# Patient Record
Sex: Female | Born: 1950 | Race: White | Hispanic: No | Marital: Married | Smoking: Former smoker
Health system: Southern US, Community
[De-identification: ages and names within clinical notes are randomized; demographics above are authoritative.]

## PROBLEM LIST (undated history)

## (undated) DIAGNOSIS — E785 Hyperlipidemia, unspecified: Secondary | ICD-10-CM

## (undated) DIAGNOSIS — M1990 Unspecified osteoarthritis, unspecified site: Secondary | ICD-10-CM

## (undated) DIAGNOSIS — I219 Acute myocardial infarction, unspecified: Secondary | ICD-10-CM

## (undated) DIAGNOSIS — J449 Chronic obstructive pulmonary disease, unspecified: Secondary | ICD-10-CM

## (undated) DIAGNOSIS — M109 Gout, unspecified: Secondary | ICD-10-CM

## (undated) DIAGNOSIS — N200 Calculus of kidney: Secondary | ICD-10-CM

## (undated) DIAGNOSIS — I10 Essential (primary) hypertension: Secondary | ICD-10-CM

## (undated) DIAGNOSIS — I4891 Unspecified atrial fibrillation: Secondary | ICD-10-CM

## (undated) DIAGNOSIS — R42 Dizziness and giddiness: Secondary | ICD-10-CM

## (undated) DIAGNOSIS — Z951 Presence of aortocoronary bypass graft: Secondary | ICD-10-CM

## (undated) DIAGNOSIS — I509 Heart failure, unspecified: Secondary | ICD-10-CM

## (undated) DIAGNOSIS — F419 Anxiety disorder, unspecified: Secondary | ICD-10-CM

## (undated) DIAGNOSIS — E669 Obesity, unspecified: Secondary | ICD-10-CM

## (undated) DIAGNOSIS — I2510 Atherosclerotic heart disease of native coronary artery without angina pectoris: Secondary | ICD-10-CM

## (undated) DIAGNOSIS — E119 Type 2 diabetes mellitus without complications: Secondary | ICD-10-CM

## (undated) HISTORY — DX: Heart failure, unspecified: I50.9

## (undated) HISTORY — DX: Obesity, unspecified: E66.9

## (undated) HISTORY — DX: Presence of aortocoronary bypass graft: Z95.1

## (undated) HISTORY — DX: Unspecified osteoarthritis, unspecified site: M19.90

## (undated) HISTORY — DX: Acute myocardial infarction, unspecified: I21.9

## (undated) HISTORY — DX: Dizziness and giddiness: R42

## (undated) HISTORY — DX: Chronic obstructive pulmonary disease, unspecified: J44.9

## (undated) HISTORY — PX: TUBAL LIGATION: SHX77

## (undated) HISTORY — DX: Essential (primary) hypertension: I10

## (undated) HISTORY — DX: Type 2 diabetes mellitus without complications: E11.9

## (undated) HISTORY — DX: Unspecified atrial fibrillation: I48.91

## (undated) HISTORY — DX: Hyperlipidemia, unspecified: E78.5

## (undated) HISTORY — DX: Atherosclerotic heart disease of native coronary artery without angina pectoris: I25.10

## (undated) HISTORY — PX: OTHER SURGICAL HISTORY: SHX169

## (undated) HISTORY — DX: Anxiety disorder, unspecified: F41.9

## (undated) HISTORY — DX: Calculus of kidney: N20.0

---

## 1999-09-08 ENCOUNTER — Inpatient Hospital Stay (HOSPITAL_COMMUNITY): Admission: EM | Admit: 1999-09-08 | Discharge: 1999-09-11 | Payer: Self-pay | Admitting: *Deleted

## 1999-09-08 ENCOUNTER — Encounter: Payer: Self-pay | Admitting: *Deleted

## 1999-12-01 ENCOUNTER — Encounter: Payer: Self-pay | Admitting: Emergency Medicine

## 1999-12-02 ENCOUNTER — Inpatient Hospital Stay (HOSPITAL_COMMUNITY): Admission: EM | Admit: 1999-12-02 | Discharge: 1999-12-04 | Payer: Self-pay | Admitting: Emergency Medicine

## 2000-11-13 ENCOUNTER — Encounter: Payer: Self-pay | Admitting: Family Medicine

## 2000-11-13 ENCOUNTER — Encounter: Admission: RE | Admit: 2000-11-13 | Discharge: 2000-11-13 | Payer: Self-pay | Admitting: Family Medicine

## 2001-12-14 ENCOUNTER — Inpatient Hospital Stay (HOSPITAL_COMMUNITY): Admission: EM | Admit: 2001-12-14 | Discharge: 2001-12-25 | Payer: Self-pay | Admitting: Emergency Medicine

## 2001-12-14 ENCOUNTER — Encounter: Payer: Self-pay | Admitting: Emergency Medicine

## 2001-12-15 HISTORY — PX: CORONARY ARTERY BYPASS GRAFT: SHX141

## 2001-12-20 ENCOUNTER — Encounter: Payer: Self-pay | Admitting: Surgery

## 2001-12-21 ENCOUNTER — Encounter: Payer: Self-pay | Admitting: Surgery

## 2001-12-22 ENCOUNTER — Encounter: Payer: Self-pay | Admitting: Surgery

## 2002-02-20 ENCOUNTER — Encounter: Payer: Self-pay | Admitting: Emergency Medicine

## 2002-02-20 ENCOUNTER — Emergency Department (HOSPITAL_COMMUNITY): Admission: EM | Admit: 2002-02-20 | Discharge: 2002-02-20 | Payer: Self-pay | Admitting: Emergency Medicine

## 2002-11-05 ENCOUNTER — Emergency Department (HOSPITAL_COMMUNITY): Admission: EM | Admit: 2002-11-05 | Discharge: 2002-11-05 | Payer: Self-pay | Admitting: Emergency Medicine

## 2002-11-05 ENCOUNTER — Encounter: Payer: Self-pay | Admitting: Emergency Medicine

## 2003-06-16 ENCOUNTER — Other Ambulatory Visit: Admission: RE | Admit: 2003-06-16 | Discharge: 2003-06-16 | Payer: Self-pay | Admitting: Obstetrics and Gynecology

## 2003-07-05 ENCOUNTER — Encounter (INDEPENDENT_AMBULATORY_CARE_PROVIDER_SITE_OTHER): Payer: Self-pay

## 2003-07-05 ENCOUNTER — Ambulatory Visit (HOSPITAL_COMMUNITY): Admission: RE | Admit: 2003-07-05 | Discharge: 2003-07-05 | Payer: Self-pay | Admitting: Obstetrics and Gynecology

## 2007-09-27 ENCOUNTER — Inpatient Hospital Stay (HOSPITAL_COMMUNITY): Admission: EM | Admit: 2007-09-27 | Discharge: 2007-09-28 | Payer: Self-pay | Admitting: Emergency Medicine

## 2007-09-27 ENCOUNTER — Ambulatory Visit: Payer: Self-pay | Admitting: Cardiology

## 2009-01-29 ENCOUNTER — Inpatient Hospital Stay (HOSPITAL_COMMUNITY): Admission: EM | Admit: 2009-01-29 | Discharge: 2009-01-31 | Payer: Self-pay | Admitting: Emergency Medicine

## 2009-01-29 ENCOUNTER — Ambulatory Visit: Payer: Self-pay | Admitting: Cardiology

## 2009-01-30 ENCOUNTER — Encounter (INDEPENDENT_AMBULATORY_CARE_PROVIDER_SITE_OTHER): Payer: Self-pay | Admitting: Internal Medicine

## 2009-12-05 ENCOUNTER — Emergency Department (HOSPITAL_COMMUNITY): Admission: EM | Admit: 2009-12-05 | Discharge: 2009-12-05 | Payer: Self-pay | Admitting: Emergency Medicine

## 2010-10-22 ENCOUNTER — Inpatient Hospital Stay (HOSPITAL_COMMUNITY)
Admission: EM | Admit: 2010-10-22 | Discharge: 2010-10-25 | Payer: Self-pay | Source: Home / Self Care | Admitting: Emergency Medicine

## 2010-10-22 ENCOUNTER — Ambulatory Visit: Payer: Self-pay | Admitting: Cardiology

## 2010-10-23 ENCOUNTER — Encounter: Payer: Self-pay | Admitting: Cardiology

## 2010-10-23 ENCOUNTER — Ambulatory Visit: Payer: Self-pay | Admitting: Vascular Surgery

## 2010-10-28 ENCOUNTER — Ambulatory Visit: Payer: Self-pay | Admitting: Cardiology

## 2010-10-30 ENCOUNTER — Telehealth: Payer: Self-pay | Admitting: Cardiology

## 2010-10-30 LAB — CONVERTED CEMR LAB
Chloride: 104 meq/L (ref 96–112)
GFR calc non Af Amer: 50.72 mL/min (ref >60)
Glucose, Bld: 71 mg/dL (ref 70–99)
Potassium: 4.3 meq/L (ref 3.5–5.1)
Sodium: 139 meq/L (ref 135–145)

## 2010-11-12 ENCOUNTER — Encounter: Payer: Self-pay | Admitting: Cardiology

## 2010-11-27 DIAGNOSIS — I10 Essential (primary) hypertension: Secondary | ICD-10-CM

## 2010-11-27 DIAGNOSIS — R42 Dizziness and giddiness: Secondary | ICD-10-CM

## 2010-11-27 DIAGNOSIS — E669 Obesity, unspecified: Secondary | ICD-10-CM

## 2010-11-27 DIAGNOSIS — E785 Hyperlipidemia, unspecified: Secondary | ICD-10-CM | POA: Insufficient documentation

## 2010-11-27 DIAGNOSIS — J441 Chronic obstructive pulmonary disease with (acute) exacerbation: Secondary | ICD-10-CM

## 2010-11-27 DIAGNOSIS — E1129 Type 2 diabetes mellitus with other diabetic kidney complication: Secondary | ICD-10-CM

## 2010-11-27 DIAGNOSIS — I4891 Unspecified atrial fibrillation: Secondary | ICD-10-CM | POA: Insufficient documentation

## 2010-11-27 DIAGNOSIS — I2510 Atherosclerotic heart disease of native coronary artery without angina pectoris: Secondary | ICD-10-CM

## 2010-11-27 DIAGNOSIS — M549 Dorsalgia, unspecified: Secondary | ICD-10-CM

## 2010-11-27 DIAGNOSIS — R0602 Shortness of breath: Secondary | ICD-10-CM

## 2010-11-27 DIAGNOSIS — F411 Generalized anxiety disorder: Secondary | ICD-10-CM

## 2010-11-27 DIAGNOSIS — I5023 Acute on chronic systolic (congestive) heart failure: Secondary | ICD-10-CM

## 2010-11-27 DIAGNOSIS — I219 Acute myocardial infarction, unspecified: Secondary | ICD-10-CM | POA: Insufficient documentation

## 2010-11-28 ENCOUNTER — Ambulatory Visit: Payer: Self-pay | Admitting: Cardiology

## 2011-01-16 NOTE — Progress Notes (Signed)
Summary: pt returned call  Phone Note Call from Patient   Caller: Patient [phone] Reason for Call: Talk to Nurse Summary of Call: pt returned call Initial call taken by: Glynda Jaeger,  October 30, 2010 11:38 AM  Follow-up for Phone Call        Phone Call Completed PT AWARE OF BMET RESULTS. Follow-up by: Scherrie Bateman, LPN,  October 30, 2010 11:55 AM

## 2011-01-27 ENCOUNTER — Encounter (INDEPENDENT_AMBULATORY_CARE_PROVIDER_SITE_OTHER): Payer: Medicaid Other | Admitting: Cardiology

## 2011-01-27 ENCOUNTER — Encounter: Payer: Self-pay | Admitting: Cardiology

## 2011-01-27 ENCOUNTER — Other Ambulatory Visit: Payer: Self-pay | Admitting: Cardiology

## 2011-01-27 DIAGNOSIS — E7800 Pure hypercholesterolemia, unspecified: Secondary | ICD-10-CM

## 2011-01-27 DIAGNOSIS — I10 Essential (primary) hypertension: Secondary | ICD-10-CM

## 2011-01-27 DIAGNOSIS — E785 Hyperlipidemia, unspecified: Secondary | ICD-10-CM

## 2011-01-27 DIAGNOSIS — I679 Cerebrovascular disease, unspecified: Secondary | ICD-10-CM | POA: Insufficient documentation

## 2011-01-27 DIAGNOSIS — I2510 Atherosclerotic heart disease of native coronary artery without angina pectoris: Secondary | ICD-10-CM

## 2011-01-28 LAB — HEPATIC FUNCTION PANEL
Albumin: 3.5 g/dL (ref 3.5–5.2)
Total Bilirubin: 0.6 mg/dL (ref 0.3–1.2)

## 2011-01-28 LAB — LIPID PANEL
HDL: 36.3 mg/dL — ABNORMAL LOW (ref >39.00)
LDL Cholesterol: 89 mg/dL (ref 0–99)
Total CHOL/HDL Ratio: 4
Triglycerides: 150 mg/dL — ABNORMAL HIGH (ref 0.0–149.0)

## 2011-01-28 LAB — BASIC METABOLIC PANEL
CO2: 29 mEq/L (ref 19–32)
Chloride: 106 mEq/L (ref 96–112)
Creatinine, Ser: 1.3 mg/dL — ABNORMAL HIGH (ref 0.4–1.2)
Glucose, Bld: 82 mg/dL (ref 70–99)

## 2011-02-05 NOTE — Assessment & Plan Note (Signed)
Summary: eph.gd/per Bjorn Loser pa/gd rs per pt call-mb-mj/d.miller   CC:  follow up .  History of Present Illness: 60 year old female with past medical history of coronary artery disease status post coronary bypass graft and atrial fibrillation for followup. Patient was admitted in Nov 2011 with atrial fibrillation with a rapid ventricular response. Troponin mildly increased. TSH normal. Echocardiogram in November of 2011 showed an ejection fraction of 45-50% with mild mitral regurgitation. Cardiac catheterization in Nov 2011 revealed an ejection fraction of 50%, severe three-vessel coronary artery disease status post 6-vessel coronary artery bypass graft with 6 patent bypass grafts. Severe disease in the proximal portion of the previously stented circumflex vessel that is unchanged from films in 2008 but is unprotected by a vein graft. Medical therapy was recommended. She had carotid Dopplers performed in Nov 2011, which showed right ICA 60- 79% stenosis at the upper end of the range and left ICA 60-79% stenosis at the lower end of the range. The patient declined Coumadin because of financial issues. Since discharge the patient denies any dyspnea on exertion, orthopnea, PND, pedal edema, palpitations, syncope or chest pain.   Current Medications (verified): 1)  Metoprolol Tartrate 50 Mg Tabs (Metoprolol Tartrate) .Marland Kitchen.. 1 1/2  Tab By Mouth Once Daily 2)  Glipizide 5 Mg Tabs (Glipizide) .... 2  Tabs By Mouth Two Times A Day 3)  Enalapril Maleate 20 Mg Tabs (Enalapril Maleate) .Marland Kitchen.. 1 Tab By Mouth Once Daily 4)  Lovastatin 20 Mg Tabs (Lovastatin) .... Take One Tablet By Mouth Daily At Bedtime 5)  Aspirin Ec 325 Mg Tbec (Aspirin) .... Take One Tablet By Mouth Daily 6)  Multivitamins   Tabs (Multiple Vitamin) .Marland Kitchen.. 1  Tab By Mouth Once Daily 7)  Furosemide 40 Mg Tabs (Furosemide) .... 1/2 To 1 Tab By Mouth Once Daily  Past History:  Past Medical History: CAD  ATRIAL FIBRILLATION   CHF HYPERLIPIDEMIA HYPERTENSION  MI  DM  COPD VERTIGO  ANXIETY  OBESITY  Past Surgical History: Reviewed history from 11/27/2010 and no changes required.  Status post coronary artery bypass graft x6, January 2003,  left internal mammary artery to left anterior descending, vein graft to the diagonal, sequential vein graft to the obtuse  marginal 1, obtuse marginal 2, and obtuse marginal  3, vein graft   to the posterior descending artery.   .Status post dilation and curettage.   Social History: Reviewed history from 11/27/2010 and no changes required.  She lives in [place] with her husband.  She does not   currently work.  She has a 30-pack-year history of tobacco abuse,   quitting in the late 1990s.  She denies alcohol or drugs.  Not routinely   exercising.   Review of Systems       no fevers or chills, productive cough, hemoptysis, dysphasia, odynophagia, melena, hematochezia, dysuria, hematuria, rash, seizure activity, orthopnea, PND, pedal edema, claudication. Remaining systems are negative.   Vital Signs:  Patient profile:   60 year old female Height:      67 inches Weight:      249 pounds BMI:     39.14 Pulse rate:   60 / minute Resp:     14 per minute BP sitting:   134 / 80  (left arm)  Vitals Entered By: Kem Parkinson (January 27, 2011 2:47 PM)  Physical Exam  General:  Well-developed obese in no acute distress.  Skin is warm and dry.  HEENT is normal.  Neck is supple. No thyromegaly.  Chest  is clear to auscultation with normal expansion.  Cardiovascular exam is regular rate and rhythm.  Abdominal exam nontender or distended. No masses palpated. Extremities show no edema. neuro grossly intact    EKG  Procedure date:  01/27/2011  Findings:      Sinus rhythm at a rate of 64. Axis normal. Nonspecific ST changes. Cannot rule out prior septal infarct.  Impression & Recommendations:  Problem # 1:  CAD (ICD-414.00) Continue aspirin, beta  blocker, ACE inhibitor and statin. Her updated medication list for this problem includes:    Metoprolol Tartrate 50 Mg Tabs (Metoprolol tartrate) .Marland Kitchen... 1 1/2  tab by mouth once daily    Enalapril Maleate 20 Mg Tabs (Enalapril maleate) .Marland Kitchen... 1 tab by mouth once daily    Aspirin Ec 325 Mg Tbec (Aspirin) .Marland Kitchen... Take one tablet by mouth daily  Her updated medication list for this problem includes:    Metoprolol Tartrate 50 Mg Tabs (Metoprolol tartrate) .Marland Kitchen... 1 1/2  tab by mouth once daily    Enalapril Maleate 20 Mg Tabs (Enalapril maleate) .Marland Kitchen... 1 tab by mouth once daily    Aspirin Ec 325 Mg Tbec (Aspirin) .Marland Kitchen... Take one tablet by mouth daily  Problem # 2:  ATRIAL FIBRILLATION (ICD-427.31) Patient remains in sinus rhythm. Continue beta blocker and aspirin. I have recommended Coumadin and she declines. She understands the risk of an embolic CVA. Her updated medication list for this problem includes:    Metoprolol Tartrate 50 Mg Tabs (Metoprolol tartrate) .Marland Kitchen... 1 1/2  tab by mouth once daily    Aspirin Ec 325 Mg Tbec (Aspirin) .Marland Kitchen... Take one tablet by mouth daily  Problem # 3:  HYPERLIPIDEMIA (ICD-272.4) Continue statin. Check lipids and liver. Her updated medication list for this problem includes:    Lovastatin 20 Mg Tabs (Lovastatin) .Marland Kitchen... Take one tablet by mouth daily at bedtime  Orders: TLB-Lipid Panel (80061-LIPID) TLB-Hepatic/Liver Function Pnl (80076-HEPATIC)  Problem # 4:  HYPERTENSION (ICD-401.9) Blood pressure controlled. Continue present medications. Check potassium and renal function. Her updated medication list for this problem includes:    Metoprolol Tartrate 50 Mg Tabs (Metoprolol tartrate) .Marland Kitchen... 1 1/2  tab by mouth once daily    Enalapril Maleate 20 Mg Tabs (Enalapril maleate) .Marland Kitchen... 1 tab by mouth once daily    Aspirin Ec 325 Mg Tbec (Aspirin) .Marland Kitchen... Take one tablet by mouth daily    Furosemide 40 Mg Tabs (Furosemide) .Marland Kitchen... 1/2 to 1 tab by mouth once daily  Orders: TLB-BMP  (Basic Metabolic Panel-BMET) (80048-METABOL)  Problem # 5:  CHF (ICD-428.0) History of diastolic congestive heart failure. Euvolemic on examination. Continue present dose of diuretic. Her updated medication list for this problem includes:    Metoprolol Tartrate 50 Mg Tabs (Metoprolol tartrate) .Marland Kitchen... 1 1/2  tab by mouth once daily    Enalapril Maleate 20 Mg Tabs (Enalapril maleate) .Marland Kitchen... 1 tab by mouth once daily    Aspirin Ec 325 Mg Tbec (Aspirin) .Marland Kitchen... Take one tablet by mouth daily    Furosemide 40 Mg Tabs (Furosemide) .Marland Kitchen... 1/2 to 1 tab by mouth once daily  Problem # 6:  DM (ICD-250.00) Management per primary care. Her updated medication list for this problem includes:    Glipizide 5 Mg Tabs (Glipizide) .Marland Kitchen... 2  tabs by mouth two times a day    Enalapril Maleate 20 Mg Tabs (Enalapril maleate) .Marland Kitchen... 1 tab by mouth once daily    Aspirin Ec 325 Mg Tbec (Aspirin) .Marland Kitchen... Take one tablet by mouth daily  Problem # 7:  COPD (ICD-496)  Problem # 8:  CEREBROVASCULAR DISEASE (ICD-437.9) Continued aspirin and statin. Followup carotid Dopplers in May 2012.  Patient Instructions: 1)  Your physician wants you to follow-up in:6 MONTHS   You will receive a reminder letter in the mail two months in advance. If you don't receive a letter, please call our office to schedule the follow-up appointment.

## 2011-02-07 ENCOUNTER — Encounter: Payer: Self-pay | Admitting: Cardiology

## 2011-02-10 ENCOUNTER — Ambulatory Visit: Payer: Self-pay | Admitting: Cardiology

## 2011-02-26 LAB — GLUCOSE, CAPILLARY
Glucose-Capillary: 125 mg/dL — ABNORMAL HIGH (ref 70–99)
Glucose-Capillary: 176 mg/dL — ABNORMAL HIGH (ref 70–99)
Glucose-Capillary: 180 mg/dL — ABNORMAL HIGH (ref 70–99)
Glucose-Capillary: 214 mg/dL — ABNORMAL HIGH (ref 70–99)
Glucose-Capillary: 225 mg/dL — ABNORMAL HIGH (ref 70–99)
Glucose-Capillary: 235 mg/dL — ABNORMAL HIGH (ref 70–99)
Glucose-Capillary: 277 mg/dL — ABNORMAL HIGH (ref 70–99)

## 2011-02-26 LAB — POCT I-STAT, CHEM 8
HCT: 35 % — ABNORMAL LOW (ref 36.0–46.0)
Hemoglobin: 11.9 g/dL — ABNORMAL LOW (ref 12.0–15.0)
Potassium: 4.7 mEq/L (ref 3.5–5.1)
Sodium: 140 mEq/L (ref 135–145)

## 2011-02-26 LAB — CBC
HCT: 31.5 % — ABNORMAL LOW (ref 36.0–46.0)
HCT: 31.6 % — ABNORMAL LOW (ref 36.0–46.0)
HCT: 32 % — ABNORMAL LOW (ref 36.0–46.0)
HCT: 33.6 % — ABNORMAL LOW (ref 36.0–46.0)
Hemoglobin: 10.7 g/dL — ABNORMAL LOW (ref 12.0–15.0)
Hemoglobin: 10.8 g/dL — ABNORMAL LOW (ref 12.0–15.0)
Hemoglobin: 11.5 g/dL — ABNORMAL LOW (ref 12.0–15.0)
MCHC: 33.9 g/dL (ref 30.0–36.0)
MCV: 89.8 fL (ref 78.0–100.0)
RBC: 3.63 MIL/uL — ABNORMAL LOW (ref 3.87–5.11)
RDW: 14.5 % (ref 11.5–15.5)
RDW: 14.7 % (ref 11.5–15.5)
WBC: 7.4 10*3/uL (ref 4.0–10.5)
WBC: 7.4 10*3/uL (ref 4.0–10.5)
WBC: 7.5 10*3/uL (ref 4.0–10.5)
WBC: 8.5 10*3/uL (ref 4.0–10.5)

## 2011-02-26 LAB — BASIC METABOLIC PANEL
GFR calc Af Amer: >60 mL/min (ref >60)
GFR calc non Af Amer: 47 mL/min — ABNORMAL LOW (ref >60)
GFR calc non Af Amer: 54 mL/min — ABNORMAL LOW (ref >60)
Glucose, Bld: 234 mg/dL — ABNORMAL HIGH (ref 70–99)
Potassium: 4.5 mEq/L (ref 3.5–5.1)
Potassium: 4.9 mEq/L (ref 3.5–5.1)
Sodium: 135 mEq/L (ref 135–145)
Sodium: 137 mEq/L (ref 135–145)

## 2011-02-26 LAB — COMPREHENSIVE METABOLIC PANEL
ALT: 17 U/L (ref 0–35)
Albumin: 2.9 g/dL — ABNORMAL LOW (ref 3.5–5.2)
Alkaline Phosphatase: 90 U/L (ref 39–117)
Glucose, Bld: 184 mg/dL — ABNORMAL HIGH (ref 70–99)
Potassium: 4.2 mEq/L (ref 3.5–5.1)
Sodium: 138 mEq/L (ref 135–145)
Total Protein: 6.5 g/dL (ref 6.0–8.3)

## 2011-02-26 LAB — TROPONIN I: Troponin I: 0.02 ng/mL (ref 0.00–0.06)

## 2011-02-26 LAB — CARDIAC PANEL(CRET KIN+CKTOT+MB+TROPI)
CK, MB: 2.8 ng/mL (ref 0.3–4.0)
CK, MB: 3.4 ng/mL (ref 0.3–4.0)
CK, MB: 5.2 ng/mL — ABNORMAL HIGH (ref 0.3–4.0)
Troponin I: 0.26 ng/mL — ABNORMAL HIGH (ref 0.00–0.06)
Troponin I: 0.34 ng/mL — ABNORMAL HIGH (ref 0.00–0.06)

## 2011-02-26 LAB — HEMOGLOBIN A1C
Hgb A1c MFr Bld: 7.9 % — ABNORMAL HIGH (ref <5.7)
Mean Plasma Glucose: 180 mg/dL — ABNORMAL HIGH (ref <117)

## 2011-02-26 LAB — PROTIME-INR: Prothrombin Time: 14.3 seconds (ref 11.6–15.2)

## 2011-02-26 LAB — LIPID PANEL
HDL: 35 mg/dL — ABNORMAL LOW (ref >39)
Total CHOL/HDL Ratio: 3.8 RATIO
VLDL: 66 mg/dL — ABNORMAL HIGH (ref 0–40)

## 2011-02-26 LAB — HEPARIN LEVEL (UNFRACTIONATED)
Heparin Unfractionated: 0.19 IU/mL — ABNORMAL LOW (ref 0.30–0.70)
Heparin Unfractionated: 0.46 IU/mL (ref 0.30–0.70)

## 2011-02-26 LAB — CK TOTAL AND CKMB (NOT AT ARMC)
CK, MB: 2.7 ng/mL (ref 0.3–4.0)
Total CK: 72 U/L (ref 7–177)

## 2011-03-17 LAB — POCT CARDIAC MARKERS
CKMB, poc: 4.1 ng/mL (ref 1.0–8.0)
Troponin i, poc: <0.05 ng/mL (ref 0.00–0.09)

## 2011-03-17 LAB — D-DIMER, QUANTITATIVE: D-Dimer, Quant: 0.57 ug/mL-FEU — ABNORMAL HIGH (ref 0.00–0.48)

## 2011-03-17 LAB — DIFFERENTIAL
Eosinophils Absolute: 0.2 10*3/uL (ref 0.0–0.7)
Eosinophils Relative: 2 % (ref 0–5)
Lymphocytes Relative: 23 % (ref 12–46)
Lymphs Abs: 1.9 10*3/uL (ref 0.7–4.0)
Monocytes Relative: 6 % (ref 3–12)

## 2011-03-17 LAB — CBC
MCHC: 34.2 g/dL (ref 30.0–36.0)
MCV: 92.8 fL (ref 78.0–100.0)
Platelets: 184 10*3/uL (ref 150–400)
RDW: 15.9 % — ABNORMAL HIGH (ref 11.5–15.5)

## 2011-03-17 LAB — COMPREHENSIVE METABOLIC PANEL
ALT: 36 U/L — ABNORMAL HIGH (ref 0–35)
AST: 29 U/L (ref 0–37)
Calcium: 9.2 mg/dL (ref 8.4–10.5)
Creatinine, Ser: 1.05 mg/dL (ref 0.4–1.2)
GFR calc Af Amer: >60 mL/min (ref >60)
Sodium: 139 mEq/L (ref 135–145)
Total Protein: 7.5 g/dL (ref 6.0–8.3)

## 2011-03-17 LAB — GLUCOSE, CAPILLARY
Glucose-Capillary: 56 mg/dL — ABNORMAL LOW (ref 70–99)
Glucose-Capillary: 82 mg/dL (ref 70–99)

## 2011-04-01 LAB — URINE MICROSCOPIC-ADD ON

## 2011-04-01 LAB — POCT I-STAT 3, ART BLOOD GAS (G3+)
O2 Saturation: 99 %
Patient temperature: 98.6
pO2, Arterial: 118 mmHg — ABNORMAL HIGH (ref 80.0–100.0)

## 2011-04-01 LAB — BASIC METABOLIC PANEL
BUN: 27 mg/dL — ABNORMAL HIGH (ref 6–23)
CO2: 26 mEq/L (ref 19–32)
Chloride: 102 mEq/L (ref 96–112)
Creatinine, Ser: 1.14 mg/dL (ref 0.4–1.2)
Glucose, Bld: 131 mg/dL — ABNORMAL HIGH (ref 70–99)

## 2011-04-01 LAB — CBC
HCT: 34.7 % — ABNORMAL LOW (ref 36.0–46.0)
HCT: 33 % — ABNORMAL LOW (ref 36.0–46.0)
Hemoglobin: 12 g/dL (ref 12.0–15.0)
Hemoglobin: 11.5 g/dL — ABNORMAL LOW (ref 12.0–15.0)
MCHC: 34.6 g/dL (ref 30.0–36.0)
MCHC: 34.8 g/dL (ref 30.0–36.0)
MCV: 91.9 fL (ref 78.0–100.0)
MCV: 90.2 fL (ref 78.0–100.0)
Platelets: 174 10*3/uL (ref 150–400)
Platelets: 167 10*3/uL (ref 150–400)
RBC: 3.77 MIL/uL — ABNORMAL LOW (ref 3.87–5.11)
RBC: 3.66 MIL/uL — ABNORMAL LOW (ref 3.87–5.11)
RDW: 15.7 % — ABNORMAL HIGH (ref 11.5–15.5)
RDW: 16.1 % — ABNORMAL HIGH (ref 11.5–15.5)
WBC: 7.4 10*3/uL (ref 4.0–10.5)
WBC: 6.8 10*3/uL (ref 4.0–10.5)

## 2011-04-01 LAB — COMPREHENSIVE METABOLIC PANEL WITH GFR
ALT: 29 U/L (ref 0–35)
AST: 25 U/L (ref 0–37)
Albumin: 2.8 g/dL — ABNORMAL LOW (ref 3.5–5.2)
Alkaline Phosphatase: 86 U/L (ref 39–117)
BUN: 29 mg/dL — ABNORMAL HIGH (ref 6–23)
CO2: 27 meq/L (ref 19–32)
Calcium: 8.5 mg/dL (ref 8.4–10.5)
Chloride: 103 meq/L (ref 96–112)
Creatinine, Ser: 1.22 mg/dL — ABNORMAL HIGH (ref 0.4–1.2)
GFR calc non Af Amer: 45 mL/min — ABNORMAL LOW
Glucose, Bld: 209 mg/dL — ABNORMAL HIGH (ref 70–99)
Potassium: 4.2 meq/L (ref 3.5–5.1)
Sodium: 139 meq/L (ref 135–145)
Total Bilirubin: 0.7 mg/dL (ref 0.3–1.2)
Total Protein: 6.3 g/dL (ref 6.0–8.3)

## 2011-04-01 LAB — CARDIAC PANEL(CRET KIN+CKTOT+MB+TROPI)
CK, MB: 2.5 ng/mL (ref 0.3–4.0)
CK, MB: 1.8 ng/mL (ref 0.3–4.0)
Relative Index: INVALID (ref 0.0–2.5)
Relative Index: INVALID (ref 0.0–2.5)
Total CK: 59 U/L (ref 7–177)
Total CK: 47 U/L (ref 7–177)

## 2011-04-01 LAB — GLUCOSE, CAPILLARY
Glucose-Capillary: 200 mg/dL — ABNORMAL HIGH (ref 70–99)
Glucose-Capillary: 238 mg/dL — ABNORMAL HIGH (ref 70–99)
Glucose-Capillary: 141 mg/dL — ABNORMAL HIGH (ref 70–99)
Glucose-Capillary: 161 mg/dL — ABNORMAL HIGH (ref 70–99)

## 2011-04-01 LAB — COMPREHENSIVE METABOLIC PANEL
BUN: 25 mg/dL — ABNORMAL HIGH (ref 6–23)
CO2: 27 mEq/L (ref 19–32)
Chloride: 106 mEq/L (ref 96–112)
Creatinine, Ser: 1.15 mg/dL (ref 0.4–1.2)

## 2011-04-01 LAB — URINALYSIS, ROUTINE W REFLEX MICROSCOPIC
Glucose, UA: NEGATIVE mg/dL
Ketones, ur: NEGATIVE mg/dL
Nitrite: NEGATIVE
Specific Gravity, Urine: 1.014 (ref 1.005–1.030)
pH: 5.5 (ref 5.0–8.0)

## 2011-04-01 LAB — DIFFERENTIAL
Basophils Relative: 0 % (ref 0–1)
Eosinophils Absolute: 0.2 10*3/uL (ref 0.0–0.7)
Eosinophils Relative: 2 % (ref 0–5)
Lymphs Abs: 1.6 10*3/uL (ref 0.7–4.0)
Monocytes Absolute: 0.4 10*3/uL (ref 0.1–1.0)
Monocytes Relative: 5 % (ref 3–12)
Neutrophils Relative %: 71 % (ref 43–77)

## 2011-04-01 LAB — POCT CARDIAC MARKERS: Troponin i, poc: <0.05 ng/mL (ref 0.00–0.09)

## 2011-04-01 LAB — CK TOTAL AND CKMB (NOT AT ARMC): CK, MB: 2.5 ng/mL (ref 0.3–4.0)

## 2011-04-01 LAB — HEMOGLOBIN A1C
Hgb A1c MFr Bld: 7.3 % — ABNORMAL HIGH (ref 4.6–6.1)
Mean Plasma Glucose: 163 mg/dL

## 2011-04-01 LAB — D-DIMER, QUANTITATIVE

## 2011-04-01 LAB — BRAIN NATRIURETIC PEPTIDE
Pro B Natriuretic peptide (BNP): 292 pg/mL — ABNORMAL HIGH (ref 0.0–100.0)
Pro B Natriuretic peptide (BNP): 180 pg/mL — ABNORMAL HIGH (ref 0.0–100.0)

## 2011-04-24 ENCOUNTER — Telehealth: Payer: Self-pay | Admitting: Cardiology

## 2011-04-24 NOTE — Telephone Encounter (Signed)
Spoke with pt, per last office note from dr Jens Som, pt will have carotid in may. appt made Cassandra Fisher

## 2011-04-24 NOTE — Telephone Encounter (Signed)
Dose pt need to have a carotid study

## 2011-04-29 NOTE — Discharge Summary (Signed)
Cassandra Fisher, Cassandra Fisher                ACCOUNT NO.:  [id number]   MEDICAL RECORD NO.:  [id number]          PATIENT TYPE:  INP   LOCATION:  3731                         FACILITY:  [facility]   PHYSICIAN:  Michelene Gardener, MD    DATE OF BIRTH:  July 04, 1951   DATE OF ADMISSION:  01/29/2009  DATE OF DISCHARGE:  01/31/2009                               DISCHARGE SUMMARY   DISCHARGE DIAGNOSES:  1. Shortness of breath which is multifactorial and most likely related      to mild diastolic congestive heart failure.  2. Questionable chronic obstructive pulmonary disease.  3. History of coronary artery disease status post coronary artery      bypass grafting.  4. Diabetes mellitus.  5. Hypertension.  6. Hyperlipidemia.  7. Obesity.  8. Questionable obstructive sleep apnea.  9. Urinary tract infection.   DISCHARGE MEDICATIONS:  New medications:  1. Albuterol inhaler 2 puffs q.4 h. as needed.  2. Ciprofloxacin 500 mg twice daily x5 days.   Old medications:  1. Lovastatin 20 mg once a day.  2. Potassium chloride 10 mEq once a day.  3. Metoprolol 50 mg twice daily.  4. Enalapril 20 mg once a day.  5. Glipizide 5 mg twice daily.  6. Metformin 500 mg twice daily.  7. Aspirin 325 mg once a day.  8. Multivitamin 1 tablet once a day.  9. Vitamin B12 of 100 mcg once a day.  10.Omega-3 1000 mg once a day.  11.Novolin 16 units in a.m. and 8 units at p.m.  12.Lasix 40 mg once a day.   CONSULTATIONS:  None.   PROCEDURE:  None.   RADIOLOGY STUDIES:  Chest x-ray on January 29, 2009, showed findings  questionable for COPD.  Follow up with primary doctor within 1-2 weeks.   COURSE OF HOSPITALIZATION:  1. Shortness of breath.  This is multifactorial and rated partially to      possible mild congestive heart failure.  There is also a      questionable obstructive sleep apnea and questionable COPD.  This      patient was admitted to the hospital.  Her Lasix were switched to      20 mg IV once a day.  She  was put on breathing treatments.  The      patient did not require oxygen.  Currently at the time of      discharge, she is back to her baseline.  She does not have      shortness of breath.  There is no chest pain.  Three sets of      troponin and cardiac enzymes were done.  Her chest x-ray showed      hyperinflation that might be consistent with COPD, but her clinical      examination did not show any wheezes.  I gave an albuterol inhaler      to be taken as needed.  I recommended her to follow with her doctor      for a possible pulmonary function test to rule out COPD.  I also  recommended sleep study to be done to rule out obstructive sleep      apnea.  2. Urinary tract infection.  The patient was started on Cipro in the      hospital and she was given Cipro for more 5 days.   DISPOSITION:  Otherwise, other medical conditions remained stable in the  hospital.  The patient will be discharged to home today on all  preadmission medications.  She will also have Cipro and albuterol  prescriptions and she will follow with her primary doctor within a week.   TOTAL ASSESSMENT TIME:  40 minutes.      Michelene Gardener, MD  Electronically Signed     NAE/MEDQ  D:  01/31/2009  T:  01/31/2009  Job:  [id number]   cc:   Janetta Hora. Darrick Penna, MD  Madolyn Frieze. Jens Som, MD, [facility]

## 2011-04-29 NOTE — Discharge Summary (Signed)
Cassandra Fisher, Cassandra Fisher                ACCOUNT NO.:  [id number]   MEDICAL RECORD NO.:  [id number]          PATIENT TYPE:  INP   LOCATION:  3731                         FACILITY:  [facility]   PHYSICIAN:  Cassandra Frieze. Jens Som, MD, FACCDATE OF BIRTH:  [birth year]   DATE OF ADMISSION:  09/27/2007  DATE OF DISCHARGE:  09/28/2007                               DISCHARGE SUMMARY   PROCEDURES:  1. Cardiac catheterization.  2. Coronary arteriogram.  3. Left ventriculogram.  4. LIMA arteriogram.  5. SVG angiogram.   TIME AT DISCHARGE:  39 minutes.   PRIMARY FINAL DISCHARGE DIAGNOSIS:  Chest pain, cardiac enzymes negative  for MI and medical therapy for coronary artery disease recommended.   SECONDARY DIAGNOSIS:  1. Diabetes with a hemoglobin A1c of 9.7 this admission  2. Hypertension.  3. Hyperlipidemia.  4. Obesity.  5. Status post aortocoronary bypass surgery in 2003 with LIMA to LAD,      SVG to OM-1, OM-2 and OM-3, SVG to D1, SVG to PDA.  6. Non-ST segment elevation MI in 2003.  7. History of diastolic congestive heart failure prior to her bypass      surgery.  8. Status post percutaneous intervention to the circumflex in      September and December 2000.  9. Anxiety/vertigo.  10.Family history of coronary artery disease in father.   HOSPITAL COURSE:  Cassandra Fisher is a 60 year old female with a history of  coronary artery disease.  Her primary anginal symptom at the time of her  bypass surgery was left arm heaviness.  She had onset of these symptoms  at approximately 11:30 last p.m. She took aspirin and Lasix and slept  some but then was wakened by symptoms at approximately 4:30 a.m. She  also had back pain.  She came to the emergency room where she was  significantly hypertensive with a blood pressure of 190/95.  She was  treated with IV beta blocker with improvement in her symptoms.  She was  admitted for further evaluation.   Her cardiac enzymes were negative for MI.  A hemoglobin A1c  was  performed and described above. It was felt that she needed further  evaluation as she had had no recent cardiac follow-up, so a cardiac  catheterization was performed to further define her anatomy.   The cardiac catheterization showed a separate ostia for the left main.  The LAD was occluded and the first diagonal was occluded.  The LIMA to  LAD was patent and the SVG to diagonal was also patent.  The circumflex  showed 50% In-Stent restenosis and 75% stenosis after the stent.  The OM-  1 was normal, the OM-2 was totaled and it trifurcated.  The vein graft  was patent to all three and they were feeling well.  The RCA was totaled  proximally and the PDA was totaled proximally with the SVG to PDA being  normal.  Her EF was 55% with mild inferior hypokinesis. Dr. Antoine Poche  felt that she had severe native three-vessel disease with patent grafts  and well preserved EF and medical management was the best  option for  possible diffuse distal disease.   On September 28, 2007, she had no chest pain, shortness of breath or arm  heaviness.  The need for compliance with her medications was reinforced.  She was seen by Dr. Jens Fisher and considered stable for discharge with  outpatient follow-up arranged.   DISCHARGE INSTRUCTIONS:  Her activity level is to be increased  gradually.  She is to call our office for any problems with the cath  site.  She is to follow up with Dr. Jens Fisher in [place] on October 31  at 10:15 and with Dr. Vear Clock as needed.   DISCHARGE MEDICATIONS:  1. Metformin 500 mg a.m., 1000 mg p.m., hold till September 30, 2007.  2. Vitamin B12 and Centrum Silver as prior to admission.  3. Furosemide 40 mg a day.  4. Metoprolol 50 mg b.i.d.  5. Glipizide 5 mg 2 tablets b.i.d.  6. Enalapril 20 mg a day.  7. Klor-Con 10 mEq a day.  8. Aspirin 325 mg a day.  9. Novolin-N 10 units a.m., 60 units p.m. as prior to admission.      Theodore Demark, PA-C      Cassandra Frieze. Jens Som,  MD, [facility]  Electronically Signed    RB/MEDQ  D:  09/28/2007  T:  09/29/2007  Job:  [id number]   cc:   Loma Sender

## 2011-04-29 NOTE — H&P (Signed)
Cassandra Fisher, Cassandra Fisher                ACCOUNT NO.:  [id number]   MEDICAL RECORD NO.:  [id number]          PATIENT TYPE:  INP   LOCATION:  3731                         FACILITY:  [facility]   PHYSICIAN:  Massie Maroon, MD        DATE OF BIRTH:  05/06/1951   DATE OF ADMISSION:  01/29/2009  DATE OF DISCHARGE:                              HISTORY & PHYSICAL   CHIEF COMPLAINT:  Shortness of breath.   HISTORY OF PRESENT ILLNESS:  A 60 year old female with a history of  diabetes, CAD status post CABG 2003, non-ST-elevation MI in 2003,  percutaneous intervention to the left circ in August 25, 1999, and  CHF, complains of shortness of breath starting about 7:00 a.m.  She  noticed some slight orthopnea as well as some slight swelling in her  legs over recent days.  She notes 10 pounds of weight gain over the past  month as well as some slight dyspnea on exertion.  She is having  increasing shortness of breath and therefore came to the emergency room.  Her pulse oximetry was 100% on unknown FiO2.  Blood pressure was  slightly elevated at 156/64 and pulse 83.  Chest x-ray was negative for  any acute process, other than COPD changes, hyperinflation, status post  CABG, and stents.  There was no evidence of congestive heart failure or  pleural fluid.  However, BNP was mildly elevated at 292.  There was no  evidence of wheezing on exam by the ER physician.  Her lung sounds were  slightly diminished.  Her initial troponin was less than 0.05.  ABG  showed pH 7.396, PCO2 of 40.3, and PO2 of 118 which was apparently done  on 100% FiO2.  The patient was treated with IV Lasix 40 mg IV x1 as well  as ceftriaxone IV in the ED.  She was also given an aspirin.  The  patient will be admitted for mild CHF versus mild COPD exacerbation.  I  think that her findings are probably more consistent with mild CHF  exacerbation.  There was not a D-dimer done, but there is no evidence to  suggest that she has DVT or PE such as  tachycardia on exam.  In fact,  pulse oximetry is relatively intact.   PAST MEDICAL HISTORY:  1. Diabetes.  2. Hypertension.  3. Hyperlipidemia.  4. Obesity.  5. CAD status post CABG in January 2003 with LIMA to the LAD,      saphenous venous graft to OM-1,OM-2, and OM-3:  Saphenous venous      graft to diagonal #1: and Saphenous venous graft to PDA.  6. History of non-ST-elevation MI at the time of her bypass surgery.  7. History of diastolic heart failure in 2003 before bypass surgery.  8. History of percutaneous intervention to the circumflex in September      2000.  9. Status post HSRA to the circumflex in December 2000.  10.Cardiac catheterization in September 27, 2007, which showed EF 55%      with mild inferior hypokinesis, severe 3-vessel coronary artery  disease, patent grafts, well preserved ejection fraction, medical      management was recommended at that time.  11.Anxiety/vertigo.  12.Family history of CAD in her father.   PAST SURGICAL HISTORY:  D and C and above-stated bypass and cardiac  catheterizations.   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  She was the primary caregiver for her  daughter with spina bifida until her death.  She smoked approximately 28-  pack-year history of tobacco use, but quit in 2000.  She does not use  alcohol or drugs.   FAMILY HISTORY:  Her mother is alive in her late 33s with a history of  diabetes, but no heart disease.  Her father died at age 72 of Bright  disease and heart disease.  She has 1 sister with no history of coronary  artery disease.   REVIEW OF SYSTEMS:  Negative for all 10-organ systems except for  pertinent positives stated above.   ALLERGIES:  No known drug allergies.   MEDICATIONS:  1. Metoprolol 50 mg p.o. b.i.d.  2. Omega-3 fish oil 1-2 p.o. daily.  3. Centrum Cardio multivitamin 1 daily.  4. Vitamin B12 of 1000 mcg daily.  5. Lovastatin 20 mg  p.o. at bedtime.  6. Potassium chloride 10 mEq p.o. daily.  7. Bayer Aspirin 325 mg p.o. daily.  8. Enalapril 20 mg p.o. daily.  9. Glipizide 5 mg 2 p.o. b.i.d.  10.Metformin 500 mg 1 p.o. q.a.m. and 2 p.o. q.p.m.  11.Novolin insulin 60 units subcu q.a.m. and 80 units subcu q.p.m.   PHYSICAL EXAMINATION:  VITAL SIGNS:  Temperature 97.8, pulse 83,  respiratory rate 20, blood pressure 156/64, and pulse oximetry 100% on  room air.  HEENT:  Anicteric, EOMI, no nystagmus, pupils 1.5 mm, symmetric, direct,  consensual reflexes intact.  Mucous membranes moist.  Small oropharynx.  NECK:  No JVD (this is 3 hours after IV Lasix), no bruit, no  thyromegaly.  HEART:  Regular rate and rhythm.  S1 and S2.  No murmurs, gallops, or  rubs.  LUNGS:  Clear to auscultation bilaterally.  ABDOMEN:  Soft, nontender, and nondistended.  Positive bowel sounds.  EXTREMITIES:  No cyanosis, clubbing, or edema.  NEURO.  Cranial nerves II through XII intact, reflexes 2+, symmetric,  diffuse with downgoing toes bilaterally, motor strength 5/5 in all 4  extremities, pinprick intact.  SKIN:  No rashes.  LYMPH NODES:  No adenopathy.   LABORATORY DATA:  PH 7.396, PCO2 of 40.3, and PO2 of 118 on 100% FiO2.  BNP was 292 (elevated), CPK 75, MB 2.5, troponin I 0.01 (at 0925 hours)  Troponin-I less than 0.05 (1000 hours).  Sodium 142, potassium 4.6, chloride 106, bicarb 22, BUN 25, creatinine  1.15, and glucose 173.  WBC 7.4, hemoglobin 12.0, platelet count 174, MCV 91.5, and RDW 15.7.  Urinalysis showed wbc's of 11-20, rbc's 0-2, nitrite negative, and  leukocyte esterase positive.   ASSESSMENT:  1. Dyspnea.  2. Mild congestive heart failure.  3. Chronic obstructive pulmonary disease by chest x-ray.  4. Coronary artery disease status post coronary artery bypass graft in      2003, non-ST-elevation myocardial infarction in 2003, status post      percutaneous intervention of left circumflex September, October       2000.  5. Positive family history of coronary artery disease.  6. Diabetes.  7. Hypertension.  8. Hyperlipidemia.  9. Obesity.  10.Probable sleep apnea.  11.Congestive heart failure (diastolic dysfunction).  12.Urinary tract infection.   1. Dyspnea, probably is likely due to mild CHF.  I was not able to      auscultate her before the Lasix, so it is hard to say.  There may      also be a small component of COPD.  COPD changes were evident      apparently on chest x-ray.  The patient will be treated with IV      Lasix 20 mg IV b.i.d. for now.  We will continue her afterload      reduction with enalapril.  We will continue her metoprolol at 50 mg      p.o. b.i.d.  Since this episode of CHF is more likely due to      diastolic dysfunction and her pulse ox is very reasonable at this      point.  We will continue her on potassium chloride 10 mEq p.o.      daily.  We will use albuterol nebs q.6 h p.r.n.  We will check      daily weight, strict I's and O's and we will check a BMP in the      morning.  We will also cycle cardiac enzymes troponin-I q.8 h x3      sets.  We will also check a cardiac echo in the morning.  2. COPD changes on chest x-ray:  The patient did have an outpatient      PFT with lung volumes DLCO.  For now, we will use albuterol q.6 h      p.r.n. wheezing or shortness of breath.  3. UTI.  The patient will be treated with ciprofloxacin 500 mg p.o.      b.i.d. for UTI.  4. CAD status post CABG in 2003, non-ST-elevation MI in 2003,      percutaneous intervention of left circ in 2000:  We will continue      on her aspirin, lovastatin, enalapril, and metoprolol.  5. Diabetes:  The patient will be continued on her glipizide as well      as her metformin.  We will also continue on her insulin which is      Novolin 60 units in the morning and 80 units in the evening.  We      will also use insulin sliding scale to cover the patient.  6. DVT prophylaxis.  We will use Lovenox 40  mg subcu daily.      Massie Maroon, MD  Electronically Signed     JYK/MEDQ  D:  01/29/2009  T:  01/30/2009  Job:  [id number]   cc:   Janetta Hora. Darrick Penna, MD  Madolyn Frieze. Jens Som, MD, [facility]

## 2011-04-29 NOTE — H&P (Signed)
NAMEMELESSA, COWELL                ACCOUNT NO.:  [id number]   MEDICAL RECORD NO.:  [id number]          PATIENT TYPE:  INP   LOCATION:  3731                         FACILITY:  [facility]   PHYSICIAN:  Madolyn Frieze. Jens Som, MD, FACCDATE OF BIRTH:  July 06, 1951   DATE OF ADMISSION:  09/27/2007  DATE OF DISCHARGE:                              HISTORY & PHYSICAL   PRIMARY CARE PHYSICIAN:  Loma Sender, MD.   PRIMARY CARDIOLOGIST:  Madolyn Frieze. Jens Som, MD, [facility].   CHIEF COMPLAINT:  Possible angina.   HISTORY OF PRESENT ILLNESS:  Ms. Hornbaker is a 60 year old female with  known coronary artery disease.  She had bypass surgery in 2003.  Her  major anginal symptoms were left arm heaviness and back pain.  She has  not had those symptoms since that time until last night.   Last night at approximately 11:30 with minimal activity, she had onset  of left arm heaviness that radiated up into her neck with an aching  feeling as well.  She took an aspirin and her Lasix.  She slept some but  was awakened again by these symptoms at approximately 4:30 a.m.Marland Kitchen  She  got to the ER at approximately 5:10 a.m..  In the emergency room, she  received IV beta blocker because her blood pressure was elevated.  Her  left arm heaviness and jaw aching has resolved.  Recently the patient  notes that when she has checked her blood pressure, it has been fine.  This is a first episode of left arm heaviness she has had since her  bypass surgery.  In general, she has been less active than usual  recently after the death of her daughter a little bit over a year ago.  Currently she is symptom free.   PAST MEDICAL HISTORY:  1. Status post aortocoronary bypass surgery January 2003 with LIMA to      LAD; SVG to OM-1, OM-2, OM-3; SVG to diagonal #1; SVG to PDA.  2. Diabetes.  3. Hypertension.  4. Hyperlipidemia.  5. Obesity.  6. History of non-ST segment elevation MI at the time her bypass      surgery.  7. History of diastolic  heart failure in 2003 before bypass surgery.  8. Status post percutaneous intervention to circumflex in September      2000.  9. Status post HSRA to the circumflex in December 2000.  10.History of anxiety.  11.History of vertigo.   PAST SURGICAL HISTORY:  She is status post cardiac catheterization  bypass surgery and D&C.   ALLERGIES:  No known drug allergies.   MEDICATIONS:  1. Lipitor 40 mg 1/2 tablet daily.  2. Multivitamin daily.  3. Metoprolol 50 mg b.i.d.  4. Lasix 40 mg a day.  5. Glipizide 5 mg 2 tablets b.i.d.  6. B12 daily.  7. Aspirin 325 mg daily.  8. Enalapril 20 mg a day.  9. Klor-Con 10 daily.  10.Novolin 10 units a.m., 16 units p.m.   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  She was the primary caregiver for her  daughter with spina  bifida until her death.  She has approximately a 60-pack-year history of  tobacco use but quit in 2000.  She does not abuse alcohol or drugs.   FAMILY HISTORY:  Her mother is alive in her late 78s with a history of  diabetes but no heart disease.  Her father died at age 30 of Bright's  disease and heart disease.  She has one sister with no history of  coronary artery disease.   REVIEW OF SYSTEMS:  She has insomnia and complains of fatigue.  She has  lower extremity edema at times.  Dyspnea on exertion is chronic.  She  denies coughing or wheezing.  She has occasional numbness in the  fingertips of her left hand and daily numbness in the fingers of her  right hand.  She has some chronic arthralgias.  She has reflux symptoms  of heartburn.  Full 14-point Review of Systems is otherwise negative.   PHYSICAL EXAMINATION:  VITAL SIGNS:  Temperature is 97.8, blood pressure  initially 190/95, pulse 95, respiratory rate 20, O2 saturation 98% on  room air.  GENERAL:  She is a well-developed obese female in no acute distress.  HEENT:  Normal.  NECK:  There is no lymphadenopathy, thyromegaly, bruit or  JVD noted.  CARDIOVASCULAR:  Heart is regular in rate and rhythm with an S1-S2 and  no significant murmur, rub or gallop is noted.  DP pulses are slightly  decreased, but pulses in all four extremities were present, no femoral  bruits appreciated.  LUNGS:  Essentially clear to auscultation bilaterally.  SKIN:  No rashes or lesions are noted.  ABDOMEN:  Soft and nontender with active bowel sounds and no  hepatosplenomegaly.  EXTREMITIES:  There is no cyanosis, clubbing or edema noted.  MUSCULOSKELETAL:  No joint deformity, effusion, spine or CVA tenderness.  NEUROLOGIC:  She is alert and oriented.  Cranial nerves II-XII grossly  intact.   Chest x-ray:  No acute disease.   EKG is sinus rhythm, rate 95, with an incomplete left bundle branch  block noted.   Laboratory values are pending at the time of dictation.  The point-of-  care markers are negative x1.   IMPRESSION:  Unstable anginal pain.  Ms. Schrodt is a 60 year old female  with a history of coronary artery disease and multiple cardiac risk  factors who had pressure at approximately 12:30 a.m. that last about 30  minutes and recurrent pain at 4:00 a.m. that lasted about 45 minutes.  There were no associated symptoms, but her symptoms were similar to  symptoms prior to coronary artery bypass grafting.  She has a new left  bundle branch block, and her symptoms are concerning for unstable  anginal pain.   She will be admitted. Cardiac enzymes will be cycled.  She will be  continued on aspirin, statin  and his beta blocker which will be up  titrated for better blood pressure control.  Heparin will be added.  The  risks and benefits of cardiac catheterization were discussed with the  patient and her husband.  Her pretest probability for progression of  coronary artery disease is high, and, therefore, this is the best test.  The patient and her husband understand the risks and benefits and agree  to proceed.  She will be continued  on her home medication, and a  financial counselor will be asked to see her as the reason she states  she has had no cardiac followup in the last  5 years is because she could  not afford it.      Theodore Demark, PA-C      Madolyn Frieze. Jens Som, MD, [facility]  Electronically Signed    RB/MEDQ  D:  09/27/2007  T:  09/27/2007  Job:  [id number]   cc:   Loma Sender

## 2011-04-29 NOTE — Cardiovascular Report (Signed)
NAMELUNDYNN, COHOON NO.:  [id number]   MEDICAL RECORD NO.:  [id number]          PATIENT TYPE:  INP   LOCATION:  3731                         FACILITY:  [facility]   PHYSICIAN:  Rollene Rotunda, MD, FACCDATE OF BIRTH:  09-24-1951   DATE OF PROCEDURE:  09/27/2007  DATE OF DISCHARGE:                            CARDIAC CATHETERIZATION   PRIMARY CARE PHYSICIAN:  Loma Sender, M.D.  Cardiologist, Dr.  Jens Som.   PROCEDURE:  Left heart catheterization/coronary arteriography.   INDICATIONS:  Evaluate patient with chest pain and previous CABG.   PROCEDURE NOTE:  Left heart catheterization was performed via right  femoral artery.  The artery was cannulated using anterior wall puncture.  A 6-French arterial sheath was inserted via the Seldinger technique.  Preformed Judkins and pigtail catheter were utilized.  The patient  tolerated the procedure well and left the lab in stable condition.   HEMODYNAMIC DATA:  LV 155/12, AO 154/112.   Coronaries:  Left main had separate ostia.  The LAD had 99% followed by  99% proximal stenosis and was occluded in the mid segment.  There was a  large mid diagonal which was occluded at its ostium and seen to fill via  a vein graft.  The apical LAD had diffuse moderate, but nonobstructive  disease.  The circumflex in the AV groove had a proximal stent with mid  diffuse 50% restenosis.  There was 75% stenosis after the stent.  Obtuse  marginal-1 was small and normal.  The obtuse marginal-2 was a very large  vessel occluded at the ostium and a previous stent.  It was seen to be a  trifurcating vessel.  All three branches were grafted by a sequential  vein graft and free of high-grade disease.  The right coronary artery is  dominant vessel and was occluded proximally.  The PDA was occluded at  the ostium.  It was a moderate size to small vessel.   Grafts:  LIMA to the LAD was widely patent.  Saphenous vein graft to  diagonal was widely  patent.  Saphenous vein graft to three branches of  the large mid obtuse marginal was patent with diffuse luminal  irregularities.  The saphenous vein graft to the PDA was normal.   Left ventriculogram:  The left ventriculogram was obtained in the RAO  projection.  The EF was 55% with mild inferior hypokinesis.   CONCLUSION:  Severe three-vessel coronary artery disease.  Patent  grafts.  Well-preserved ejection fraction.   PLAN:  The patient will continue to have medical management and risk  reduction.      Rollene Rotunda, MD, [facility]  Electronically Signed     JH/MEDQ  D:  09/27/2007  T:  09/28/2007  Job:  [id number]   cc:   Loma Sender

## 2011-04-29 NOTE — H&P (Signed)
[name], Cassandra Fisher                ACCOUNT NO.:  [id number]   MEDICAL RECORD NO.:  [id number]          PATIENT TYPE:  INP   LOCATION:  3731                         FACILITY:  [facility]   PHYSICIAN:  Massie Maroon, MD        DATE OF BIRTH:  04-25-1951   DATE OF ADMISSION:  01/29/2009  DATE OF DISCHARGE:                              HISTORY & PHYSICAL   ADDENDUM.   PLAN:  For diabetes, we will stop metformin because of her CHF for now.      Massie Maroon, MD  Electronically Signed     JYK/MEDQ  D:  01/29/2009  T:  01/30/2009  Job:  [id number] [id number]

## 2011-04-30 ENCOUNTER — Encounter (INDEPENDENT_AMBULATORY_CARE_PROVIDER_SITE_OTHER): Payer: Medicaid Other | Admitting: Cardiology

## 2011-04-30 DIAGNOSIS — I6529 Occlusion and stenosis of unspecified carotid artery: Secondary | ICD-10-CM

## 2011-05-02 ENCOUNTER — Encounter: Payer: Self-pay | Admitting: Cardiology

## 2011-05-02 ENCOUNTER — Telehealth: Payer: Self-pay | Admitting: Cardiology

## 2011-05-02 NOTE — Discharge Summary (Signed)
[place]  Patient:    Cassandra Fisher, Cassandra Fisher Visit Number: [id number] MRN: [id number]          Service Type: MED Location: 2000 2037 01 Attending Physician:  Cleatrice Burke Dictated by:   Adair Patter, P.A. Admit Date:  12/14/2001 Discharge Date: 12/25/2001                             Discharge Summary  ADMISSION DIAGNOSIS:  Coronary artery disease.  SECONDARY DIAGNOSES: 1. Non-insulin-dependent diabetes mellitus. 2. Acute renal insufficiency.  DISCHARGE DIAGNOSIS:  Coronary artery disease.  HOSPITAL PROCEDURES: 1. Cardiac catheterization. 2. Coronary artery bypass grafting times six.  HOSPITAL COURSE:  The patient was admitted to [facility] on 12/14/2001 secondary to shortness of breath with bilateral lower extremity swelling, also left sided shoulder pain.  Because of this the patient underwent cardiovascular work-up including cardiac catheterization.  This revealed patient with coronary artery disease amenable to surgical correction. Because of this Dr. Laneta Simmers was consulted and on 12/20/2001 he performed coronary artery bypass grafting times six with left internal mammary artery anastomosed to the left anterior descending artery, ascending sequential saphenous vein graft to the first, second and third obtuse marginal arteries,  saphenous vein graft to the posterior descending artery and saphenous vein graft to the diagonal artery.  No complications were noted during the procedure. Postoperatively the patients hospital course was complicated by poor management of her diabetes mellitus.  This was alleviated by placing the patient on Amaryl which she took previously and the addition of Lantus insulin.  The patients postoperative course was also complicated by acute renal insufficiency.  Her creatinine was found to be elevated at 2.5 on postoperative day #2.  This level was repeated and patients creatinine had dropped to 1.2 by  postoperative day #3.  This remained stable throughout the remainder of her postoperative course.  The remainder of the postoperative course did remain uneventful. Dictated by:   Adair Patter, P.A. Attending Physician:  Cleatrice Burke DD:  01/17/02 TD:  01/17/02 Job: [id number]

## 2011-05-02 NOTE — Telephone Encounter (Signed)
Pt had test done on Weds. And wanted results if they were in . Please call pt.

## 2011-05-02 NOTE — Op Note (Signed)
[place]  Patient:    Cassandra Fisher, Cassandra Fisher Visit Number: [id number] MRN: [id number]          Service Type: MED Location: 2300 2306 01 Attending Physician:  Cleatrice Burke Dictated by:   Alleen Borne, M.D. Proc. Date: 12/20/01 Admit Date:  12/14/2001   CC:         Madolyn Frieze. Crenshaw, M.D. [facility]   Operative Report  PREOPERATIVE DIAGNOSIS:  Severe three-vessel coronary artery disease with unstable angina.  Status post non-Q wave myocardial infarction.  POSTOPERATIVE DIAGNOSIS:  Severe three-vessel coronary artery disease with unstable angina.  Status post non-Q wave myocardial infarction.  OPERATION PERFORMED:  Median sternotomy, extracorporeal circulation, coronary artery bypass graft surgery x 6 using left internal mammary artery graft to the left anterior descending coronary artery, with a saphenous vein graft to the diagonal branch of the left anterior descending, a sequential saphenous vein graft to the first, second and third obtuse marginal branches of the left circumflex coronary artery, and a saphenous vein graft to the posterior descending branch of the right coronary artery.  SURGEON:  Alleen Borne, M.D.  ASSISTANT:  Adair Patter, P.A.  ANESTHESIA:  General endotracheal.  INDICATIONS FOR PROCEDURE:  The patient is a 60 year old white female with multiple cardiac risk factors including poorly controlled diabetes who has a history of coronary disease dating back to September of 2000 at which time she had pecutaneous intervention of the left circumflex.  In December of 2000 she had congestive heart failure and left upper extremity pain and cardiac catheterization showed a 95% in-stent restenosis in the left circumflex coronary artery.  She had some disease in the LAD and diagonal as well as the right coronary artery at that time.  She underwent high speed rotational atherectomy of the left circumflex and has done reasonably well  until recently when she developed swelling in her lower extremities as well as some fatigue and shortness of breath.  She ruled in for a non-Q wave myocardial infarction. She underwent cardiac catheterization which showed severe three vessel disease.  The left main coronary artery was short.  The LAD had a long proximal 70 to 80% stenosis and a mid-80% stenosis.  There was diffuse distal disease of 30 to 40%.  There was a large first diagonal that was seen from previous catheterization that was occluded at the ostium and barely visualized.  The left circumflex had an ostial stent with a long 25% stenosis and a mid-40% stenosis.  The first large obtuse marginal had 80% ostial stenosis.  The second large obtuse marginal had 70% stenosis.  There was a third marginal branch.  The right coronary artery was a dominant vessel with diffuse 25 to 30% midvessel stenosis.  There was 80% distal stenosis before the posterolateral branch which was small.  The proximal portion of the posterior descending coronary artery had 80% proximal stenosis.  The left ventricular ejection fraction was 50% with anterior hypokinesis.   After review of the angiograms and examination of the patient it was felt that coronary artery bypass graft surgery was the best treatment.  I discussed the operative procedure with the patient and her husband including alternatives to surgery, benefits, and risks including bleeding, possible blood transfusion, infection, stroke, myocardial infarction, and death.  They understood and agreed to proceed with surgery.  DESCRIPTION OF PROCEDURE:  The patient was taken to the operating room and placed on the table in supine position.  After induction of general  endotracheal anesthesia, a Foley catheter was placed in the bladder using sterile technique.  Then the chest, abdomen and both lower extremities were prepped and draped in the usual sterile manner.  The chest was entered through a  median sternotomy incision and the pericardium opened in the midline. Examination of the heart showed good ventricular contractility.  The ascending aorta had no palpable plaques in it.  Then the left internal mammary artery was harvested from the chest wall as a pedicle graft.  This was a small to medium caliber vessel with excellent blood flow through it.  At the same time a segment of greater saphenous vein was harvested from the right lower lobe.  This vein was of medium size and good quality.  After several inches, the vein bifurcated into two small branches. Therefore another section of vein was harvested from the left lower leg and this vein was also of medium size and good quality.  Unfortunately, it also bifurcated in two small branches after several inches.  Both of these segments of vein were usable for some of the bypasses.  It was necessary to harvest a third piece of saphenous vein from the left thigh.  This vein was of large caliber but good quality.  Then the patient was heparinized and when an adequate activated clotting time was achieved, the distal ascending aorta was cannulated using a 20 French aortic cannula for arterial inflow.  Venous outflow was achieved using a two-stage venous cannula through the right atrial appendage.  An antegrade cardioplegia and vent cannula was inserted into the aortic root.  The patient was placed on cardiopulmonary bypass and the distal coronary arteries were identified.  The LAD was diffusely diseased but graftable distally.  The diagonal branch was diffusely diseased down to the tip of the vessel.  The first second and third marginal branches were all medium size graftable vessels that had some distal disease in them.  The right coronary artery was diffusely diseased with calcific plaque.  The posterior descending branch was visible proximally but then was lying beneath large posterior descending vein.  The posterolateral branch was a  small nongraftable vessel. Then the aorta was crossclamped and 500 cc of cold blood antegrade  cardioplegia was administered in the aortic root with quick arrest of the heart.  Systemic hypothermia to 20 degrees centigrade and topical hypothermia with iced saline was used.  A temperature probe was placed in the septum and an insulating pad in the pericardium.  The first distal anastomosis was performed to the posterior descending coronary artery.  The internal diameter was 1.6 mm.  The conduit used was a segment of greater saphenous vein.  Anastomosis was performed in end-to-side manner using continuous 7-0 Prolene suture.  The flow was measured through the graft and was excellent.  The second distal anastomosis was performed to the diagonal branch.  The internal diameter was about 1.5 mm.  The conduit used was a second segment of greater saphenous vein.  The anastomosis was performed in an end-to-side manner using continuous 7-0 Prolene suture.  The flow was measured through the graft and was excellent.  Then another dose of cardioplegia was given down the vein graft and into the aortic root.  The third distal anastomosis was performed to the first obtuse marginal branch.  The internal diameter in this area was about 1.6 mm.  The conduit used was a third segment of greater saphenous vein.  The anastomosis was performed in a sequential side-to-side manner using continuous  7-0 Prolene suture.  The flow was measured through the graft and was excellent.  The fourth distal anastomosis was performed to the second marginal branch. The internal diameter was 1.6 mm.  The conduit used was the same segment of greater saphenous vein.  The anastomosis was performed in a sequential side-to-side manner using continuous 7-0 Prolene suture.  The flow was measured through the graft and was excellent.  The fifth distal anastomosis was then performed to the third marginal branch. The internal diameter  was 1.6 mm.  The conduit used was the same segment of greater saphenous vein.  The anastomosis was performed in a sequential end-to-side manner using continuous 7-0 Prolene suture.  The flow was measured through the graft and was excellent.  Then another dose of cardioplegia was given down the vein grafts and in the aortic root.  The sixth distal anastomosis was then performed to the distal portion of the left anterior descending coronary artery.  The internal diameter was about 1.6 mm.  The conduit used was the left internal mammary artery graft and this was brought through an opening in the left pericardium anterior to the phrenic nerve.  It was anastomosed to the left anterior descending in end-to-side manner using continuous 8-0 Prolene suture.  The pedicle was tacked to epicardium with 6-0 Prolene sutures.  The  patient was rewarmed to 37 degrees and the clamp removed from the mammary pedicle.  There was rapid warming of the ventricular septum and return of spontaneous ventricular fibrillation. The crossclamp was removed with a time of 82 minutes and the patient defibrillated into sinus rhythm.  A partial occlusion clamp was placed on the aortic root and the three proximal vein graft anastomoses were performed in end-to-side manner using continuous 6-0 Prolene suture.  The clamps were removed, the vein grafts deaired and the clamps removed from them.  The proximal and distal anastomoses appeared hemostatic and the line of the grafts satisfactory.  Graft markers were placed around the proximal anastomoses.  Two temporary right ventricular and right atrial pacing wires were placed and brought out through the skin.  When the patient had rewarmed to 37 degrees centigrade, she was weaned from cardiopulmonary bypass on low dose dopamine.  Total bypass time was 128 minutes.  Cardiac function appeared excellent with cardiac output of 5L a minute.  Protamine was given and the venous and  aortic cannulas were removed without difficulty.  Hemostasis was achieved.  Three chest tubes were placed with a tube in the posterior pericardium and one in the left pleural space and one in the anterior mediastinum.  The pericardium was reapproximated over the heart.  The sternum was closed with #6 stainless steel wires.  The fascia was closed with continuous #1 Vicryl suture.  The subcutaneous tissues were closed using continuous 2-0 Vicryl and the skin with 3-0 Vicryl subcuticular closure.  The lower extremity vein harvest site was closed in layers in a similar manner with staples used for the skin.  The sponge, needle and instrument counts were correct according to the scrub nurse.  A dry sterile dressing was applied over the incisions and around the chest tubes which were hooked to Pleur-evac suction.  The patient remained hemodynamically stable and was transported to the SICU in guarded but stable condition. Dictated by:   Alleen Borne, M.D. Attending Physician:  Cleatrice Burke DD:  12/20/01 TD:  12/20/01 Job: [id number]

## 2011-05-02 NOTE — Op Note (Signed)
   NAME:  Cassandra Fisher, Cassandra Fisher                          ACCOUNT NO.:  [id number]   MEDICAL RECORD NO.:  [id number]                   PATIENT TYPE:  AMB   LOCATION:  SDC                                  FACILITY:  [facility]   PHYSICIAN:  Michelle L. Vincente Poli, M.D.            DATE OF BIRTH:  01/21/1951   DATE OF PROCEDURE:  07/05/2003  DATE OF DISCHARGE:                                 OPERATIVE REPORT   PREOPERATIVE DIAGNOSIS:  Dysfunctional uterine bleeding.   POSTOPERATIVE DIAGNOSIS:  Dysfunctional uterine bleeding.   PROCEDURE:  Dilation and curettage, diagnostic hysteroscopy.   SURGEON:  Michelle L. Vincente Poli, M.D.   ANESTHESIA:  LMA with paracervical block.   FINDINGS:  Shaggy, thickened endometrium.   PROCEDURE:  The patient was taken to the operating room, she was given  anesthesia and then placed in the lithotomy position.  The vagina and vulva  were prepped and draped in the usual sterile fashion.  An in and out  catheter was used to empty the bladder.  The speculum was inserted into the  vagina and the cervix was grasped with the tenaculum.  A paracervical block  was performed at 5 and 7 o'clock.  Using [facility] dilators, the cervical  internal os was gently dilated.  A diagnostic hysteroscope was inserted into  the uterus.  A moderate amount of blood was noted and a very thickened  endometrium.  The hysteroscope was removed and a thorough sharp uterine  curettage was performed of all four walls of the uterus.  This was performed  three times with a moderate amount of tissue.  At the end of the procedure,  all of the tissue was sent to pathology for analysis.  There was no vaginal  bleeding noted.  The patient went to the recovery room in stable condition.  All sponge, lap, and instrument counts were correct x 2.                                               Michelle L. Vincente Poli, M.D.    Florestine Avers  D:  07/05/2003  T:  07/05/2003  Job:  [id number]

## 2011-05-02 NOTE — Consult Note (Signed)
[place]  Patient:    Cassandra Fisher, Cassandra Fisher Visit Number: [id number] MRN: [id number]          Service Type: MED Location: [id number] Attending Physician:  Tresa Garter Dictated by:   Alleen Borne, M.D. Proc. Date: 12/17/01 Admit Date:  12/14/2001   CC:         Madolyn Frieze. Jens Som, M.D. [facility]   Consultation Report  REFERRING PHYSICIAN:  Rollene Rotunda, M.D. and Madolyn Frieze. Jens Som, M.D.  REASON FOR CONSULTATION:  Severe three-vessel coronary artery disease status post non-Q-wave myocardial infarction.  HISTORY OF PRESENT ILLNESS:  This patient is a 60 year old white female with multiple cardiac risk factors including poorly-controlled diabetes who has a history of coronary artery disease dating back to September 2000, at which time she had a percutaneous intervention to the left circumflex.  In December 2000 she presented with congestive heart failure and left upper extremity pain and cardiac catheterization found a 95% in-stent restenosis in the left circumflex coronary artery.  There was also some disease in the LAD/diagonal and a right coronary artery.  She underwent a high-speed rotational atherectomy of the circumflex at that time and has done reasonably well, although she says she has never gotten back to her pre-September 2000 state. She remains fairly active at home, caring for a disabled child.  She has denied any chest pain.  On Christmas night she noticed some increased swelling in her lower extremities that resolved the following morning, but then returned over the days prior to her admission on December 14, 2001.  She also developed some fatigue and woke up in the morning with shortness of breath that was new for her.  She denied any associated chest pain.  She did have some pain under her left shoulder that she thought was different from her pain prior to her percutaneous interventions.  She did rule in for a  non-Q-wave myocardial infarction with a troponin level of 1.24, with a CK level of 120 and an MB of 7.9.  She underwent cardiac catheterization yesterday which showed severe three-vessel disease.  The left main coronary artery was short. The LAD had a long proximal 70-80% stenosis and a mid 80% stenosis.  There was diffuse distal disease of 30-40%.  There was a large first diagonal that was seen from a previous catheterization that was occluded at the ostium.  The left circumflex had an ostial stent with a long 25% and mid 40% stenosis into a large obtuse marginal branch.  The first large obtuse marginal had an 80% ostial stenosis and the second large obtuse marginal had 70% stenosis.  The right coronary artery was the dominant vessel with diffuse 25-30% mid vessel stenosis.  There was an 80% distal stenosis before the posterolateral branch. The proximal portion of the posterior descending coronary artery had 80% proximal stenosis.  Left ventricular ejection fraction was about 50% with anterior hypokinesis.  MEDICATIONS PRIOR TO ADMISSION: 1. Aspirin one q.d. 2. Lipitor 10 mg q.d. 3. Amaryl 2 mg q.d. 4. Lopressor 50 mg b.i.d. 5. Altace 5 mg b.i.d. 6. Norvasc 5 mg q.d.  PAST MEDICAL HISTORY:  Significant for type 2 diabetes mellitus that is poorly controlled.  She has a history of hypertension and hyperlipidemia.  She has had no prior surgeries.  REVIEW OF SYSTEMS:  CONSTITUTIONAL:  She denies any fever or chills.  She has had some weight gain.  EYES:  Negative.  ENT:  Negative.  ENDOCRINE:  She  has type 2 diabetes mellitus as mentioned above.  She has no history of thyroid disease.  CARDIOVASCULAR:  As above.  She has had no chest pain.  She denies palpitations.  She has had no PND or orthopnea.  RESPIRATORY:  She denies cough or sputum production.  GU:  She denies dysuria and hematuria.  GI:  She has had no nausea or vomiting.  She denies melena or bright red blood per rectum.   She has had some constipation.  MUSCULOSKELETAL:  Negative. PSYCHIATRIC:  She does have some anxiety.  SKIN:  Negative.  ALLERGIES: Negative.  SOCIAL HISTORY:  Significant for being married for 35 years.  She has four children.  She spends most of her time at home caring for a disabled child. She quit smoking three years ago but has a 60 pack-year smoking history.  She denies ethanol use.  FAMILY HISTORY:  Negative for coronary disease.  PHYSICAL EXAMINATION:  VITAL SIGNS:  Blood pressure 145/70, pulse 75 and regular, respiratory rate 16 and not labored.  GENERAL:  She is an obese white female in no distress.  HEENT:  Shows her to be normocephalic and atraumatic.  Pupils equal and reactive to light and accommodation.  Extraocular muscles are intact.  Her throat is clear.  NECK:  Shows normal carotid pulses bilaterally.  There are no bruits.  There is no adenopathy or thyromegaly.  CARDIAC:  Shows a regular rate and rhythm with a normal S1 and S2.  There is no murmur, rub, or gallop.  LUNGS:  Clear.  ABDOMEN:  Shows active bowel sounds.  Her abdomen is soft and obese.  There are no palpable masses and no organomegaly.  EXTREMITIES:  Shows no peripheral edema.  Pedal pulses are palpable bilaterally.  SKIN:  Warm and dry.  NEUROLOGIC:  Shows her to be alert and oriented x 3.  Motor and sensory exams are grossly normal.  LABORATORY EXAMINATION:  Sodium 133, glucose 312.  BUN 22 and creatinine 0.9. Hemoglobin A1c 10.0.  Coagulation profile is within normal limits.  Hemoglobin on admission was 12.4.  Chest x-ray showed mild congestive heart failure.  Echocardiogram dated December 15, 2001 shows overall normal left ventricular systolic function with an ejection fraction between 55% and 65%.  There are no regional wall motion abnormalities.  There is mild calcification of the aortic valve without stenosis.  There was mild mitral annular calcification  without regurgitation.   Electrocardiogram showed normal sinus rhythm with nonspecific T wave abnormality.  IMPRESSION:  This patient has severe three-vessel coronary artery disease and was admitted with a non-Q-wave myocardial infarction.  She has undergone multiple percutaneous interventions on the left circumflex coronary artery. With diabetes, I agree that proceeding with a coronary artery bypass graft surgery would be the best treatment for her.  I discussed the operative procedure with her and her husband, including alternatives, benefits, and risks, including bleeding, possible blood transfusion, infection, stroke, myocardial infarction, and death.  They understand and would like to proceed with surgery.  We will plan to do this on Monday morning.Dictated by:   Alleen Borne, M.D. Attending Physician:  Tresa Garter DD:  12/17/01 TD:  12/18/01 Job: [id number]

## 2011-05-02 NOTE — Consult Note (Signed)
[place]. [facility]  Patient:    Cassandra Fisher, Cassandra Fisher Visit Number: [id number] MRN: [id number]          Service Type: MED Location: [id number] Attending Physician:  Tresa Garter Dictated by:   Madolyn Frieze. Cassandra Fisher, M.D. [facility] Proc. Date: 12/14/01 Admit Date:  12/14/2001                            Consultation Report  HISTORY OF PRESENT ILLNESS:  Cassandra Fisher is a 60 year old female with a past medical history of coronary artery disease, diabetes mellitus, hypertension, and hyperlipidemia who presents with new onset congestive heart failure.  The patients cardiac history dates back to September of 2000 when she had PCI of the circumflex.  In December of 2000, she presented with mild congestive heart failure and left upper extremity pain.  At that time, she underwent a cardiac catheterization and was found to have a 95% end stent restenosis of her circumflex.  She had an ostial 50% LAD followed by a 60% mid LAD.  There was a 95% diagonal.  There was a 50% circumflex after the marginal and 40% right coronary artery.  Her ejection fraction was 55%.  She had high speed rotational atherectomy of the circumflex at that time.  Since then she has done reasonably well.  There is no dyspnea on exertion, orthopnea, paroxysmal nocturnal dyspnea, pedal edema, palpitations, presyncope, syncope, or left upper extremity pain.  Christmas night the patient noticed increased swelling in her lower extremities.  This did resolve the following morning but has returned over the past several days.  She felt somewhat fatigued last p.m. This a.m. she woke up with shortness of breath which was new in onset.  There was no associated chest pain.  She does have some pain under her left shoulder but this pain is different from the pain prior to her PCI.  It does increase with certain movements and is relieved when she puts her arm in one particular position.  She does not have exertional  left upper extremity pain.  There has been on palpitations or syncope.  Because of her symptoms, we were asked to further evaluate.  CURRENT MEDICATIONS: 1. Aspirin 325 mg p.o. q.d. 2. Lipitor 10 mg p.o. q.d. 3. Amaryl 2 mg p.o. q.d. 4. Metoprolol 50 mg p.o. b.i.d. 5. Altace 5 mg p.o. b.i.d. 6. Norvasc 5 mg p.o. q.d.  ALLERGIES:  She has no known drug allergies.  PAST MEDICAL HISTORY: 1. Coronary artery disease. 2. Diabetes mellitus. 3. Hypertension. 4. Hyperlipidemia.  For details of her past medical history, social history, family history and review of systems, please refer to the handwritten note of Cassandra Fisher, P.A.  PHYSICAL EXAMINATION:  VITAL SIGNS:  Blood pressure 205/88 on admission with a pulse of 111.  She was afebrile.  She is 95% on room air.  GENERAL:  She is well-developed, well-nourished in no acute distress.  SKIN:  Warm and dry.  HEENT:  Unremarkable with normal eyelids.  NECK:  Supple with a normal upstroke bilaterally and there are no bruits noted.  There is no jugular venous distention and no thyromegaly noted.  CHEST:  Mild decreased breath sounds and crackles at the bases.  CARDIOVASCULAR:  Regular rate and rhythm with a normal S1 and S2.  There is an S4.  I can appreciate no murmurs.  ABDOMEN:  Not tender or distended, positive bowel sounds with no hepatosplenomegaly, and  no masses appreciated.  There is no abdominal bruit. She has 2+ femoral pulses bilaterally and no bruits.  EXTREMITIES:  1-2+ edema to the mid tibia bilaterally.  She has 2+ dorsalis pedis pulses bilaterally.  I can palpate no cords.  NEUROLOGIC:  Exam is grossly intact.  Her chest x-ray shows mild edema.  Her electrocardiogram shows a sinus tachycardia at a rate of 108.  The axis is normal.  There are nonspecific ST changes.  LABORATORY DATA:  Hemoglobin and hematocrit are 12.4 and 35.7.  White blood cell count is 6.8.  BUN and creatinine are 12 and 0.8.  Her initial  enzymes are negative.  Her BNP is 213.  DIAGNOSES: 1. New onset congestive heart failure. 2. History of coronary artery disease. 3. Diabetes mellitus. 4. Hypertension. 5. Hyperlipidemia.  PLAN:  [name] presents with new onset of congestive heart failure with mild pulmonary edema and peripheral edema.  The etiology of this is not clear to me.  She does have a history of coronary artery disease and certainly could have ischemia as a cause (she has had no chest pain but she does have diabetes mellitus).  We will admit and rule out myocardial infarction with serial enzymes.  We will continue with aspirin as well as metoprolol and Altace.  We will add Lovenox.  If her enzymes are negative, we will plan to proceed with a stress cardiolite.  It it shows significant ischemia, then we will proceed with catheterization.  I did discuss proceeding immediately to cardiac catheterization but the patient was hesitant and would prefer a stress test first.  Elevation of her blood pressure could also explain some of her edema. We will adjust her medications for optimal control.  I will check an echocardiogram to evaluate her left ventricular function and if there is significant wall motion abnormalities, then immediate cardiac catheterization would also be indicated instead of proceeding with the Cardiolite first.  We will also diurese her and follow her renal function. Dictated by:   Madolyn Frieze. Cassandra Fisher, M.D. [facility] Attending Physician:  Tresa Garter DD:  12/14/01 TD:  12/14/01 Job: [id number]

## 2011-05-02 NOTE — Discharge Summary (Signed)
[place]. [facility]  Patient:    Cassandra Fisher, Cassandra Fisher Visit Number: [id number] MRN: [id number]          Service Type: Attending:  Alleen Borne, M.D. Dictated by:   Adair Patter, P.A. Adm. Date:  12/14/01 Disc. Date: 12/25/01   CC:         CVTS office             Madolyn Frieze. Jens Som, M.D. [facility]                           Discharge Summary  DATE OF BIRTH:  [birth year]  ADMISSION DIAGNOSIS:  Chest pain.  SECONDARY DIAGNOSES: 1. History of coronary artery disease. 2. Diabetes mellitus. 3. Hypertension. 4. Hyperlipidemia. 5. Postoperative anemia secondary to blood loss.  HISTORY OF PRESENT ILLNESS AND HOSPITAL COURSE:  Cassandra Fisher is being admitted to [facility] on December 14, 2001, secondary to chest pain, shortness of breath, and bilateral lower extremity swelling.  Because of the patients known history of coronary artery disease the patient underwent a cardiovascular including cardiac catheterization.  This revealed the patient had significant coronary artery disease amenable to surgical correction. Because of this, Dr. Evelene Croon was consulted.  On December 20, 2001, the patient underwent coronary artery bypass graft x 6 with left internal mammary artery anastomosed to the left anterior descending artery, sequential saphenous vein graft to the obtuse marginal 1, 2, and 3 arteries, saphenous vein graft to the posterior descending artery, and saphenous vein graft to the first diagonal artery.  No complications were noted during the procedure. Postoperatively the patient had a relatively uneventful hospital course. There was some difficulty in achieving adequate blood glucose management. However, adequate control was achieved with resuming the patients Amaryl which she took as an outpatient and starting the patient on Lantus insulin. The remainder of her postoperative course remained uneventful, and the patient was subsequently discharged home in  stable condition on December 25, 2001.  DISCHARGE MEDICATIONS: 1. Tylox 1-2 tablets every four to six hours as needed for pain. 2. Aspirin 325 mg, 1 daily. 3. Lopressor 25 mg, 1/2 of a 50-mg tablet twice daily. 4. Lipitor 10 mg, 1 daily. 5. Amaryl 2 mg, 1 daily. 6. Lasix 40 mg, 1 daily. 7. Potassium chloride 20 mEq, 1 daily. 8. Lantus insulin 50 units every evening.  ACTIVITY:  The patient was told to avoid driving, strenuous activity, and lifting heavy objects.  She was told to walk daily and to continue to use her incentive spirometer every hour.  DIET:  1800 calorie ADA diet.  WOUND CARE:  The patient was told she could shower and clean her incisions with soap and water.  DISPOSITION:  Home.  FOLLOW-UP:  The patient was told to call her cardiologist, Dr. Jens Som, for an appointment in two weeks.  She was told to bring chest x-ray to Dr. Garen Grams appointment with her.  Her appointment with Dr. Laneta Simmers will be on Tuesday, January 18, 2002, at 9 a.m.  Dictated by:   Adair Patter, P.A.  Attending:  Alleen Borne, M.D. DD:  12/24/01 TD:  12/26/01 Job: [id number]

## 2011-05-02 NOTE — Telephone Encounter (Signed)
LMOM for call back. 

## 2011-05-05 NOTE — Telephone Encounter (Signed)
Test results

## 2011-06-16 ENCOUNTER — Encounter: Payer: Self-pay | Admitting: Cardiology

## 2011-06-23 ENCOUNTER — Encounter: Payer: Self-pay | Admitting: Family Medicine

## 2011-06-23 DIAGNOSIS — Z951 Presence of aortocoronary bypass graft: Secondary | ICD-10-CM | POA: Insufficient documentation

## 2011-07-16 ENCOUNTER — Encounter: Payer: Self-pay | Admitting: Cardiology

## 2011-07-30 ENCOUNTER — Ambulatory Visit: Payer: Medicaid Other | Admitting: Cardiology

## 2011-07-30 ENCOUNTER — Telehealth: Payer: Self-pay | Admitting: Cardiology

## 2011-07-30 NOTE — Telephone Encounter (Signed)
Per pt call pt would like Dr. Jens Som to write pt RX for insulin at $6.50/bottle. Currently pt insulin is $25. Please return pt call to advise/discuss.  Currently pt insulin is Hum[place]in-N.

## 2011-07-30 NOTE — Telephone Encounter (Signed)
Spoke with pt, she will need to discuss insulin with her primary care md  Deliah Goody .

## 2011-09-01 ENCOUNTER — Encounter: Payer: Self-pay | Admitting: Cardiology

## 2011-09-01 ENCOUNTER — Telehealth: Payer: Self-pay | Admitting: Cardiology

## 2011-09-01 ENCOUNTER — Ambulatory Visit (INDEPENDENT_AMBULATORY_CARE_PROVIDER_SITE_OTHER): Payer: Medicaid Other | Admitting: Cardiology

## 2011-09-01 VITALS — BP 110/70 | HR 64 | Ht 67.0 in | Wt 255.0 lb

## 2011-09-01 DIAGNOSIS — I2510 Atherosclerotic heart disease of native coronary artery without angina pectoris: Secondary | ICD-10-CM

## 2011-09-01 DIAGNOSIS — E785 Hyperlipidemia, unspecified: Secondary | ICD-10-CM

## 2011-09-01 DIAGNOSIS — I679 Cerebrovascular disease, unspecified: Secondary | ICD-10-CM

## 2011-09-01 LAB — BASIC METABOLIC PANEL
GFR: 41.74 mL/min — ABNORMAL LOW (ref >60.00)
Potassium: 5 mEq/L (ref 3.5–5.1)
Sodium: 136 mEq/L (ref 135–145)

## 2011-09-01 MED ORDER — FUROSEMIDE 40 MG PO TABS: 40.0000 mg | ORAL_TABLET | Freq: Every day | ORAL | Status: DC

## 2011-09-01 MED ORDER — METOPROLOL TARTRATE 50 MG PO TABS: 75.0000 mg | ORAL_TABLET | Freq: Two times a day (BID) | ORAL | Status: DC

## 2011-09-01 MED ORDER — ENALAPRIL MALEATE 20 MG PO TABS: 20.0000 mg | ORAL_TABLET | Freq: Every day | ORAL | Status: DC

## 2011-09-01 MED ORDER — LOVASTATIN 20 MG PO TABS: 20.0000 mg | ORAL_TABLET | Freq: Every day | ORAL | Status: DC

## 2011-09-01 MED ORDER — GLIPIZIDE 5 MG PO TABS: 10.0000 mg | ORAL_TABLET | Freq: Two times a day (BID) | ORAL | Status: DC

## 2011-09-01 NOTE — Patient Instructions (Signed)
Your physician wants you to follow-up in: ONE YEAR You will receive a reminder letter in the mail two months in advance. If you don't receive a letter, please call our office to schedule the follow-up appointment.  

## 2011-09-01 NOTE — Assessment & Plan Note (Signed)
Continue statin. 

## 2011-09-01 NOTE — Assessment & Plan Note (Signed)
Continue aspirin and statin. Followup carotid Dopplers May 2013. 

## 2011-09-01 NOTE — Assessment & Plan Note (Signed)
No recurrent episodes. Continue aspirin and beta blocker. She has refused Coumadin. She understands increased risk of CVA.

## 2011-09-01 NOTE — Assessment & Plan Note (Signed)
Euvolemic on examination. Continue present dose of diuretic. 

## 2011-09-01 NOTE — Assessment & Plan Note (Signed)
Continue aspirin, ACE inhibitor, beta blocker statin.

## 2011-09-01 NOTE — Telephone Encounter (Signed)
They need clarification on medication sent in today

## 2011-09-01 NOTE — Progress Notes (Signed)
HPI: Pleasant female with past medical history of coronary artery disease status post coronary bypass graft and atrial fibrillation for followup. Patient was admitted in Nov 2011 with atrial fibrillation with a rapid ventricular response. Troponin mildly increased. TSH normal. Echocardiogram in November of 2011 showed an ejection fraction of 45-50% with mild mitral regurgitation. Cardiac catheterization in Nov 2011 revealed an ejection fraction of 50%, severe three-vessel coronary artery disease status post 6-vessel coronary artery bypass graft with 6 patent bypass grafts. Severe disease in the proximal portion of the previously stented circumflex vessel that is unchanged from films in 2008 but is unprotected by a vein graft. Medical therapy was recommended. She had carotid Dopplers performed in Nov 2011, which showed 40-59% bilateral stenosis and f/u recommended in one year. The patient declined Coumadin because of financial issues. I last saw her in March of 2012. Since then, she has some dyspnea on exertion but no orthopnea or PND. Occasional mild pedal edema. No chest pain, palpitations or syncope.  Current Outpatient Prescriptions  Medication Sig Dispense Refill  . aspirin EC 325 MG tablet Take 325 mg by mouth daily.        . enalapril (VASOTEC) 20 MG tablet Take 20 mg by mouth daily.        . furosemide (LASIX) 40 MG tablet Take 40 mg by mouth daily. 1/2 to 1 tablet by mouth once daily      . glipiZIDE (GLUCOTROL) 5 MG tablet Take 10 mg by mouth 2 (two) times daily before a meal.        . insulin NPH-insulin regular (NOVOLIN 70/30) (70-30) 100 UNIT/ML injection Inject 80 Units into the skin 2 (two) times daily with a meal.       . lovastatin (MEVACOR) 20 MG tablet Take 20 mg by mouth at bedtime.        . metoprolol (LOPRESSOR) 50 MG tablet Take 75 mg by mouth 2 (two) times daily.       . Multiple Vitamin (MULTIVITAMIN) capsule Take 1 capsule by mouth daily.           Past Medical History    Diagnosis Date  . CAD (coronary artery disease)   . Atrial fibrillation   . CHF (congestive heart failure)   . Hyperlipidemia   . HTN (hypertension)   . MI (myocardial infarction)   . DM (diabetes mellitus)   . COPD (chronic obstructive pulmonary disease)   . Vertigo   . Anxiety   . Obesity   . Hx of CABG     Past Surgical History  Procedure Date  . Coronary artery bypass graft 12/2001  . S/p dilatation and curettage     History   Social History  . Marital Status: Married    Spouse Name: N/A    Number of Children: N/A  . Years of Education: N/A   Occupational History  . Not on file.   Social History Main Topics  . Smoking status: Former Smoker    Types: Cigarettes    Quit date: 09/01/1991  . Smokeless tobacco: Not on file   Comment: has a 30 pack year history, quit in late 1990s  . Alcohol Use: No  . Drug Use: No  . Sexually Active: Not on file   Other Topics Concern  . Not on file   Social History Narrative  . No narrative on file    ROS: no fevers or chills, productive cough, hemoptysis, dysphasia, odynophagia, melena, hematochezia, dysuria, hematuria, rash, seizure activity, orthopnea,  PND, pedal edema, claudication. Remaining systems are negative.  Physical Exam: Well-developed obese in no acute distress.  Skin is warm and dry.  HEENT is normal.  Neck is supple. No thyromegaly.  Chest is clear to auscultation with normal expansion.  Cardiovascular exam is regular rate and rhythm.  Abdominal exam nontender or distended. No masses palpated. Extremities show trace edema. neuro grossly intact  ECG NSR, LBBB

## 2011-09-01 NOTE — Telephone Encounter (Signed)
Called pharmacy and took care of this issue they needed a different quanty

## 2011-09-01 NOTE — Assessment & Plan Note (Signed)
Blood pressure controlled on present medications. Will continue. Check potassium and renal function.

## 2011-09-25 LAB — DIFFERENTIAL
Basophils Absolute: 0
Lymphocytes Relative: 40
Lymphs Abs: 3.5 — ABNORMAL HIGH
Monocytes Absolute: 0.4
Neutro Abs: 4.6

## 2011-09-25 LAB — PROTIME-INR: Prothrombin Time: 12.1

## 2011-09-25 LAB — CARDIAC PANEL(CRET KIN+CKTOT+MB+TROPI)
CK, MB: 1.6
CK, MB: 2.2
Total CK: 57
Troponin I: 0.03

## 2011-09-25 LAB — CK TOTAL AND CKMB (NOT AT ARMC)
CK, MB: 3
Total CK: 87

## 2011-09-25 LAB — HEPARIN LEVEL (UNFRACTIONATED): Heparin Unfractionated: 0.63

## 2011-09-25 LAB — BASIC METABOLIC PANEL
BUN: 23
CO2: 26
Calcium: 9.2
Chloride: 100
GFR calc Af Amer: >60
GFR calc non Af Amer: 59 — ABNORMAL LOW
Glucose, Bld: 292 — ABNORMAL HIGH
Potassium: 3.8
Potassium: 4
Sodium: 138

## 2011-09-25 LAB — I-STAT 8, (EC8 V) (CONVERTED LAB)
BUN: 28 — ABNORMAL HIGH
Chloride: 102
Glucose, Bld: 231 — ABNORMAL HIGH
pCO2, Ven: 48.8
pH, Ven: 7.377 — ABNORMAL HIGH

## 2011-09-25 LAB — POCT CARDIAC MARKERS
CKMB, poc: 4.4
Myoglobin, poc: 84.7

## 2011-09-25 LAB — CBC
Hemoglobin: 13.2
Platelets: 231
RDW: 15.5 — ABNORMAL HIGH
WBC: 8.8

## 2011-09-25 LAB — B-NATRIURETIC PEPTIDE (CONVERTED LAB): Pro B Natriuretic peptide (BNP): 283 — ABNORMAL HIGH

## 2011-09-25 LAB — HEMOGLOBIN A1C: Mean Plasma Glucose: 268

## 2012-05-27 ENCOUNTER — Other Ambulatory Visit: Payer: Self-pay | Admitting: *Deleted

## 2012-05-27 DIAGNOSIS — I6529 Occlusion and stenosis of unspecified carotid artery: Secondary | ICD-10-CM

## 2012-05-28 ENCOUNTER — Encounter (INDEPENDENT_AMBULATORY_CARE_PROVIDER_SITE_OTHER): Payer: Self-pay

## 2012-05-28 DIAGNOSIS — I6529 Occlusion and stenosis of unspecified carotid artery: Secondary | ICD-10-CM

## 2012-09-01 ENCOUNTER — Other Ambulatory Visit: Payer: Self-pay | Admitting: Cardiology

## 2012-09-02 ENCOUNTER — Telehealth: Payer: Self-pay | Admitting: Cardiology

## 2012-09-02 DIAGNOSIS — E785 Hyperlipidemia, unspecified: Secondary | ICD-10-CM

## 2012-09-02 DIAGNOSIS — I2510 Atherosclerotic heart disease of native coronary artery without angina pectoris: Secondary | ICD-10-CM

## 2012-09-02 DIAGNOSIS — I679 Cerebrovascular disease, unspecified: Secondary | ICD-10-CM

## 2012-09-02 MED ORDER — GLIPIZIDE 5 MG PO TABS: 10.0000 mg | ORAL_TABLET | Freq: Two times a day (BID) | ORAL | Status: DC

## 2012-09-02 NOTE — Telephone Encounter (Signed)
New Problem:    Patient called in needing a refill of her glipiZIDE (GLUCOTROL) 5 MG tablet.  Patient claims to get that refilled here because Dr. Jens Som is the only physician she has at the moment.

## 2012-11-02 ENCOUNTER — Telehealth: Payer: Self-pay | Admitting: Cardiology

## 2012-11-02 NOTE — Telephone Encounter (Signed)
Pt has been in bed for the last 3wks with muscle spasms and she wants to know if she can take alleve or something and this is the only provider she has because she can't afford another provider

## 2012-11-02 NOTE — Telephone Encounter (Signed)
Spoke with pt, aware the alleve will effect her already compromised kidney function. The pt would like to try it to see if it helps. Pt voiced understanding to use sparingly.

## 2012-12-03 ENCOUNTER — Telehealth: Payer: Self-pay | Admitting: Cardiology

## 2012-12-03 ENCOUNTER — Encounter: Payer: Self-pay | Admitting: Cardiology

## 2012-12-03 NOTE — Telephone Encounter (Signed)
New Problem:    I called the home number listed for the patient and received a message that it was unable to receive calls at this time.  I called the number listed for the patient emergency contact and received a message that their voice mailbox was full.  I was unable to leave a message and so I sent a letter.

## 2013-01-20 ENCOUNTER — Ambulatory Visit: Payer: Self-pay | Admitting: Cardiology

## 2013-02-10 ENCOUNTER — Ambulatory Visit (INDEPENDENT_AMBULATORY_CARE_PROVIDER_SITE_OTHER): Payer: Self-pay | Admitting: Cardiology

## 2013-02-10 ENCOUNTER — Encounter (INDEPENDENT_AMBULATORY_CARE_PROVIDER_SITE_OTHER): Payer: Self-pay

## 2013-02-10 ENCOUNTER — Encounter: Payer: Self-pay | Admitting: Cardiology

## 2013-02-10 VITALS — BP 130/84 | HR 68 | Ht 67.0 in | Wt 251.0 lb

## 2013-02-10 DIAGNOSIS — E785 Hyperlipidemia, unspecified: Secondary | ICD-10-CM

## 2013-02-10 LAB — HEPATIC FUNCTION PANEL
ALT: 12 U/L (ref 0–35)
Albumin: 3.5 g/dL (ref 3.5–5.2)
Total Bilirubin: 0.4 mg/dL (ref 0.3–1.2)
Total Protein: 7.9 g/dL (ref 6.0–8.3)

## 2013-02-10 LAB — LIPID PANEL
Cholesterol: 146 mg/dL (ref 0–200)
HDL: 29.5 mg/dL — ABNORMAL LOW (ref >39.00)
VLDL: 38.2 mg/dL (ref 0.0–40.0)

## 2013-02-10 LAB — BASIC METABOLIC PANEL
GFR: 39.22 mL/min — ABNORMAL LOW (ref >60.00)
Potassium: 4.5 mEq/L (ref 3.5–5.1)
Sodium: 137 mEq/L (ref 135–145)

## 2013-02-10 MED ORDER — GLIPIZIDE 5 MG PO TABS: 10.0000 mg | ORAL_TABLET | Freq: Two times a day (BID) | ORAL | Status: DC

## 2013-02-10 NOTE — Assessment & Plan Note (Addendum)
Patient remains in sinus rhythm. Continue aspirin. She continues to decline Coumadin. She understands the increased risk of CVA. Continue beta blocker.

## 2013-02-10 NOTE — Assessment & Plan Note (Signed)
Continue aspirin and statin. Schedule functional study for risk stratification.

## 2013-02-10 NOTE — Patient Instructions (Addendum)
Your physician wants you to follow-up in: ONE YEAR WITH DR CRENSHAW You will receive a reminder letter in the mail two months in advance. If you don't receive a letter, please call our office to schedule the follow-up appointment.   Your physician has requested that you have a lexiscan myoview. For further information please visit [email or web address]. Please follow instruction sheet, as given.   Your physician recommends that you HAVE LAB WORK TODAY 

## 2013-02-10 NOTE — Assessment & Plan Note (Signed)
Blood pressure controlled. Continue present medications. Check potassium and renal function. 

## 2013-02-10 NOTE — Assessment & Plan Note (Signed)
Continue aspirin and statin. Await results of followup carotid Dopplers performed today. 

## 2013-02-10 NOTE — Assessment & Plan Note (Signed)
Continue statin. Check lipids and liver. 

## 2013-02-10 NOTE — Assessment & Plan Note (Signed)
Euvolemic on examination. Continue present dose of Lasix. Check potassium and renal function. 

## 2013-02-10 NOTE — Progress Notes (Signed)
HPI: Pleasant female with past medical history of coronary artery disease status post coronary bypass graft and atrial fibrillation for followup. Patient was admitted in Nov 2011 with atrial fibrillation with a rapid ventricular response. Troponin mildly increased. TSH normal. Echocardiogram in November of 2011 showed an ejection fraction of 45-50% with mild mitral regurgitation. Cardiac catheterization in Nov 2011 revealed an ejection fraction of 50%, severe three-vessel coronary artery disease status post 6-vessel coronary artery bypass graft with 6 patent bypass grafts. Severe disease in the proximal portion of the previously stented circumflex vessel that is unchanged from films in 2008 but is unprotected by a vein graft. Medical therapy was recommended. She had carotid Dopplers performed in June 2013, which showed 40-59% left and 60-79% right stenosis and f/u recommended in six months. The patient previously declined Coumadin because of financial issues. I last saw her in Sept of 2012. Since then, she has some dyspnea on exertion but no orthopnea or PND. Occasional mild pedal edema. No chest pain, palpitations or syncope.   Current Outpatient Prescriptions  Medication Sig Dispense Refill  . aspirin EC 325 MG tablet Take 325 mg by mouth 2 (two) times daily.       . enalapril (VASOTEC) 20 MG tablet TAKE ONE TABLET BY MOUTH EVERY DAY  30 tablet  11  . furosemide (LASIX) 40 MG tablet       . glipiZIDE (GLUCOTROL) 5 MG tablet Take 2 tablets (10 mg total) by mouth 2 (two) times daily before a meal.  120 tablet  3  . insulin NPH-insulin regular (NOVOLIN 70/30) (70-30) 100 UNIT/ML injection Inject 80 Units into the skin 2 (two) times daily with a meal.       . lovastatin (MEVACOR) 20 MG tablet TAKE ONE TABLET BY MOUTH AT BEDTIME  30 tablet  11  . metoprolol (LOPRESSOR) 50 MG tablet TAKE ONE & ONE-HALF TABLETS BY MOUTH TWICE DAILY  90 tablet  11  . Multiple Vitamin (MULTIVITAMIN) capsule Take 1 capsule  by mouth daily.         No current facility-administered medications for this visit.     Past Medical History  Diagnosis Date  . CAD (coronary artery disease)   . Atrial fibrillation   . CHF (congestive heart failure)   . Hyperlipidemia   . HTN (hypertension)   . MI (myocardial infarction)   . DM (diabetes mellitus)   . COPD (chronic obstructive pulmonary disease)   . Vertigo   . Anxiety   . Obesity   . Hx of CABG     Past Surgical History  Procedure Laterality Date  . Coronary artery bypass graft  12/2001  . S/p dilatation and curettage      History   Social History  . Marital Status: Married    Spouse Name: N/A    Number of Children: N/A  . Years of Education: N/A   Occupational History  . Not on file.   Social History Main Topics  . Smoking status: Former Smoker    Types: Cigarettes    Quit date: 09/01/1991  . Smokeless tobacco: Not on file     Comment: has a 30 pack year history, quit in late 1990s  . Alcohol Use: No  . Drug Use: No  . Sexually Active: Not on file   Other Topics Concern  . Not on file   Social History Narrative  . No narrative on file    ROS: back pain and arthralgias but no fevers or  chills, productive cough, hemoptysis, dysphasia, odynophagia, melena, hematochezia, dysuria, hematuria, rash, seizure activity, orthopnea, PND, pedal edema, claudication. Remaining systems are negative.  Physical Exam: Well-developed obese in no acute distress.  Skin is warm and dry.  HEENT is normal.  Neck is supple.  Chest is clear to auscultation with normal expansion.  Cardiovascular exam is regular rate and rhythm. 1/6 systolic murmur left sternal border. Abdominal exam nontender or distended. No masses palpated. Extremities show trace edema. neuro grossly intact  ECG sinus rhythm, left bundle branch block.

## 2013-02-11 ENCOUNTER — Telehealth: Payer: Self-pay | Admitting: Cardiology

## 2013-02-11 NOTE — Telephone Encounter (Signed)
Spoke with pt, aware of lab results. Per dr Jens Som the pt will cut her lasix to 20 mg once daily. She will have a bmp checked next week.

## 2013-02-11 NOTE — Telephone Encounter (Signed)
rtn your call to get test results

## 2013-02-17 ENCOUNTER — Other Ambulatory Visit (INDEPENDENT_AMBULATORY_CARE_PROVIDER_SITE_OTHER): Payer: Self-pay

## 2013-02-17 ENCOUNTER — Other Ambulatory Visit: Payer: Self-pay | Admitting: *Deleted

## 2013-02-17 LAB — BASIC METABOLIC PANEL
CO2: 25 mEq/L (ref 19–32)
Calcium: 9 mg/dL (ref 8.4–10.5)
Chloride: 100 mEq/L (ref 96–112)
Glucose, Bld: 117 mg/dL — ABNORMAL HIGH (ref 70–99)
Sodium: 134 mEq/L — ABNORMAL LOW (ref 135–145)

## 2013-02-21 ENCOUNTER — Other Ambulatory Visit (INDEPENDENT_AMBULATORY_CARE_PROVIDER_SITE_OTHER): Payer: Self-pay

## 2013-02-21 LAB — BASIC METABOLIC PANEL
BUN: 28 mg/dL — ABNORMAL HIGH (ref 6–23)
CO2: 27 mEq/L (ref 19–32)
Chloride: 103 mEq/L (ref 96–112)
Creatinine, Ser: 1.2 mg/dL (ref 0.4–1.2)
Glucose, Bld: 87 mg/dL (ref 70–99)
Potassium: 4.7 mEq/L (ref 3.5–5.1)

## 2013-03-10 ENCOUNTER — Encounter (HOSPITAL_COMMUNITY): Payer: Self-pay

## 2013-04-21 ENCOUNTER — Ambulatory Visit: Payer: Self-pay | Admitting: Family Medicine

## 2013-04-21 ENCOUNTER — Ambulatory Visit (INDEPENDENT_AMBULATORY_CARE_PROVIDER_SITE_OTHER): Payer: BC Managed Care – PPO | Admitting: Family Medicine

## 2013-04-21 ENCOUNTER — Encounter: Payer: Self-pay | Admitting: Family Medicine

## 2013-04-21 VITALS — BP 122/77 | HR 68 | Temp 97.7°F | Ht 66.5 in | Wt 254.0 lb

## 2013-04-21 DIAGNOSIS — I658 Occlusion and stenosis of other precerebral arteries: Secondary | ICD-10-CM

## 2013-04-21 DIAGNOSIS — F411 Generalized anxiety disorder: Secondary | ICD-10-CM

## 2013-04-21 DIAGNOSIS — I10 Essential (primary) hypertension: Secondary | ICD-10-CM

## 2013-04-21 DIAGNOSIS — Z1231 Encounter for screening mammogram for malignant neoplasm of breast: Secondary | ICD-10-CM

## 2013-04-21 DIAGNOSIS — N189 Chronic kidney disease, unspecified: Secondary | ICD-10-CM

## 2013-04-21 DIAGNOSIS — E119 Type 2 diabetes mellitus without complications: Secondary | ICD-10-CM

## 2013-04-21 DIAGNOSIS — E785 Hyperlipidemia, unspecified: Secondary | ICD-10-CM

## 2013-04-21 DIAGNOSIS — I4891 Unspecified atrial fibrillation: Secondary | ICD-10-CM

## 2013-04-21 DIAGNOSIS — I6529 Occlusion and stenosis of unspecified carotid artery: Secondary | ICD-10-CM | POA: Insufficient documentation

## 2013-04-21 DIAGNOSIS — J449 Chronic obstructive pulmonary disease, unspecified: Secondary | ICD-10-CM

## 2013-04-21 DIAGNOSIS — J4489 Other specified chronic obstructive pulmonary disease: Secondary | ICD-10-CM

## 2013-04-21 DIAGNOSIS — I6523 Occlusion and stenosis of bilateral carotid arteries: Secondary | ICD-10-CM

## 2013-04-21 DIAGNOSIS — I509 Heart failure, unspecified: Secondary | ICD-10-CM

## 2013-04-21 LAB — COMPREHENSIVE METABOLIC PANEL
ALT: 15 U/L (ref 0–35)
BUN: 34 mg/dL — ABNORMAL HIGH (ref 6–23)
CO2: 28 mEq/L (ref 19–32)
Calcium: 9.2 mg/dL (ref 8.4–10.5)
Chloride: 101 mEq/L (ref 96–112)
Creatinine, Ser: 1.3 mg/dL — ABNORMAL HIGH (ref 0.4–1.2)
GFR: 44.9 mL/min — ABNORMAL LOW (ref >60.00)
Glucose, Bld: 91 mg/dL (ref 70–99)
Total Bilirubin: 0.4 mg/dL (ref 0.3–1.2)

## 2013-04-21 MED ORDER — ALBUTEROL SULFATE HFA 108 (90 BASE) MCG/ACT IN AERS: 2.0000 | INHALATION_SPRAY | Freq: Four times a day (QID) | RESPIRATORY_TRACT | Status: DC | PRN

## 2013-04-21 MED ORDER — NITROGLYCERIN 0.4 MG SL SUBL: 0.4000 mg | SUBLINGUAL_TABLET | SUBLINGUAL | Status: DC | PRN

## 2013-04-21 NOTE — Progress Notes (Signed)
HPI:   Cassandra Fisher 34 female with past medical history of coronary artery disease status post coronary bypass graf, Afib (followed by Dr. Jens Som), DM, CRF here to establish care.   Has not had a PCP in years due to financial issues.  Due to her serious cardiac issues, she has been seeing cards regularly.  DM-has not been followed by a doctor in years but cardiology has been refilling rx since she could not afford to see PCP. Glipizide 5 mg twice daily, Insuilin- Novolin N- 80 units twice daily. Checks CBGs twice daily- fasting CBG typically 80- 120. Lab Results  Component Value Date   HGBA1C  Value: 7.9 (NOTE)                                                                       According to the ADA Clinical Practice Recommendations for 2011, when HbA1c is used as a screening test:   >=6.5%   Diagnostic of Diabetes Mellitus           (if abnormal result  is confirmed)  5.7-6.4%   Increased risk of developing Diabetes Mellitus  References:Diagnosis and Classification of Diabetes Mellitus,Diabetes Care,2011,34(Suppl 1):S62-S69 and Standards of Medical Care in         Diabetes - 2011,Diabetes Care,2011,34  (Suppl 1):S11-S61.* 10/22/2010   CRF- likely due to DM and diuretic use. Lab Results  Component Value Date   CREATININE 1.3* 04/21/2013   Has never had a mammogram or colonoscopy.  Has not had a pap smear in over 5 years.  Current Outpatient Prescriptions  Medication Sig Dispense Refill  . albuterol (PROVENTIL HFA;VENTOLIN HFA) 108 (90 BASE) MCG/ACT inhaler Inhale 2 puffs into the lungs every 6 (six) hours as needed for wheezing.      Marland Kitchen aspirin EC 325 MG tablet Take 325 mg by mouth 2 (two) times daily.       . enalapril (VASOTEC) 20 MG tablet TAKE ONE TABLET BY MOUTH EVERY DAY  30 tablet  11  . furosemide (LASIX) 40 MG tablet Take 40 mg by mouth daily.       Marland Kitchen glipiZIDE (GLUCOTROL) 5 MG tablet Take 2 tablets (10 mg total) by mouth 2 (two) times daily before a meal.  120 tablet  3  . insulin  NPH-insulin regular (NOVOLIN 70/30) (70-30) 100 UNIT/ML injection Inject 80 Units into the skin 2 (two) times daily with a meal.       . lovastatin (MEVACOR) 20 MG tablet TAKE ONE TABLET BY MOUTH AT BEDTIME  30 tablet  11  . metoprolol (LOPRESSOR) 50 MG tablet TAKE ONE & ONE-HALF TABLETS BY MOUTH TWICE DAILY  90 tablet  11  . Multiple Vitamin (MULTIVITAMIN) capsule Take 1 capsule by mouth daily.         No current facility-administered medications for this visit.     Past Medical History  Diagnosis Date  . CAD (coronary artery disease)   . Atrial fibrillation   . CHF (congestive heart failure)   . Hyperlipidemia   . HTN (hypertension)   . MI (myocardial infarction)   . DM (diabetes mellitus)   . COPD (chronic obstructive pulmonary disease)   . Vertigo   . Anxiety   . Obesity   .  Hx of CABG     Past Surgical History  Procedure Laterality Date  . Coronary artery bypass graft  12/2001  . S/p dilatation and curettage      History   Social History  . Marital Status: Married    Spouse Name: N/A    Number of Children: N/A  . Years of Education: N/A   Occupational History  . Not on file.   Social History Main Topics  . Smoking status: Former Smoker    Types: Cigarettes    Quit date: 09/01/1991  . Smokeless tobacco: Not on file     Comment: has a 30 pack year history, quit in late 1990s  . Alcohol Use: No  . Drug Use: No  . Sexually Active: Not on file   Other Topics Concern  . Not on file   Social History Narrative  . No narrative on file    ROS:  See HPI No changes in bowel habits No post menopausal bleeding  Physical Exam: BP 122/77  Pulse 68  Temp(Src) 97.7 F (36.5 C)  Ht 5' 6.5" (1.689 m)  Wt 254 lb (115.214 kg)  BMI 40.39 kg/m2  Well-developed obese in no acute distress.  Skin is warm and dry.  HEENT is normal.  Neck is supple.  Chest is clear to auscultation with normal expansion.  Cardiovascular exam is regular rate and rhythm. 1/6 systolic  murmur left sternal border. Abdominal exam nontender or distended. No masses palpated. Extremities show trace edema. neuro grossly intact  Assessment and Plan:  1. HYPERTENSION Stable.  On ACEI. - Comprehensive metabolic panel  2. HYPERLIPIDEMIA Well controlled on Mevacor.  3. COPD Has not been on inhaled steroid.  Uses rare rescue inhaler.  4. CHF Followed by cards.  5. Atrial fibrillation Rate and rhythm controlled.   6. DM Check a1c- has not been checked in years.  Likely will need med adjustment.  On ACEI. - Hemoglobin A1c  8. CRF (chronic renal failure)  - Comprehensive metabolic panel  9. Carotid stenosis, bilateral Not yet surgical candidate.  10. Other screening mammogram  - MM Digital Screening; Future

## 2013-04-21 NOTE — Patient Instructions (Addendum)
Please schedule a complete physical appointment with me- we are going to do a pap smear that day.  We will call you with your lab results.  Please stop by to see Shirlee Limerick on your way out to set up your referral for your mammogram.

## 2013-04-22 ENCOUNTER — Telehealth: Payer: Self-pay | Admitting: *Deleted

## 2013-04-22 MED ORDER — INSULIN NPH ISOPHANE & REGULAR (70-30) 100 UNIT/ML ~~LOC~~ SUSP: 82.0000 [IU] | Freq: Two times a day (BID) | SUBCUTANEOUS | Status: DC

## 2013-04-22 NOTE — Telephone Encounter (Signed)
We have samples of ventolin 90 mcg, ok to give one to patient?

## 2013-04-22 NOTE — Telephone Encounter (Signed)
Pt is asking for a prescription for insulin.  She states that she has been getting this at [facility] without a prescription for $24.00, and wonders if she can get it for less with a script.  I checked with [facility] and they do sale their brand, relion, without a prescription.

## 2013-04-22 NOTE — Telephone Encounter (Signed)
Really?  I had no idea they could do this.  Yes, ok to send script at entered in epic.

## 2013-04-22 NOTE — Telephone Encounter (Signed)
Yes ok to give

## 2013-04-22 NOTE — Telephone Encounter (Signed)
One sample box given.  Lot number [id number], exp 05/2014.

## 2013-04-22 NOTE — Telephone Encounter (Signed)
Medicine called to [facility]. 

## 2013-04-25 ENCOUNTER — Other Ambulatory Visit: Payer: Self-pay | Admitting: *Deleted

## 2013-04-25 MED ORDER — INSULIN NPH ISOPHANE & REGULAR (70-30) 100 UNIT/ML ~~LOC~~ SUSP: 82.0000 [IU] | Freq: Two times a day (BID) | SUBCUTANEOUS | Status: DC

## 2013-05-17 ENCOUNTER — Encounter: Payer: Self-pay | Admitting: Family Medicine

## 2013-05-17 ENCOUNTER — Ambulatory Visit: Payer: Self-pay | Admitting: Family Medicine

## 2013-05-18 ENCOUNTER — Ambulatory Visit: Payer: Self-pay | Admitting: Family Medicine

## 2013-05-18 ENCOUNTER — Encounter: Payer: Self-pay | Admitting: *Deleted

## 2013-05-18 ENCOUNTER — Encounter: Payer: Self-pay | Admitting: Family Medicine

## 2013-06-15 ENCOUNTER — Encounter: Payer: Self-pay | Admitting: Family Medicine

## 2013-06-15 ENCOUNTER — Encounter: Payer: BC Managed Care – PPO | Admitting: Family Medicine

## 2013-06-15 ENCOUNTER — Ambulatory Visit (INDEPENDENT_AMBULATORY_CARE_PROVIDER_SITE_OTHER): Payer: BC Managed Care – PPO | Admitting: Family Medicine

## 2013-06-15 ENCOUNTER — Telehealth: Payer: Self-pay | Admitting: *Deleted

## 2013-06-15 VITALS — BP 130/70 | HR 61 | Temp 97.8°F

## 2013-06-15 DIAGNOSIS — M549 Dorsalgia, unspecified: Secondary | ICD-10-CM

## 2013-06-15 MED ORDER — CYCLOBENZAPRINE HCL 5 MG PO TABS: 5.0000 mg | ORAL_TABLET | Freq: Three times a day (TID) | ORAL | Status: DC | PRN

## 2013-06-15 NOTE — Assessment & Plan Note (Signed)
Chronic back pain with flare.  No clear radiculopathy.  Most consistent with muscle spasm form inactivity, poor conditioning and poor back mechanics.  Treat with muscle relaxant, tylenol  BID, start home PT as pt refused referral to PT. Follow up with Dr. Dayton Martes in 2 weeks.

## 2013-06-15 NOTE — Progress Notes (Signed)
  Subjective:    Patient ID: Cassandra Fisher, female    DOB: Feb 14, 1951, 63 y.o.   MRN: [id number]  HPI  62 year old female  pt of Dr. Dayton Martes with history of chronic back pain x 1 year presents with  upper, middle and low back pain and spasms.  Worse for several weeks. More frequent  Back spasms now.  Pain worse with leaning over. Has diabetic neuropathy.. No new changes. No leg weakness but in wheel chair today because of back pain.  No past back surgery. No change in activity. No recent injury or fall.  Using 2 tyelnol arthritis strength every 8 hours in last few days. Never done PT.  She chronic weakness from heart issue.   Review of Systems  Constitutional: Negative for fever and fatigue.  HENT: Negative for ear pain.   Eyes: Negative for pain.  Respiratory: Negative for chest tightness and shortness of breath.   Cardiovascular: Negative for chest pain, palpitations and leg swelling.  Gastrointestinal: Negative for abdominal pain.  Genitourinary: Negative for dysuria.       Objective:   Physical Exam  Constitutional: She is oriented to person, place, and time. Vital signs are normal. She appears well-developed and well-nourished. She is cooperative.  Non-toxic appearance. She does not appear ill. No distress.  Morbidly obese  HENT:  Head: Normocephalic.  Right Ear: Hearing, tympanic membrane, external ear and ear canal normal. Tympanic membrane is not erythematous, not retracted and not bulging.  Left Ear: Hearing, tympanic membrane, external ear and ear canal normal. Tympanic membrane is not erythematous, not retracted and not bulging.  Nose: No mucosal edema or rhinorrhea. Right sinus exhibits no maxillary sinus tenderness and no frontal sinus tenderness. Left sinus exhibits no maxillary sinus tenderness and no frontal sinus tenderness.  Mouth/Throat: Uvula is midline, oropharynx is clear and moist and mucous membranes are normal.  Eyes: Conjunctivae, EOM and lids are  normal. Pupils are equal, round, and reactive to light. No foreign bodies found.  Neck: Trachea normal and normal range of motion. Neck supple. Carotid bruit is not present. No mass and no thyromegaly present.  Cardiovascular: Normal rate, regular rhythm, S1 normal, S2 normal, normal heart sounds, intact distal pulses and normal pulses.   Pulmonary/Chest: Effort normal and breath sounds normal. Not tachypneic. No respiratory distress. She has no decreased breath sounds. She has no wheezes. She has no rhonchi. She has no rales.  Abdominal: Soft. Normal appearance and bowel sounds are normal. There is no tenderness.  Musculoskeletal:       Cervical back: She exhibits decreased range of motion and tenderness. She exhibits no bony tenderness.       Thoracic back: She exhibits decreased range of motion and tenderness. She exhibits no bony tenderness.       Lumbar back: She exhibits decreased range of motion and tenderness. She exhibits no bony tenderness.  Neg SLR  Neurological: She is alert and oriented to person, place, and time. She has normal strength. A sensory deficit is present. No cranial nerve deficit. She exhibits normal muscle tone. Gait abnormal. Coordination normal.  Mildly decreased monofilament in B feet  Skin: Skin is warm, dry and intact. No rash noted.  Psychiatric: Her speech is normal and behavior is normal. Judgment and thought content normal. Her mood appears not anxious. Cognition and memory are normal. She does not exhibit a depressed mood.          Assessment & Plan:

## 2013-06-15 NOTE — Telephone Encounter (Signed)
Opened in error

## 2013-06-15 NOTE — Patient Instructions (Addendum)
Use tylenol arthritis 2 tabs every 8 hours for pain. Start muscle relaxant 5 mg every 8 hours for spasm. Start physical therapy at home ASAP. Follow up with Dr. Dayton Martes in 2 weeks....for pap smear as well.

## 2013-06-23 ENCOUNTER — Other Ambulatory Visit: Payer: Self-pay

## 2013-06-23 MED ORDER — CYCLOBENZAPRINE HCL 5 MG PO TABS: 5.0000 mg | ORAL_TABLET | Freq: Three times a day (TID) | ORAL | Status: DC | PRN

## 2013-06-23 NOTE — Telephone Encounter (Signed)
Called patient to advise her that refill was sent to pharmacy but no answer and voice mail has not been set up yet.

## 2013-06-23 NOTE — Telephone Encounter (Signed)
Pt request refill cyclobenzaprine to [facility] [address] for back spasms; pt said spasms are better but only has 4 pills left and does not think that will last until pt seen on 06/27/13 with Dr Dayton Martes.Please advise.

## 2013-06-27 ENCOUNTER — Encounter: Payer: BC Managed Care – PPO | Admitting: Family Medicine

## 2013-07-07 ENCOUNTER — Other Ambulatory Visit: Payer: Self-pay | Admitting: *Deleted

## 2013-07-07 MED ORDER — CYCLOBENZAPRINE HCL 5 MG PO TABS: 5.0000 mg | ORAL_TABLET | Freq: Three times a day (TID) | ORAL | Status: DC | PRN

## 2013-07-07 NOTE — Telephone Encounter (Signed)
Faxed refill request for flexeril, last filled 30 on 06/23/13.

## 2013-08-19 ENCOUNTER — Other Ambulatory Visit: Payer: Self-pay | Admitting: *Deleted

## 2013-08-19 ENCOUNTER — Other Ambulatory Visit: Payer: Self-pay | Admitting: Cardiology

## 2013-08-19 MED ORDER — CYCLOBENZAPRINE HCL 5 MG PO TABS: 5.0000 mg | ORAL_TABLET | Freq: Three times a day (TID) | ORAL | Status: DC | PRN

## 2013-08-19 NOTE — Telephone Encounter (Signed)
Last filled 07/07/13 

## 2013-08-31 ENCOUNTER — Encounter (INDEPENDENT_AMBULATORY_CARE_PROVIDER_SITE_OTHER): Payer: BC Managed Care – PPO

## 2013-08-31 DIAGNOSIS — I6529 Occlusion and stenosis of unspecified carotid artery: Secondary | ICD-10-CM

## 2013-09-06 ENCOUNTER — Encounter: Payer: Self-pay | Admitting: Family Medicine

## 2013-09-06 ENCOUNTER — Ambulatory Visit (INDEPENDENT_AMBULATORY_CARE_PROVIDER_SITE_OTHER): Payer: BC Managed Care – PPO | Admitting: Family Medicine

## 2013-09-06 ENCOUNTER — Other Ambulatory Visit (HOSPITAL_COMMUNITY)
Admission: RE | Admit: 2013-09-06 | Discharge: 2013-09-06 | Disposition: A | Discharge status: Home / Self Care | Payer: BC Managed Care – PPO | Source: Ambulatory Visit | Attending: Family Medicine | Admitting: Family Medicine

## 2013-09-06 VITALS — BP 126/82 | HR 72 | Temp 97.9°F | Ht 66.5 in | Wt 253.5 lb

## 2013-09-06 DIAGNOSIS — E785 Hyperlipidemia, unspecified: Secondary | ICD-10-CM

## 2013-09-06 DIAGNOSIS — R5381 Other malaise: Secondary | ICD-10-CM

## 2013-09-06 DIAGNOSIS — R21 Rash and other nonspecific skin eruption: Secondary | ICD-10-CM | POA: Insufficient documentation

## 2013-09-06 DIAGNOSIS — Z0000 Encounter for general adult medical examination without abnormal findings: Secondary | ICD-10-CM | POA: Insufficient documentation

## 2013-09-06 DIAGNOSIS — Z1151 Encounter for screening for human papillomavirus (HPV): Secondary | ICD-10-CM | POA: Insufficient documentation

## 2013-09-06 DIAGNOSIS — Z1211 Encounter for screening for malignant neoplasm of colon: Secondary | ICD-10-CM

## 2013-09-06 DIAGNOSIS — I10 Essential (primary) hypertension: Secondary | ICD-10-CM

## 2013-09-06 DIAGNOSIS — Z23 Encounter for immunization: Secondary | ICD-10-CM

## 2013-09-06 DIAGNOSIS — Z01419 Encounter for gynecological examination (general) (routine) without abnormal findings: Secondary | ICD-10-CM | POA: Insufficient documentation

## 2013-09-06 DIAGNOSIS — E119 Type 2 diabetes mellitus without complications: Secondary | ICD-10-CM

## 2013-09-06 DIAGNOSIS — R300 Dysuria: Secondary | ICD-10-CM | POA: Insufficient documentation

## 2013-09-06 LAB — CBC WITH DIFFERENTIAL/PLATELET
Basophils Absolute: 0 10*3/uL (ref 0.0–0.1)
Basophils Relative: 0.4 % (ref 0.0–3.0)
Eosinophils Absolute: 0.2 10*3/uL (ref 0.0–0.7)
Lymphocytes Relative: 30.9 % (ref 12.0–46.0)
MCHC: 33.1 g/dL (ref 30.0–36.0)
MCV: 85.9 fl (ref 78.0–100.0)
Monocytes Absolute: 0.6 10*3/uL (ref 0.1–1.0)
Neutrophils Relative %: 60.8 % (ref 43.0–77.0)
Platelets: 254 10*3/uL (ref 150.0–400.0)
RBC: 4.55 Mil/uL (ref 3.87–5.11)
RDW: 17.2 % — ABNORMAL HIGH (ref 11.5–14.6)

## 2013-09-06 LAB — COMPREHENSIVE METABOLIC PANEL
AST: 25 U/L (ref 0–37)
Albumin: 3.7 g/dL (ref 3.5–5.2)
Alkaline Phosphatase: 106 U/L (ref 39–117)
BUN: 48 mg/dL — ABNORMAL HIGH (ref 6–23)
CO2: 26 mEq/L (ref 19–32)
Calcium: 9.7 mg/dL (ref 8.4–10.5)
Chloride: 103 mEq/L (ref 96–112)
GFR: 36.78 mL/min — ABNORMAL LOW (ref >60.00)
Glucose, Bld: 153 mg/dL — ABNORMAL HIGH (ref 70–99)
Potassium: 4.9 mEq/L (ref 3.5–5.1)
Sodium: 138 mEq/L (ref 135–145)
Total Protein: 8.1 g/dL (ref 6.0–8.3)

## 2013-09-06 LAB — LIPID PANEL
Cholesterol: 174 mg/dL (ref 0–200)
Total CHOL/HDL Ratio: 5
VLDL: 64.8 mg/dL — ABNORMAL HIGH (ref 0.0–40.0)

## 2013-09-06 LAB — HEMOGLOBIN A1C: Hgb A1c MFr Bld: 9.2 % — ABNORMAL HIGH (ref 4.6–6.5)

## 2013-09-06 LAB — POCT URINALYSIS DIPSTICK
Glucose, UA: NEGATIVE
Ketones, UA: NEGATIVE
Nitrite, UA: NEGATIVE
Protein, UA: NEGATIVE
Spec Grav, UA: 1.01
Urobilinogen, UA: 0.2

## 2013-09-06 LAB — SEDIMENTATION RATE: Sed Rate: 82 mm/hr — ABNORMAL HIGH (ref 0–22)

## 2013-09-06 LAB — LDL CHOLESTEROL, DIRECT: Direct LDL: 84.6 mg/dL

## 2013-09-06 NOTE — Progress Notes (Signed)
Pleasant 66 female with past medical history of coronary artery disease status post coronary bypass graf, Afib (followed by Dr. Jens Som), DM, CRF here for CPX.  Has not had a pap smear in over 40 years.  No h/o post menopausal bleeding.  DM-has not been well controlled.   Glipizide 5 mg twice daily, Insuilin- Novolin N- 82 units twice daily. Checks CBGs twice daily- fasting CBG typically 90- 140.  Did have one episode of hypoglycemia but symptoms resolved with juice. Lab Results  Component Value Date   HGBA1C 8.5* 04/21/2013   CRF- likely due to DM and diuretic use. Lab Results  Component Value Date   CREATININE 1.3* 04/21/2013   Had mammogram in 04/2013 after she established care with me. Refusing colonoscopy, will to do IFOB.  Denies any blood in her stool or changes in her bowel habits.  Facial rash- past several weeks, noticed red rash, sometimes irritated on cheeks and forehead.  No new lotions or soaps. Daughter has IBD.  No known FH of lupus.  She did have some dysuria- took two of daughter amoxicillin and symptoms improved now.  No fevers.  Does have chronic back pain- no changes.  Current Outpatient Prescriptions  Medication Sig Dispense Refill  . acetaminophen (TYLENOL) 650 MG suppository Place 1,300 mg rectally 2 (two) times daily.      Marland Kitchen albuterol (PROVENTIL HFA;VENTOLIN HFA) 108 (90 BASE) MCG/ACT inhaler Inhale 2 puffs into the lungs every 6 (six) hours as needed for wheezing.  1 Inhaler  3  . aspirin EC 325 MG tablet Take 325 mg by mouth daily.       . cyclobenzaprine (FLEXERIL) 5 MG tablet Take 1-2 tablets (5-10 mg total) by mouth 3 (three) times daily as needed for muscle spasms.  30 tablet  0  . enalapril (VASOTEC) 20 MG tablet TAKE ONE TABLET BY MOUTH EVERY DAY  30 tablet  11  . furosemide (LASIX) 40 MG tablet Take 40 mg by mouth daily.       Marland Kitchen glipiZIDE (GLUCOTROL) 5 MG tablet Take 2 tablets (10 mg total) by mouth 2 (two) times daily before a meal.  120 tablet  3  .  insulin NPH (HUMULIN N,NOVOLIN N) 100 UNIT/ML injection Inject 80 Units into the skin 2 (two) times daily.      Marland Kitchen lovastatin (MEVACOR) 20 MG tablet TAKE ONE TABLET BY MOUTH AT BEDTIME  30 tablet  11  . metoprolol (LOPRESSOR) 50 MG tablet TAKE ONE & ONE-HALF TABLETS BY MOUTH TWICE DAILY  90 tablet  11  . Multiple Vitamin (MULTIVITAMIN) capsule Take 1 capsule by mouth daily.        . nitroGLYCERIN (NITROSTAT) 0.4 MG SL tablet Place 1 tablet (0.4 mg total) under the tongue every 5 (five) minutes as needed for chest pain.  25 tablet  3   No current facility-administered medications for this visit.     Past Medical History  Diagnosis Date  . CAD (coronary artery disease)   . Atrial fibrillation   . CHF (congestive heart failure)   . Hyperlipidemia   . HTN (hypertension)   . MI (myocardial infarction)   . DM (diabetes mellitus)   . COPD (chronic obstructive pulmonary disease)   . Vertigo   . Anxiety   . Obesity   . Hx of CABG     Past Surgical History  Procedure Laterality Date  . Coronary artery bypass graft  12/2001  . S/p dilatation and curettage  History   Social History  . Marital Status: Married    Spouse Name: N/A    Number of Children: N/A  . Years of Education: N/A   Occupational History  . Not on file.   Social History Main Topics  . Smoking status: Former Smoker    Types: Cigarettes    Quit date: 09/01/1991  . Smokeless tobacco: Not on file     Comment: has a 30 pack year history, quit in late 1990s  . Alcohol Use: No  . Drug Use: No  . Sexual Activity: Not on file   Other Topics Concern  . Not on file   Social History Narrative  . No narrative on file    ROS:  See HPI No changes in bowel habits No post menopausal bleeding  Physical Exam: BP 126/82  Pulse 72  Temp(Src) 97.9 F (36.6 C) (Oral)  Ht 5' 6.5" (1.689 m)  Wt 253 lb 8 oz (114.987 kg)  BMI 40.31 kg/m2   General:  Well-developed,well-nourished,in no acute distress;  alert,appropriate and cooperative throughout examination Head:  normocephalic and atraumatic.   Eyes:  vision grossly intact, pupils equal, pupils round, and pupils reactive to light.   Ears:  R ear normal and L ear normal.   Nose:  no external deformity.   Mouth:  good dentition.   Neck:  No deformities, masses, or tenderness noted. Breasts:  No mass, nodules, thickening, tenderness, bulging, retraction, inflamation, nipple discharge or skin changes noted.   Lungs:  Normal respiratory effort, chest expands symmetrically. Lungs are clear to auscultation, no crackles or wheezes. Heart:  Normal rate and regular rhythm. S1 and S2 normal without gallop, murmur, click, rub or other extra sounds. Abdomen:  Bowel sounds positive,abdomen soft and non-tender without masses, organomegaly or hernias noted. Rectal:  no external abnormalities.   Genitalia:  Pelvic Exam:        External: normal female genitalia without lesions or masses        Vagina: normal without lesions or masses        Cervix: normal without lesions or masses        Adnexa: normal bimanual exam without masses or fullness        Uterus: normal by palpation        Pap smear: performed Msk:  No deformity or scoliosis noted of thoracic or lumbar spine.   Extremities:  No clubbing, cyanosis, edema, or deformity noted with normal full range of motion of all joints.   Neurologic:  alert & oriented X3 and gait normal.   Skin:   Malar raised rash on cheeks bilaterally and forehead Cervical Nodes:  No lymphadenopathy noted Axillary Nodes:  No palpable lymphadenopathy Psych:  Cognition and judgment appear intact. Alert and cooperative with normal attention span and concentration. No apparent delusions, illusions, hallucinations   Assessment and Plan:  1. HYPERTENSION Stable.  On ACEI. - Comprehensive metabolic panel  2. HYPERLIPIDEMIA  Lab Results  Component Value Date   CHOL 146 02/10/2013   HDL 29.50* 02/10/2013   LDLCALC 78  02/10/2013   TRIG 191.0* 02/10/2013   CHOLHDL 5 02/10/2013     3. DM Poorly controlled.  Recheck a1c today. - Hemoglobin A1c - HM Diabetes Foot Exam  4. Routine general medical examination at a health care facility Reviewed preventive care protocols, scheduled due services, and updated immunizations Discussed nutrition, exercise, diet, and healthy lifestyle.  - Cytology - PAP  5. Other malaise and fatigue  - Vitamin D,  25-hydroxy  6. Malar rash  Allergic/contact dermatitis vs rheum rash.  Will check labs, advised not using any new soaps or lotions. - ANA - Sedimentation Rate - Cyclic Citrul Peptide Antibody, IGG - CBC with Differential  7. Burning with urination Reassurance provided.  UA neg.  If symptoms return, she will call us. - Urinalysis Dipstick  8. Special screening for malignant neoplasms, colon  - Fecal occult blood, imunochemical; Future   9. Need for prophylactic vaccination and inoculation against influenza  - Flu Vaccine QUAD 36+ mos PF IM (Fluarix)  10. Atrial fibrillation Rate and rhythm controlled.

## 2013-09-06 NOTE — Patient Instructions (Addendum)
Good to see you. We will call you with your lab and pap smear results- we may need to adjust your insulin again.

## 2013-09-07 LAB — ANA: Anti Nuclear Antibody(ANA): NEGATIVE

## 2013-09-08 ENCOUNTER — Other Ambulatory Visit (INDEPENDENT_AMBULATORY_CARE_PROVIDER_SITE_OTHER): Payer: BC Managed Care – PPO

## 2013-09-08 ENCOUNTER — Other Ambulatory Visit: Payer: Self-pay | Admitting: Family Medicine

## 2013-09-08 DIAGNOSIS — IMO0002 Reserved for concepts with insufficient information to code with codable children: Secondary | ICD-10-CM

## 2013-09-08 DIAGNOSIS — Z1211 Encounter for screening for malignant neoplasm of colon: Secondary | ICD-10-CM

## 2013-09-08 DIAGNOSIS — R87612 Low grade squamous intraepithelial lesion on cytologic smear of cervix (LGSIL): Secondary | ICD-10-CM

## 2013-09-09 ENCOUNTER — Telehealth: Payer: Self-pay | Admitting: *Deleted

## 2013-09-09 ENCOUNTER — Other Ambulatory Visit: Payer: Self-pay | Admitting: *Deleted

## 2013-09-09 ENCOUNTER — Encounter: Payer: Self-pay | Admitting: Family Medicine

## 2013-09-09 ENCOUNTER — Other Ambulatory Visit: Payer: Self-pay | Admitting: Family Medicine

## 2013-09-09 DIAGNOSIS — E119 Type 2 diabetes mellitus without complications: Secondary | ICD-10-CM

## 2013-09-09 DIAGNOSIS — R700 Elevated erythrocyte sedimentation rate: Secondary | ICD-10-CM

## 2013-09-09 DIAGNOSIS — R195 Other fecal abnormalities: Secondary | ICD-10-CM

## 2013-09-09 LAB — FECAL OCCULT BLOOD, IMMUNOCHEMICAL: Fecal Occult Bld: POSITIVE

## 2013-09-09 MED ORDER — INSULIN NPH (HUMAN) (ISOPHANE) 100 UNIT/ML ~~LOC~~ SUSP: 82.0000 [IU] | Freq: Two times a day (BID) | SUBCUTANEOUS | Status: DC

## 2013-09-09 MED ORDER — VITAMIN D (ERGOCALCIFEROL) 1.25 MG (50000 UNIT) PO CAPS: 50000.0000 [IU] | ORAL_CAPSULE | ORAL | Status: DC

## 2013-09-09 NOTE — Telephone Encounter (Signed)
Patient also asked about her cholesterol when I spoke to her about her other lab results.

## 2013-09-09 NOTE — Telephone Encounter (Signed)
error 

## 2013-09-09 NOTE — Telephone Encounter (Signed)
Patient notified

## 2013-09-09 NOTE — Telephone Encounter (Signed)
Her bad cholesterol (LDL) is quite good.  Triglycerides very elevated which is not surprising since they are made up of sugars and her diabetes is not well controlled.  This should improve after she sees an endocrinologist and her diabetes is controlled.

## 2013-09-12 ENCOUNTER — Other Ambulatory Visit: Payer: Self-pay | Admitting: *Deleted

## 2013-09-12 MED ORDER — INSULIN NPH (HUMAN) (ISOPHANE) 100 UNIT/ML ~~LOC~~ SUSP: 82.0000 [IU] | Freq: Two times a day (BID) | SUBCUTANEOUS | Status: AC

## 2013-09-12 NOTE — Telephone Encounter (Signed)
Pharmacy sent fax requesting an increased quantity. Quantity prescribed was only a 6 day supply. New rx with increased quantity sent to pharmacy.

## 2013-09-14 NOTE — Telephone Encounter (Signed)
Pt said she picked up 1 vial of  humulin N. Spoke with Jimmi pharmacist at [facility] [address]. Jimmi said HumulinN and Novolin N are interchangeable. Jimmi did find refill of Novolin N 5 vials. Pt will go back to [facility] to speak with Jimmi and take back Humulin N and get 5 vials of Novolin N. Pt also wanted to know after 6 weeks of Vit D 50,000, how often is pt to take Vit D 1600 IU. Advised pt per result note after finishes Vit D 50,000 units pt to take Vit D 1600 IU daily. Pt voiced understanding.

## 2013-09-15 ENCOUNTER — Other Ambulatory Visit: Payer: Self-pay | Admitting: Cardiology

## 2013-09-16 ENCOUNTER — Other Ambulatory Visit: Payer: Self-pay

## 2013-09-16 ENCOUNTER — Other Ambulatory Visit: Payer: Self-pay | Admitting: Cardiology

## 2013-09-16 MED ORDER — FUROSEMIDE 40 MG PO TABS: 40.0000 mg | ORAL_TABLET | Freq: Every day | ORAL | Status: DC

## 2013-09-16 NOTE — Telephone Encounter (Signed)
Ok to refill one time only.  Given her elevation in Cr, I would prefer that she get this from Dr. Jens Som.  He may way to change dose.

## 2013-09-16 NOTE — Telephone Encounter (Signed)
Pt request refill furosemide 40 mg taking 1 tab daily; pt said Dr Jens Som had been filling but had asked Dr Dayton Martes to start filling for pt. Pt said there was concern about kidney function but pt said she was told she could take furosemide. Pt said [facility] [address] was supposed to request refill from Dr Dayton Martes. Pt request furosemide sent to [facility] [address]. Pt has enough med to last until 09/20/13.

## 2013-09-20 ENCOUNTER — Other Ambulatory Visit (INDEPENDENT_AMBULATORY_CARE_PROVIDER_SITE_OTHER): Payer: BC Managed Care – PPO

## 2013-09-20 DIAGNOSIS — R195 Other fecal abnormalities: Secondary | ICD-10-CM

## 2013-09-20 LAB — FECAL OCCULT BLOOD, IMMUNOCHEMICAL: Fecal Occult Bld: POSITIVE

## 2013-09-21 ENCOUNTER — Other Ambulatory Visit: Payer: Self-pay | Admitting: Family Medicine

## 2013-09-21 DIAGNOSIS — R195 Other fecal abnormalities: Secondary | ICD-10-CM

## 2013-09-28 ENCOUNTER — Encounter: Payer: Self-pay | Admitting: Internal Medicine

## 2013-10-03 ENCOUNTER — Other Ambulatory Visit: Payer: Self-pay | Admitting: Family Medicine

## 2013-10-03 NOTE — Telephone Encounter (Signed)
Last office visit 09/06/2013.  Ok to refill? 

## 2013-10-03 NOTE — Telephone Encounter (Signed)
Ok to refill one time only.  Needs to be seen for further refills. 

## 2013-10-18 ENCOUNTER — Other Ambulatory Visit: Payer: Self-pay | Admitting: Cardiology

## 2013-10-19 ENCOUNTER — Other Ambulatory Visit: Payer: Self-pay

## 2013-10-19 MED ORDER — FUROSEMIDE 40 MG PO TABS: 40.0000 mg | ORAL_TABLET | Freq: Every day | ORAL | Status: DC

## 2013-11-04 ENCOUNTER — Other Ambulatory Visit: Payer: Self-pay | Admitting: *Deleted

## 2013-11-04 NOTE — Telephone Encounter (Signed)
Rx for cyclobenzaprine denied with a note that pt needs an appt first.

## 2013-11-09 ENCOUNTER — Other Ambulatory Visit: Payer: Self-pay | Admitting: Family Medicine

## 2013-11-09 ENCOUNTER — Other Ambulatory Visit: Payer: Self-pay

## 2013-11-09 DIAGNOSIS — E559 Vitamin D deficiency, unspecified: Secondary | ICD-10-CM

## 2013-11-09 MED ORDER — CYCLOBENZAPRINE HCL 5 MG PO TABS: ORAL_TABLET | ORAL | Status: DC

## 2013-11-09 NOTE — Telephone Encounter (Signed)
Pt left v/m requesting refill cyclobenzaprine due to back pain. Pt request cb when refilled.[facility] [address].

## 2013-11-09 NOTE — Telephone Encounter (Signed)
Phoned in to pharmacy. 

## 2013-11-15 ENCOUNTER — Other Ambulatory Visit: Payer: BC Managed Care – PPO

## 2013-11-15 ENCOUNTER — Other Ambulatory Visit: Payer: Self-pay | Admitting: Cardiology

## 2013-11-16 ENCOUNTER — Other Ambulatory Visit: Payer: Self-pay | Admitting: Cardiology

## 2013-11-16 ENCOUNTER — Ambulatory Visit: Payer: BC Managed Care – PPO | Admitting: Internal Medicine

## 2013-11-18 ENCOUNTER — Other Ambulatory Visit: Payer: Self-pay | Admitting: *Deleted

## 2013-11-18 MED ORDER — GLIPIZIDE 5 MG PO TABS: ORAL_TABLET | ORAL | Status: DC

## 2013-11-22 ENCOUNTER — Other Ambulatory Visit: Payer: BC Managed Care – PPO

## 2013-12-02 ENCOUNTER — Encounter: Payer: BC Managed Care – PPO | Admitting: Internal Medicine

## 2013-12-20 ENCOUNTER — Other Ambulatory Visit (INDEPENDENT_AMBULATORY_CARE_PROVIDER_SITE_OTHER): Payer: BC Managed Care – PPO

## 2013-12-20 DIAGNOSIS — E559 Vitamin D deficiency, unspecified: Secondary | ICD-10-CM

## 2013-12-21 LAB — VITAMIN D 25 HYDROXY (VIT D DEFICIENCY, FRACTURES): VIT D 25 HYDROXY: 34 ng/mL (ref 30–89)

## 2013-12-22 ENCOUNTER — Encounter: Payer: Self-pay | Admitting: Family Medicine

## 2013-12-22 ENCOUNTER — Ambulatory Visit (INDEPENDENT_AMBULATORY_CARE_PROVIDER_SITE_OTHER)
Admission: RE | Admit: 2013-12-22 | Discharge: 2013-12-22 | Disposition: A | Discharge status: Home / Self Care | Payer: BC Managed Care – PPO | Source: Ambulatory Visit | Attending: Family Medicine | Admitting: Family Medicine

## 2013-12-22 ENCOUNTER — Ambulatory Visit (INDEPENDENT_AMBULATORY_CARE_PROVIDER_SITE_OTHER): Payer: BC Managed Care – PPO | Admitting: Family Medicine

## 2013-12-22 VITALS — BP 136/80 | HR 59 | Temp 97.4°F | Ht 65.0 in | Wt 252.2 lb

## 2013-12-22 DIAGNOSIS — R700 Elevated erythrocyte sedimentation rate: Secondary | ICD-10-CM

## 2013-12-22 DIAGNOSIS — M549 Dorsalgia, unspecified: Secondary | ICD-10-CM

## 2013-12-22 DIAGNOSIS — M2550 Pain in unspecified joint: Secondary | ICD-10-CM

## 2013-12-22 MED ORDER — TRAMADOL HCL 50 MG PO TABS: 50.0000 mg | ORAL_TABLET | Freq: Three times a day (TID) | ORAL | Status: DC | PRN

## 2013-12-22 MED ORDER — TIZANIDINE HCL 4 MG PO CAPS: 4.0000 mg | ORAL_CAPSULE | Freq: Three times a day (TID) | ORAL | Status: DC | PRN

## 2013-12-22 NOTE — Patient Instructions (Signed)
Good to see you. We are getting a back xray today.  I will call you with those results.  Start Zanaflex as needed for back spasms.  Continue tylenol and we are adding Tramadol as needed for pain.  You will hear from [place] about a rheumatologist.

## 2013-12-22 NOTE — Assessment & Plan Note (Signed)
Lumbar xray today. I suspect OA.  See below.

## 2013-12-22 NOTE — Progress Notes (Signed)
Pre-visit discussion using our clinic review tool. No additional management support is needed unless otherwise documented below in the visit note.  

## 2013-12-22 NOTE — Assessment & Plan Note (Signed)
Refer to rheum given elevated SED rate. I suspect this is OA, worsened by her weight. Will add Tramadol, use stronger muscle relaxant- xanaflex.

## 2013-12-22 NOTE — Progress Notes (Signed)
Pleasant 34 female with past medical history of coronary artery disease status post coronary bypass graf, Afib (followed by Dr. Jens Som), DM, CRF, morbid obesity here for "joint pain all over."  Taking flexeril for back pain along with Tylenol and it's not helping much.  Did have elevated SED rate in 08/2013.   Has pain in "all joints"- mainly knees, hands and back.  Now low back pain is worse, radiates to hips and thigh at times. Refused PT in past.   Current Outpatient Prescriptions  Medication Sig Dispense Refill  . acetaminophen (TYLENOL) 650 MG suppository Place 1,300 mg rectally 2 (two) times daily.      Marland Kitchen albuterol (PROVENTIL HFA;VENTOLIN HFA) 108 (90 BASE) MCG/ACT inhaler Inhale 2 puffs into the lungs every 6 (six) hours as needed for wheezing.  1 Inhaler  3  . aspirin EC 325 MG tablet Take 325 mg by mouth daily.       . cyclobenzaprine (FLEXERIL) 5 MG tablet TAKE ONE TO TWO TABLETS BY MOUTH THREE TIMES DAILY AS NEEDED FOR  MUSCLE  SPASMS  30 tablet  0  . enalapril (VASOTEC) 20 MG tablet TAKE ONE TABLET BY MOUTH ONCE DAILY  90 tablet  0  . furosemide (LASIX) 40 MG tablet Take 1 tablet (40 mg total) by mouth daily.  30 tablet  10  . glipiZIDE (GLUCOTROL) 5 MG tablet TAKE TWO TABLETS BY MOUTH TWICE DAILY BEFORE A MEAL  120 tablet  5  . insulin NPH (HUMULIN N,NOVOLIN N) 100 UNIT/ML injection Inject 82 Units into the skin 2 (two) times daily.  5 vial  3  . lovastatin (MEVACOR) 20 MG tablet TAKE ONE TABLET BY MOUTH AT BEDTIME  90 tablet  0  . metoprolol (LOPRESSOR) 50 MG tablet TAKE ONE & ONE-HALF TABLETS BY MOUTH TWICE DAILY  270 tablet  0  . Multiple Vitamin (MULTIVITAMIN) capsule Take 1 capsule by mouth daily.        . nitroGLYCERIN (NITROSTAT) 0.4 MG SL tablet Place 1 tablet (0.4 mg total) under the tongue every 5 (five) minutes as needed for chest pain.  25 tablet  3  . Vitamin D, Ergocalciferol, (DRISDOL) 50000 UNITS CAPS capsule Take 1 capsule (50,000 Units total) by mouth every 7  (seven) days.  6 capsule  0  . tiZANidine (ZANAFLEX) 4 MG capsule Take 1 capsule (4 mg total) by mouth 3 (three) times daily as needed for muscle spasms.  30 capsule  1  . traMADol (ULTRAM) 50 MG tablet Take 1 tablet (50 mg total) by mouth every 8 (eight) hours as needed.  30 tablet  0   No current facility-administered medications for this visit.     Past Medical History  Diagnosis Date  . CAD (coronary artery disease)   . Atrial fibrillation   . CHF (congestive heart failure)   . Hyperlipidemia   . HTN (hypertension)   . MI (myocardial infarction)   . DM (diabetes mellitus)   . COPD (chronic obstructive pulmonary disease)   . Vertigo   . Anxiety   . Obesity   . Hx of CABG     Past Surgical History  Procedure Laterality Date  . Coronary artery bypass graft  12/2001  . S/p dilatation and curettage      History   Social History  . Marital Status: Married    Spouse Name: N/A    Number of Children: N/A  . Years of Education: N/A   Occupational History  . Not on file.  Social History Main Topics  . Smoking status: Former Smoker    Types: Cigarettes    Quit date: 09/01/1991  . Smokeless tobacco: Not on file     Comment: has a 30 pack year history, quit in late 1990s  . Alcohol Use: No  . Drug Use: No  . Sexual Activity: Not on file   Other Topics Concern  . Not on file   Social History Narrative  . No narrative on file    ROS:  See HPI No urinary or fecal incontinence No LE or UE weakness  Physical Exam: BP 136/80  Pulse 59  Temp(Src) 97.4 F (36.3 C) (Oral)  Ht 5\' 5"  (1.651 m)  Wt 252 lb 4 oz (114.42 kg)  BMI 41.98 kg/m2   General:  Obese, Well-developed,well-nourished,in no acute distress; alert,appropriate and cooperative throughout examination Head:  normocephalic and atraumatic.   Msk:  No deformity or scoliosis noted of thoracic or lumbar spine.   Extremities:  No clubbing, cyanosis, edema, or deformity noted with normal full range of  motion of all joints.   SLR pos right Neurologic:  alert & oriented X3 and gait normal.   Cervical Nodes:  No lymphadenopathy noted Axillary Nodes:  No palpable lymphadenopathy Psych:  Cognition and judgment appear intact. Alert and cooperative with normal attention span and concentration. No apparent delusions, illusions, hallucinations   Assessment and Plan:

## 2014-01-17 ENCOUNTER — Encounter: Payer: BC Managed Care – PPO | Admitting: Internal Medicine

## 2014-02-07 ENCOUNTER — Other Ambulatory Visit: Payer: Self-pay | Admitting: Cardiology

## 2014-02-28 ENCOUNTER — Other Ambulatory Visit (HOSPITAL_COMMUNITY): Payer: Self-pay | Admitting: *Deleted

## 2014-02-28 DIAGNOSIS — I6529 Occlusion and stenosis of unspecified carotid artery: Secondary | ICD-10-CM

## 2014-03-09 ENCOUNTER — Ambulatory Visit (INDEPENDENT_AMBULATORY_CARE_PROVIDER_SITE_OTHER): Payer: BC Managed Care – PPO | Admitting: Internal Medicine

## 2014-03-09 ENCOUNTER — Encounter (HOSPITAL_COMMUNITY): Payer: BC Managed Care – PPO

## 2014-03-09 ENCOUNTER — Encounter: Payer: Self-pay | Admitting: Internal Medicine

## 2014-03-09 VITALS — BP 118/72 | HR 66 | Temp 97.4°F | Wt 248.0 lb

## 2014-03-09 DIAGNOSIS — R300 Dysuria: Secondary | ICD-10-CM

## 2014-03-09 DIAGNOSIS — R309 Painful micturition, unspecified: Secondary | ICD-10-CM

## 2014-03-09 LAB — POCT URINALYSIS DIPSTICK
Glucose, UA: NEGATIVE
Spec Grav, UA: 1.015

## 2014-03-09 NOTE — Progress Notes (Signed)
Pre visit review using our clinic review tool, if applicable. No additional management support is needed unless otherwise documented below in the visit note. 

## 2014-03-09 NOTE — Patient Instructions (Signed)

## 2014-03-09 NOTE — Progress Notes (Signed)
HPI  Pt presents to the clinic today with c/o dysuria. This started yesterday. She also reports she is voiding small amounts. It hurts in her urethra after she stops urinating. She denies fever, chills or body aches. She has had a UTI in the past. She has also had kidney stones in the past.   Review of Systems  Past Medical History  Diagnosis Date  . CAD (coronary artery disease)   . Atrial fibrillation   . CHF (congestive heart failure)   . Hyperlipidemia   . HTN (hypertension)   . MI (myocardial infarction)   . DM (diabetes mellitus)   . COPD (chronic obstructive pulmonary disease)   . Vertigo   . Anxiety   . Obesity   . Hx of CABG     Family History  Problem Relation Age of Onset  . Hip fracture Mother     died at 58 following hip fx  . Diabetes Sister     History   Social History  . Marital Status: Married    Spouse Name: N/A    Number of Children: N/A  . Years of Education: N/A   Occupational History  . Not on file.   Social History Main Topics  . Smoking status: Former Smoker    Types: Cigarettes    Quit date: 09/01/1991  . Smokeless tobacco: Not on file     Comment: has a 30 pack year history, quit in late 1990s  . Alcohol Use: No  . Drug Use: No  . Sexual Activity: Not on file   Other Topics Concern  . Not on file   Social History Narrative  . No narrative on file    Allergies  Allergen Reactions  . Colcrys [Colchicine] Other (See Comments)    Dizzy and fill like pass out  . Metformin And Related Swelling  . Pravastatin Swelling  . Sulfa Antibiotics Swelling  . Zocor [Simvastatin] Hives and Swelling    Constitutional: Denies fever, malaise, fatigue, headache or abrupt weight changes.   GU: Pt reports urgency, frequency and pain with urination. Denies burning sensation, blood in urine, odor or discharge. Skin: Denies redness, rashes, lesions or ulcercations.   No other specific complaints in a complete review of systems (except as listed  in HPI above).    Objective:   Physical Exam  Pulse 66  Temp(Src) 97.4 F (36.3 C) (Oral)  Wt 248 lb (112.492 kg)  SpO2 94% Wt Readings from Last 3 Encounters:  03/09/14 248 lb (112.492 kg)  12/22/13 252 lb 4 oz (114.42 kg)  09/06/13 253 lb 8 oz (114.987 kg)    General: Appears her stated age, well developed, well nourished in NAD. Cardiovascular: Normal rate and rhythm. S1,S2 noted.  No murmur, rubs or gallops noted. No JVD or BLE edema. No carotid bruits noted. Pulmonary/Chest: Normal effort and positive vesicular breath sounds. No respiratory distress. No wheezes, rales or ronchi noted.  Abdomen: Soft and nontender. Normal bowel sounds, no bruits noted. No distention or masses noted. Liver, spleen and kidneys non palpable. Tender to palpation over the bladder area. No CVA tenderness.      Assessment & Plan:   Urgency, Frequency, Dysuria secondary to Cystitis:  Urinalysis: mod blood, otherwise negative OK to take AZO OTC Drink plenty of fluids  RTC as needed or if symptoms persist.

## 2014-03-17 ENCOUNTER — Ambulatory Visit (HOSPITAL_COMMUNITY): Payer: BC Managed Care – PPO | Attending: Cardiovascular Disease | Admitting: Cardiology

## 2014-03-17 ENCOUNTER — Encounter: Payer: Self-pay | Admitting: Cardiovascular Disease

## 2014-03-17 DIAGNOSIS — I679 Cerebrovascular disease, unspecified: Secondary | ICD-10-CM

## 2014-03-17 DIAGNOSIS — I2510 Atherosclerotic heart disease of native coronary artery without angina pectoris: Secondary | ICD-10-CM | POA: Insufficient documentation

## 2014-03-17 DIAGNOSIS — Z87891 Personal history of nicotine dependence: Secondary | ICD-10-CM | POA: Insufficient documentation

## 2014-03-17 DIAGNOSIS — E119 Type 2 diabetes mellitus without complications: Secondary | ICD-10-CM | POA: Insufficient documentation

## 2014-03-17 DIAGNOSIS — R42 Dizziness and giddiness: Secondary | ICD-10-CM

## 2014-03-17 DIAGNOSIS — J449 Chronic obstructive pulmonary disease, unspecified: Secondary | ICD-10-CM | POA: Insufficient documentation

## 2014-03-17 DIAGNOSIS — I658 Occlusion and stenosis of other precerebral arteries: Secondary | ICD-10-CM | POA: Insufficient documentation

## 2014-03-17 DIAGNOSIS — I6529 Occlusion and stenosis of unspecified carotid artery: Secondary | ICD-10-CM | POA: Insufficient documentation

## 2014-03-17 DIAGNOSIS — J4489 Other specified chronic obstructive pulmonary disease: Secondary | ICD-10-CM | POA: Insufficient documentation

## 2014-03-17 DIAGNOSIS — E785 Hyperlipidemia, unspecified: Secondary | ICD-10-CM | POA: Insufficient documentation

## 2014-03-17 DIAGNOSIS — Z951 Presence of aortocoronary bypass graft: Secondary | ICD-10-CM | POA: Insufficient documentation

## 2014-03-17 DIAGNOSIS — I10 Essential (primary) hypertension: Secondary | ICD-10-CM | POA: Insufficient documentation

## 2014-03-17 NOTE — Progress Notes (Signed)
Carotid duplex performed 

## 2014-03-21 ENCOUNTER — Telehealth: Payer: Self-pay

## 2014-03-21 MED ORDER — ENALAPRIL MALEATE 10 MG PO TABS: ORAL_TABLET | ORAL | Status: DC

## 2014-03-21 NOTE — Telephone Encounter (Signed)
Yes it looks like Dr. Jens Som is managing her blood pressure medication but I would decrease her enalapril to 10 mg daily and have her follow up in 1 week.  I sent in an rx to her pharmacy for 10 mg dose of enalapril but she should also touch base with Dr. Jens Som.

## 2014-03-21 NOTE — Telephone Encounter (Signed)
Pt saw rheumatologist 03/20/14 and BP was 108/60; pt has dizziness and h/a. No CP or difficulty breathing. Rheumatologist also checked u/a and pt was given amoxicillin for UTI. Pt had carotid US 03/17/14 and BP 92/60 and 103/60. Pt wants to know if need stop BP med? [facility] [address]. Pt request cb.

## 2014-03-21 NOTE — Telephone Encounter (Signed)
Spoke to pt and informed her Rx has been sent to pharmacy. Advised per Dr Dayton Martes, and pt verbally expressed understanding.

## 2014-03-23 ENCOUNTER — Telehealth: Payer: Self-pay | Admitting: Cardiology

## 2014-03-23 NOTE — Telephone Encounter (Signed)
4/3 carotid showed systolic BP 96 and 102, 4/6 MD visit 96/60, 4/9 95/78, pulse in the 70s.  The pt also complains of dizziness. I reviewed the pt's cardiac medications and her medication list has Enalapril 10mg  daily.  In speaking with the pt she is currently taking Enalapril 20mg  daily.  I instructed the pt to decrease her Enalapril to 10mg  daily and check her BP daily.  The pt will call our office in 1 week to give BP readings and let us know how she is feeling after this medication change.  The pt was instructed to call the office if she has any other questions or concerns.  Pt agreed with plan.

## 2014-03-23 NOTE — Telephone Encounter (Signed)
New Message  Pt called states that her BP is low. She is requesting a call back to discuss altering her BP medications  She also says that she has been fairly dizzy for the last 4-5 days. Requests a call back to discuss.

## 2014-03-25 ENCOUNTER — Encounter (HOSPITAL_COMMUNITY): Payer: Self-pay | Admitting: Emergency Medicine

## 2014-03-25 ENCOUNTER — Emergency Department (HOSPITAL_COMMUNITY): Payer: BC Managed Care – PPO

## 2014-03-25 ENCOUNTER — Inpatient Hospital Stay (HOSPITAL_COMMUNITY)
Admission: EM | Admit: 2014-03-25 | Discharge: 2014-03-27 | DRG: 683 | Disposition: A | Discharge status: Home / Self Care | Payer: BC Managed Care – PPO | Attending: Internal Medicine | Admitting: Internal Medicine

## 2014-03-25 DIAGNOSIS — I252 Old myocardial infarction: Secondary | ICD-10-CM

## 2014-03-25 DIAGNOSIS — N189 Chronic kidney disease, unspecified: Secondary | ICD-10-CM

## 2014-03-25 DIAGNOSIS — R300 Dysuria: Secondary | ICD-10-CM

## 2014-03-25 DIAGNOSIS — I509 Heart failure, unspecified: Secondary | ICD-10-CM

## 2014-03-25 DIAGNOSIS — Z87891 Personal history of nicotine dependence: Secondary | ICD-10-CM

## 2014-03-25 DIAGNOSIS — I2510 Atherosclerotic heart disease of native coronary artery without angina pectoris: Secondary | ICD-10-CM | POA: Diagnosis present

## 2014-03-25 DIAGNOSIS — J4489 Other specified chronic obstructive pulmonary disease: Secondary | ICD-10-CM

## 2014-03-25 DIAGNOSIS — N179 Acute kidney failure, unspecified: Principal | ICD-10-CM | POA: Diagnosis present

## 2014-03-25 DIAGNOSIS — M109 Gout, unspecified: Secondary | ICD-10-CM | POA: Diagnosis present

## 2014-03-25 DIAGNOSIS — Z833 Family history of diabetes mellitus: Secondary | ICD-10-CM

## 2014-03-25 DIAGNOSIS — N183 Chronic kidney disease, stage 3 unspecified: Secondary | ICD-10-CM | POA: Diagnosis present

## 2014-03-25 DIAGNOSIS — R21 Rash and other nonspecific skin eruption: Secondary | ICD-10-CM

## 2014-03-25 DIAGNOSIS — I10 Essential (primary) hypertension: Secondary | ICD-10-CM

## 2014-03-25 DIAGNOSIS — I129 Hypertensive chronic kidney disease with stage 1 through stage 4 chronic kidney disease, or unspecified chronic kidney disease: Secondary | ICD-10-CM | POA: Diagnosis present

## 2014-03-25 DIAGNOSIS — E669 Obesity, unspecified: Secondary | ICD-10-CM

## 2014-03-25 DIAGNOSIS — Z7982 Long term (current) use of aspirin: Secondary | ICD-10-CM

## 2014-03-25 DIAGNOSIS — F411 Generalized anxiety disorder: Secondary | ICD-10-CM

## 2014-03-25 DIAGNOSIS — Z794 Long term (current) use of insulin: Secondary | ICD-10-CM

## 2014-03-25 DIAGNOSIS — J449 Chronic obstructive pulmonary disease, unspecified: Secondary | ICD-10-CM

## 2014-03-25 DIAGNOSIS — I4891 Unspecified atrial fibrillation: Secondary | ICD-10-CM | POA: Diagnosis present

## 2014-03-25 DIAGNOSIS — R42 Dizziness and giddiness: Secondary | ICD-10-CM

## 2014-03-25 DIAGNOSIS — E785 Hyperlipidemia, unspecified: Secondary | ICD-10-CM

## 2014-03-25 DIAGNOSIS — Z951 Presence of aortocoronary bypass graft: Secondary | ICD-10-CM

## 2014-03-25 DIAGNOSIS — E871 Hypo-osmolality and hyponatremia: Secondary | ICD-10-CM | POA: Diagnosis present

## 2014-03-25 DIAGNOSIS — E875 Hyperkalemia: Secondary | ICD-10-CM | POA: Diagnosis present

## 2014-03-25 DIAGNOSIS — N289 Disorder of kidney and ureter, unspecified: Secondary | ICD-10-CM

## 2014-03-25 DIAGNOSIS — E119 Type 2 diabetes mellitus without complications: Secondary | ICD-10-CM

## 2014-03-25 DIAGNOSIS — I5042 Chronic combined systolic (congestive) and diastolic (congestive) heart failure: Secondary | ICD-10-CM | POA: Diagnosis present

## 2014-03-25 HISTORY — DX: Gout, unspecified: M10.9

## 2014-03-25 LAB — GLUCOSE, CAPILLARY
Glucose-Capillary: 259 mg/dL — ABNORMAL HIGH (ref 70–99)
Glucose-Capillary: 233 mg/dL — ABNORMAL HIGH (ref 70–99)

## 2014-03-25 LAB — COMPREHENSIVE METABOLIC PANEL
ALK PHOS: 104 U/L (ref 39–117)
ALT: 17 U/L (ref 0–35)
AST: 20 U/L (ref 0–37)
Albumin: 3 g/dL — ABNORMAL LOW (ref 3.5–5.2)
BUN: 57 mg/dL — ABNORMAL HIGH (ref 6–23)
CHLORIDE: 95 meq/L — AB (ref 96–112)
CO2: 21 mEq/L (ref 19–32)
Calcium: 9 mg/dL (ref 8.4–10.5)
Creatinine, Ser: 1.93 mg/dL — ABNORMAL HIGH (ref 0.50–1.10)
GFR, EST AFRICAN AMERICAN: 31 mL/min — AB (ref >90)
GFR, EST NON AFRICAN AMERICAN: 26 mL/min — AB (ref >90)
GLUCOSE: 335 mg/dL — AB (ref 70–99)
POTASSIUM: 6.3 meq/L — AB (ref 3.7–5.3)
SODIUM: 132 meq/L — AB (ref 137–147)
Total Bilirubin: 0.4 mg/dL (ref 0.3–1.2)
Total Protein: 6.5 g/dL (ref 6.0–8.3)

## 2014-03-25 LAB — CBC WITH DIFFERENTIAL/PLATELET
BASOS ABS: 0.1 10*3/uL (ref 0.0–0.1)
Basophils Relative: 1 % (ref 0–1)
Eosinophils Absolute: 0.1 10*3/uL (ref 0.0–0.7)
Eosinophils Relative: 2 % (ref 0–5)
HCT: 33.9 % — ABNORMAL LOW (ref 36.0–46.0)
Hemoglobin: 11.4 g/dL — ABNORMAL LOW (ref 12.0–15.0)
LYMPHS ABS: 1.9 10*3/uL (ref 0.7–4.0)
LYMPHS PCT: 31 % (ref 12–46)
MCH: 30 pg (ref 26.0–34.0)
MCHC: 33.6 g/dL (ref 30.0–36.0)
MCV: 89.2 fL (ref 78.0–100.0)
Monocytes Absolute: 0.4 10*3/uL (ref 0.1–1.0)
Monocytes Relative: 7 % (ref 3–12)
NEUTROS ABS: 3.6 10*3/uL (ref 1.7–7.7)
Neutrophils Relative %: 59 % (ref 43–77)
Platelets: 169 10*3/uL (ref 150–400)
RBC: 3.8 MIL/uL — AB (ref 3.87–5.11)
RDW: 17.1 % — ABNORMAL HIGH (ref 11.5–15.5)
WBC: 6.1 10*3/uL (ref 4.0–10.5)

## 2014-03-25 LAB — URINALYSIS, ROUTINE W REFLEX MICROSCOPIC
Bilirubin Urine: NEGATIVE
Glucose, UA: 100 mg/dL — AB
Hgb urine dipstick: NEGATIVE
KETONES UR: NEGATIVE mg/dL
NITRITE: NEGATIVE
Protein, ur: NEGATIVE mg/dL
Specific Gravity, Urine: 1.017 (ref 1.005–1.030)
UROBILINOGEN UA: 0.2 mg/dL (ref 0.0–1.0)
pH: 5 (ref 5.0–8.0)

## 2014-03-25 LAB — TSH: TSH: 1.74 u[IU]/mL (ref 0.350–4.500)

## 2014-03-25 LAB — URIC ACID: Uric Acid, Serum: 9.1 mg/dL — ABNORMAL HIGH (ref 2.4–7.0)

## 2014-03-25 LAB — TROPONIN I

## 2014-03-25 LAB — URINE MICROSCOPIC-ADD ON

## 2014-03-25 LAB — PRO B NATRIURETIC PEPTIDE: PRO B NATRI PEPTIDE: 1683 pg/mL — AB (ref 0–125)

## 2014-03-25 MED ORDER — SODIUM POLYSTYRENE SULFONATE 15 GM/60ML PO SUSP
30.0000 g | Freq: Once | ORAL | Status: AC
  Administered 2014-03-25: 30 g via ORAL
  Filled 2014-03-25: qty 120

## 2014-03-25 MED ORDER — SODIUM CHLORIDE 0.9 % IV SOLN
INTRAVENOUS | Status: AC
  Administered 2014-03-25: 19:00:00 via INTRAVENOUS

## 2014-03-25 MED ORDER — TRAMADOL HCL 50 MG PO TABS
50.0000 mg | ORAL_TABLET | Freq: Three times a day (TID) | ORAL | Status: DC | PRN
  Administered 2014-03-25 – 2014-03-26 (×2): 50 mg via ORAL
  Filled 2014-03-25 (×2): qty 1

## 2014-03-25 MED ORDER — FAMOTIDINE 20 MG PO TABS
20.0000 mg | ORAL_TABLET | Freq: Once | ORAL | Status: AC
  Administered 2014-03-25: 20 mg via ORAL
  Filled 2014-03-25: qty 1

## 2014-03-25 MED ORDER — SODIUM CHLORIDE 0.9 % IV SOLN
1.0000 g | Freq: Once | INTRAVENOUS | Status: AC
  Administered 2014-03-25: 1 g via INTRAVENOUS
  Filled 2014-03-25: qty 10

## 2014-03-25 MED ORDER — SIMVASTATIN 10 MG PO TABS: 10.0000 mg | ORAL_TABLET | Freq: Every day | ORAL | Status: DC

## 2014-03-25 MED ORDER — ALBUTEROL SULFATE (2.5 MG/3ML) 0.083% IN NEBU: 3.0000 mL | INHALATION_SOLUTION | Freq: Four times a day (QID) | RESPIRATORY_TRACT | Status: DC | PRN

## 2014-03-25 MED ORDER — DOCUSATE SODIUM 100 MG PO CAPS
100.0000 mg | ORAL_CAPSULE | Freq: Two times a day (BID) | ORAL | Status: DC
  Administered 2014-03-25 – 2014-03-27 (×4): 100 mg via ORAL
  Filled 2014-03-25 (×5): qty 1

## 2014-03-25 MED ORDER — SODIUM CHLORIDE 0.9 % IJ SOLN
3.0000 mL | Freq: Two times a day (BID) | INTRAMUSCULAR | Status: DC
  Administered 2014-03-25 – 2014-03-27 (×3): 3 mL via INTRAVENOUS

## 2014-03-25 MED ORDER — MULTIVITAMINS PO CAPS: 1.0000 | ORAL_CAPSULE | Freq: Every day | ORAL | Status: DC

## 2014-03-25 MED ORDER — ASPIRIN EC 325 MG PO TBEC
325.0000 mg | DELAYED_RELEASE_TABLET | Freq: Every day | ORAL | Status: DC
  Administered 2014-03-26 – 2014-03-27 (×2): 325 mg via ORAL
  Filled 2014-03-25 (×2): qty 1

## 2014-03-25 MED ORDER — ADULT MULTIVITAMIN W/MINERALS CH
1.0000 | ORAL_TABLET | Freq: Every day | ORAL | Status: DC
  Administered 2014-03-26 – 2014-03-27 (×2): 1 via ORAL
  Filled 2014-03-25 (×2): qty 1

## 2014-03-25 MED ORDER — MORPHINE SULFATE 2 MG/ML IJ SOLN: 2.0000 mg | INTRAMUSCULAR | Status: DC | PRN

## 2014-03-25 MED ORDER — INSULIN ASPART 100 UNIT/ML ~~LOC~~ SOLN: 4.0000 [IU] | Freq: Once | SUBCUTANEOUS | Status: DC

## 2014-03-25 MED ORDER — ACETAMINOPHEN 500 MG PO TABS: 500.0000 mg | ORAL_TABLET | Freq: Four times a day (QID) | ORAL | Status: DC | PRN

## 2014-03-25 MED ORDER — DEXTROSE 50 % IV SOLN
1.0000 | Freq: Once | INTRAVENOUS | Status: AC
  Administered 2014-03-25: 50 mL via INTRAVENOUS
  Filled 2014-03-25: qty 50

## 2014-03-25 MED ORDER — INSULIN ASPART 100 UNIT/ML ~~LOC~~ SOLN: 0.0000 [IU] | Freq: Three times a day (TID) | SUBCUTANEOUS | Status: DC

## 2014-03-25 MED ORDER — INSULIN ASPART 100 UNIT/ML IV SOLN
10.0000 [IU] | Freq: Once | INTRAVENOUS | Status: AC
  Administered 2014-03-25: 10 [IU] via INTRAVENOUS
  Filled 2014-03-25: qty 0.1

## 2014-03-25 MED ORDER — LOVASTATIN 20 MG PO TABS
20.0000 mg | ORAL_TABLET | Freq: Every day | ORAL | Status: DC
  Administered 2014-03-25 – 2014-03-26 (×2): 20 mg via ORAL
  Filled 2014-03-25 (×3): qty 1

## 2014-03-25 MED ORDER — METOPROLOL TARTRATE 50 MG PO TABS
50.0000 mg | ORAL_TABLET | Freq: Two times a day (BID) | ORAL | Status: DC
  Administered 2014-03-25 – 2014-03-27 (×4): 50 mg via ORAL
  Filled 2014-03-25 (×5): qty 1

## 2014-03-25 MED ORDER — SODIUM BICARBONATE 8.4 % IV SOLN
50.0000 meq | Freq: Once | INTRAVENOUS | Status: AC
  Administered 2014-03-25: 50 meq via INTRAVENOUS
  Filled 2014-03-25: qty 50

## 2014-03-25 MED ORDER — VITAMIN D3 25 MCG (1000 UNIT) PO TABS
1000.0000 [IU] | ORAL_TABLET | Freq: Every day | ORAL | Status: DC
  Administered 2014-03-26 – 2014-03-27 (×2): 1000 [IU] via ORAL
  Filled 2014-03-25 (×2): qty 1

## 2014-03-25 MED ORDER — INSULIN NPH (HUMAN) (ISOPHANE) 100 UNIT/ML ~~LOC~~ SUSP
82.0000 [IU] | Freq: Two times a day (BID) | SUBCUTANEOUS | Status: DC
  Administered 2014-03-25 – 2014-03-27 (×4): 82 [IU] via SUBCUTANEOUS
  Filled 2014-03-25: qty 10

## 2014-03-25 NOTE — ED Notes (Signed)
Pt c/o red rash and swelling to face for past couple of days. Pt reports that she recently started amoxicillin on Monday or Tuesday.

## 2014-03-25 NOTE — Progress Notes (Signed)
CRITICAL VALUE ALERT  Critical value received:  bg [id number]  Date of notification: 03/25/14  Time of notification:  2100  Critical value read back:  Nurse who received alert: Angie  MD notified (1st page):  Burnadette Peter  Time of first page:  2120  MD notified (2nd page):  Time of second page:  Responding MD:  Burnadette Peter  Time MD responded:  2130

## 2014-03-25 NOTE — ED Provider Notes (Signed)
CSN: [id number]     Arrival date & time 03/25/14  1223 History   First MD Initiated Contact with Patient 03/25/14 1237     Chief Complaint  Patient presents with  . Rash    face  . Facial Swelling    face     (Consider location/radiation/quality/duration/timing/severity/associated sxs/prior Treatment) HPI Patient presents with several complaints. She states her blood pressure has been low. She had a Doppler ultrasound done showed she had some blockages in her neck. She states she was having dizziness. They have changed her blood pressure medication doses to lower them. She states sometimes she feels like she's going to pass out. She also states when she eats her eyes "feel funny". She denies having blurred vision. She states this morning her CBG was high at 184. She also states that she has been having some redness of her face, she has been checked for lupus in the past and her tests were negative. She thinks she started getting increased redness about 5 days ago. She was started on amoxicillin 4 days ago for a UT "that was the worst the doctor had seen in a long time". She takes 500 mg twice a day. She states that since she has been on amoxicillin the redness seems to beginning worse. She states it was itching when it first started. She has itching that is controlled now with Benadryl that she takes twice a day. She also states her face was swollen until yesterday. She states her throat feels like it swollen at times and today she's had some mild shortness of breath. She denies using any new makeup or lotions or chemicals on her face. Her husband is concerned because she had bypass surgery done over 10 years ago" when she has heart problems she doesn't have typical symptoms". They are also concerned because they were told by her gout doctor that her kidney function had gotten worse. Her prednisone was stopped because it was making her blood sugar go high.  PCP Dr Dayton Martes Cardiology Dr  Jens Som Rheumatology Dr Dierdre Forth and Dr Maple Hudson  Past Medical History  Diagnosis Date  . CAD (coronary artery disease)   . Atrial fibrillation   . CHF (congestive heart failure)   . Hyperlipidemia   . HTN (hypertension)   . MI (myocardial infarction)   . DM (diabetes mellitus)   . COPD (chronic obstructive pulmonary disease)   . Vertigo   . Anxiety   . Obesity   . Hx of CABG   . Gout    Past Surgical History  Procedure Laterality Date  . Coronary artery bypass graft  12/2001  . S/p dilatation and curettage     Family History  Problem Relation Age of Onset  . Hip fracture Mother     died at 28 following hip fx  . Diabetes Sister    History  Substance Use Topics  . Smoking status: Former Smoker    Types: Cigarettes    Quit date: 09/01/1991  . Smokeless tobacco: Not on file     Comment: has a 30 pack year history, quit in late 1990s  . Alcohol Use: No   Lives at home Lives with spouse  OB History   Grav Para Term Preterm Abortions TAB SAB Ect Mult Living                 Review of Systems  All other systems reviewed and are negative.     Allergies  Colcrys; Metformin and related; Pravastatin;  Sulfa antibiotics; and Zocor  Home Medications   Current Outpatient Rx  Name  Route  Sig  Dispense  Refill  . acetaminophen (TYLENOL) 500 MG tablet   Oral   Take 500 mg by mouth every 6 (six) hours as needed for mild pain or headache.         . albuterol (PROVENTIL HFA;VENTOLIN HFA) 108 (90 BASE) MCG/ACT inhaler   Inhalation   Inhale 2 puffs into the lungs every 6 (six) hours as needed for wheezing.   1 Inhaler   3   . allopurinol (ZYLOPRIM) 100 MG tablet   Oral   Take 100 mg by mouth daily.          Marland Kitchen amoxicillin (AMOXIL) 500 MG capsule   Oral   Take 500 mg by mouth 2 (two) times daily.          Marland Kitchen aspirin EC 325 MG tablet   Oral   Take 325 mg by mouth daily.          . cholecalciferol (VITAMIN D) 1000 UNITS tablet   Oral   Take 1,000 Units by  mouth daily.          . Cholecalciferol (VITAMIN D) 400 UNITS capsule   Oral   Take 400 Units by mouth daily.         . enalapril (VASOTEC) 20 MG tablet   Oral   Take 10 mg by mouth daily.         . furosemide (LASIX) 40 MG tablet   Oral   Take 1 tablet (40 mg total) by mouth daily.   30 tablet   10   . glipiZIDE (GLUCOTROL) 5 MG tablet      TAKE TWO TABLETS BY MOUTH TWICE DAILY BEFORE A MEAL   120 tablet   5   . insulin NPH (HUMULIN N,NOVOLIN N) 100 UNIT/ML injection   Subcutaneous   Inject 82 Units into the skin 2 (two) times daily.   5 vial   3   . lovastatin (MEVACOR) 20 MG tablet      TAKE ONE TABLET BY MOUTH AT BEDTIME         . metoprolol (LOPRESSOR) 50 MG tablet      TAKE ONE & ONE-HALF TABLETS BY MOUTH TWICE DAILY   270 tablet   0     **PATIENT NEEDS APPOINTMENT**   . Multiple Vitamin (MULTIVITAMIN) capsule   Oral   Take 1 capsule by mouth daily.           . traMADol (ULTRAM) 50 MG tablet   Oral   Take 1 tablet (50 mg total) by mouth every 8 (eight) hours as needed.   30 tablet   0    BP 119/54  Pulse 74  Temp(Src) 97.8 F (36.6 C) (Oral)  Resp 18  Ht 5' 6.5" (1.689 m)  Wt 248 lb (112.492 kg)  BMI 39.43 kg/m2  SpO2 97%  Vital signs normal   Orthostatic vital signs were not positive. Her blood pressure remained around 124/55-119/54, her heart rate remained 72-74  Physical Exam  Nursing note and vitals reviewed. Constitutional: She is oriented to person, place, and time. She appears well-developed and well-nourished.  Non-toxic appearance. She does not appear ill. No distress.  Obese   HENT:  Head: Normocephalic and atraumatic.  Right Ear: External ear normal.  Left Ear: External ear normal.  Nose: Nose normal. No mucosal edema or rhinorrhea.  Mouth/Throat: Oropharynx is clear and moist and  mucous membranes are normal. No dental abscesses or uvula swelling.  Eyes: Conjunctivae and EOM are normal. Pupils are equal, round, and  reactive to light.  Neck: Normal range of motion and full passive range of motion without pain. Neck supple.  Cardiovascular: Normal rate, regular rhythm and normal heart sounds.  Exam reveals no gallop and no friction rub.   No murmur heard. Pulmonary/Chest: Effort normal and breath sounds normal. No respiratory distress. She has no wheezes. She has no rhonchi. She has no rales. She exhibits no tenderness and no crepitus.  Abdominal: Soft. Normal appearance and bowel sounds are normal. She exhibits no distension. There is no tenderness. There is no rebound and no guarding.  Musculoskeletal: Normal range of motion. She exhibits no edema and no tenderness.  Moves all extremities well.   Neurological: She is alert and oriented to person, place, and time. She has normal strength. No cranial nerve deficit.  Skin: Skin is warm, dry and intact. No rash noted. No erythema. No pallor.  Patient has diffuse erythema of her face some sparing around her mouth. There are some small lesions at the edges. There are no open wounds. Please see photo  Psychiatric: She has a normal mood and affect. Her speech is normal and behavior is normal. Her mood appears not anxious.       ED Course  Procedures (including critical care time)  Medications  dextrose 50 % solution 50 mL (not administered)  insulin aspart (novoLOG) injection 10 Units (not administered)  sodium bicarbonate injection 50 mEq (not administered)  sodium polystyrene (KAYEXALATE) 15 GM/60ML suspension 30 g (not administered)  calcium chloride 1 g in sodium chloride 0.9 % 100 mL IVPB (not administered)  famotidine (PEPCID) tablet 20 mg (20 mg Oral Given 03/25/14 1338)   Pt was given oral pepcid for her rash, steroids were not given as she was just taken off of them for hyperglycemia.   IV inserted after reviewing her labs, the lab reports the sample was not hemolyzed. Patient will need to be treated for hyperkalemia. EKG ordered.   Daughter has  a picture of her mother states from 2 nights ago and it was noted her facial erythema was much more intense than today however it is in the same pattern as her redness is today.  Pt treated for hyperkalemia with IV calcium, IV bicarb, IV insulin and D50 and given oral kayexalate.   Patient presents with weakness and dizziness and a lot of nonspecific complaints. She has a rash on her face which she has had before but she is beginning more intensely red. In the course of her ED evaluation patient was found to be hyperkalemic. Patient will need admission.  15:36 Dr Catha Gosselin, admit to tele, team 10 inpatient.   Labs Review Results for orders placed during the hospital encounter of 03/25/14  CBC WITH DIFFERENTIAL      Result Value Ref Range   WBC 6.1  4.0 - 10.5 K/uL   RBC 3.80 (*) 3.87 - 5.11 MIL/uL   Hemoglobin 11.4 (*) 12.0 - 15.0 g/dL   HCT 75.8 (*) 83.2 - 54.9 %   MCV 89.2  78.0 - 100.0 fL   MCH 30.0  26.0 - 34.0 pg   MCHC 33.6  30.0 - 36.0 g/dL   RDW 82.6 (*) 41.5 - 83.0 %   Platelets 169  150 - 400 K/uL   Neutrophils Relative % 59  43 - 77 %   Neutro Abs 3.6  1.7 -  7.7 K/uL   Lymphocytes Relative 31  12 - 46 %   Lymphs Abs 1.9  0.7 - 4.0 K/uL   Monocytes Relative 7  3 - 12 %   Monocytes Absolute 0.4  0.1 - 1.0 K/uL   Eosinophils Relative 2  0 - 5 %   Eosinophils Absolute 0.1  0.0 - 0.7 K/uL   Basophils Relative 1  0 - 1 %   Basophils Absolute 0.1  0.0 - 0.1 K/uL  COMPREHENSIVE METABOLIC PANEL      Result Value Ref Range   Sodium 132 (*) 137 - 147 mEq/L   Potassium 6.3 (*) 3.7 - 5.3 mEq/L   Chloride 95 (*) 96 - 112 mEq/L   CO2 21  19 - 32 mEq/L   Glucose, Bld 335 (*) 70 - 99 mg/dL   BUN 57 (*) 6 - 23 mg/dL   Creatinine, Ser 4.091.93 (*) 0.50 - 1.10 mg/dL   Calcium 9.0  8.4 - 81.110.5 mg/dL   Total Protein 6.5  6.0 - 8.3 g/dL   Albumin 3.0 (*) 3.5 - 5.2 g/dL   AST 20  0 - 37 U/L   ALT 17  0 - 35 U/L   Alkaline Phosphatase 104  39 - 117 U/L   Total Bilirubin 0.4  0.3 - 1.2 mg/dL     GFR calc non Af Amer 26 (*) >90 mL/min   GFR calc Af Amer 31 (*) >90 mL/min  URINALYSIS, ROUTINE W REFLEX MICROSCOPIC      Result Value Ref Range   Color, Urine YELLOW  YELLOW   APPearance CLOUDY (*) CLEAR   Specific Gravity, Urine 1.017  1.005 - 1.030   pH 5.0  5.0 - 8.0   Glucose, UA 100 (*) NEGATIVE mg/dL   Hgb urine dipstick NEGATIVE  NEGATIVE   Bilirubin Urine NEGATIVE  NEGATIVE   Ketones, ur NEGATIVE  NEGATIVE mg/dL   Protein, ur NEGATIVE  NEGATIVE mg/dL   Urobilinogen, UA 0.2  0.0 - 1.0 mg/dL   Nitrite NEGATIVE  NEGATIVE   Leukocytes, UA SMALL (*) NEGATIVE  URIC ACID      Result Value Ref Range   Uric Acid, Serum 9.1 (*) 2.4 - 7.0 mg/dL  PRO B NATRIURETIC PEPTIDE      Result Value Ref Range   Pro B Natriuretic peptide (BNP) 1683.0 (*) 0 - 125 pg/mL  TROPONIN I      Result Value Ref Range   Troponin I <0.30  <0.30 ng/mL  URINE MICROSCOPIC-ADD ON      Result Value Ref Range   Squamous Epithelial / LPF MANY (*) RARE   WBC, UA 3-6  <3 WBC/hpf   Laboratory interpretation all normal except mild worsening of her chronic renal insufficiency, stable anemia, new hyperkalemia, some elevation above her stable BNP in the 300s, hyperglycemia, mild hyponatremia and low chloride  Imaging Review Dg Chest 2 View  03/25/2014   CLINICAL DATA:  Rash discuss that a facial rash and swelling. Prior CABG.  EXAM: CHEST  2 VIEW  COMPARISON:  Chest CT 12/05/2009 in chest radiograph 10/22/2010  FINDINGS: The changes of median sternotomy for CABG. Probable coronary artery stent noted. Heart size appears within normal limits. Pulmonary vascularity is normal. The lungs are well expanded and clear. Negative for airspace disease or pleural effusion. There typical degenerative changes of the thoracic spine. Atherosclerotic calcification of the proximal abdominal aorta and splenic artery noted.  IMPRESSION: Prior CABG and coronary artery stent placement. No acute cardiopulmonary disease.  Electronically  Signed   By: Britta Mccreedy M.D.   On: 03/25/2014 14:19     EKG Interpretation   Date/Time:  Saturday March 25 2014 14:53:56 EDT Ventricular Rate:  67 PR Interval:  152 QRS Duration: 150 QT Interval:  445 QTC Calculation: 470 R Axis:   120 Text Interpretation:  Age not entered, assumed to be  63 years old for  purpose of ECG interpretation Sinus rhythm Nonspecific intraventricular  conduction delay Anterior infarct, old Abnormal T, consider ischemia,  lateral leads No significant change since last tracing 23 Oct 2010  Confirmed by [facility]  MD-I, Reznor Ferrando ([id number]) on 03/25/2014 3:20:16 PM      MDM   Final diagnoses:  Hyperkalemia  Renal insufficiency  Facial rash   Plan admission   Devoria Albe, MD, FACEP   CRITICAL CARE Performed by: Marleni Gallardo L Joachim Carton Total critical care time: 34 min Critical care time was exclusive of separately billable procedures and treating other patients. Critical care was necessary to treat or prevent imminent or life-threatening deterioration. Critical care was time spent personally by me on the following activities: development of treatment plan with patient and/or surrogate as well as nursing, discussions with consultants, evaluation of patient's response to treatment, examination of patient, obtaining history from patient or surrogate, ordering and performing treatments and interventions, ordering and review of laboratory studies, ordering and review of radiographic studies, pulse oximetry and re-evaluation of patient's condition.       Ward Givens, MD 03/25/14 1547

## 2014-03-25 NOTE — ED Notes (Signed)
Pt knows that urine is needed 

## 2014-03-25 NOTE — H&P (Addendum)
[facility] History and Physical  Cassandra Fisher YQM:[id number] DOB: 1951-09-25 DOA: 03/25/2014  Referring physician:  PCP: [name], MD  Specialists:   Chief Complaint: Generalized weakness  HPI: Cassandra Fisher is a 63 y.o. female  With a history of CHF, coronary artery disease, hypertension that presents to the emergency department for generalized weakness. Patient states that she's been feeling unwell and very weak the last few days. She saw her physician on Monday and was found to have a urinary tract infection. Patient also noted that she had a facial rash on Monday. She was started on amoxicillin on Tuesday for urinary tract infection, her rash worsened. Patient broke out with blisters. Her rash began to improve once amoxicillin was stopped and she used Benadryl. Patient continued to feel weak and unwell over the past week. She denies any chest pain or shortness of breath. She denies any fevers or ill contacts or recent travel. Patient also admits to having constipation.  Review of Systems:  Constitutional: Denies fever, chills, diaphoresis, appetite change.  Complains of fatigue and weakness.  HEENT: Denies photophobia, eye pain, redness, hearing loss, ear pain, congestion, sore throat, rhinorrhea, sneezing, mouth sores, trouble swallowing, neck pain, neck stiffness and tinnitus.   Respiratory: Denies SOB, DOE, cough, chest tightness,  and wheezing.   Cardiovascular: Denies chest pain, palpitations. Admits to lower extremity swelling. Gastrointestinal: Admits to constipation. Denies any abdominal pain, nausea, diarrhea. Genitourinary: Denies dysuria, urgency, frequency, hematuria, flank pain and difficulty urinating.  Musculoskeletal: Denies myalgias, back pain, joint swelling, arthralgias and gait problem.  Skin: Denies pallor, rash and wound.  Neurological: Denies dizziness, seizures, syncope, weakness, light-headedness, numbness and headaches.  Hematological: Denies  adenopathy. Easy bruising, personal or family bleeding history  Psychiatric/Behavioral: Denies suicidal ideation, mood changes, confusion, nervousness, sleep disturbance and agitation  Past Medical History  Diagnosis Date  . CAD (coronary artery disease)   . Atrial fibrillation   . CHF (congestive heart failure)   . Hyperlipidemia   . HTN (hypertension)   . MI (myocardial infarction)   . DM (diabetes mellitus)   . COPD (chronic obstructive pulmonary disease)   . Vertigo   . Anxiety   . Obesity   . Hx of CABG   . Gout    Past Surgical History  Procedure Laterality Date  . Coronary artery bypass graft  12/2001  . S/p dilatation and curettage     Social History:  reports that she quit smoking about 22 years ago. Her smoking use included Cigarettes. She smoked 0.00 packs per day. She does not have any smokeless tobacco history on file. She reports that she does not drink alcohol or use illicit drugs. Lives at home with her husband.  Allergies  Allergen Reactions  . Colcrys [Colchicine] Other (See Comments)    Dizzy and fill like pass out  . Metformin And Related Swelling  . Pravastatin Swelling  . Sulfa Antibiotics Swelling  . Zocor [Simvastatin] Hives and Swelling    Family History  Problem Relation Age of Onset  . Hip fracture Mother     died at 28 following hip fx  . Diabetes Sister     Prior to Admission medications   Medication Sig Start Date End Date Taking? Authorizing Provider  acetaminophen (TYLENOL) 500 MG tablet Take 500 mg by mouth every 6 (six) hours as needed for mild pain or headache.   Yes Historical Provider, MD  albuterol (PROVENTIL HFA;VENTOLIN HFA) 108 (90 BASE) MCG/ACT inhaler Inhale 2 puffs into  the lungs every 6 (six) hours as needed for wheezing. 04/21/13  Yes Dianne Dun, MD  allopurinol (ZYLOPRIM) 100 MG tablet Take 100 mg by mouth daily.  02/14/14  Yes Historical Provider, MD  amoxicillin (AMOXIL) 500 MG capsule Take 500 mg by mouth 2 (two) times  daily.  03/21/14  Yes Historical Provider, MD  aspirin EC 325 MG tablet Take 325 mg by mouth daily.    Yes Historical Provider, MD  cholecalciferol (VITAMIN D) 1000 UNITS tablet Take 1,000 Units by mouth daily.    Yes Historical Provider, MD  Cholecalciferol (VITAMIN D) 400 UNITS capsule Take 400 Units by mouth daily.   Yes Historical Provider, MD  enalapril (VASOTEC) 20 MG tablet Take 10 mg by mouth daily.   Yes Historical Provider, MD  furosemide (LASIX) 40 MG tablet Take 1 tablet (40 mg total) by mouth daily. 10/19/13  Yes Lewayne Bunting, MD  glipiZIDE (GLUCOTROL) 5 MG tablet TAKE TWO TABLETS BY MOUTH TWICE DAILY BEFORE A MEAL 11/18/13  Yes Dianne Dun, MD  insulin NPH (HUMULIN N,NOVOLIN N) 100 UNIT/ML injection Inject 82 Units into the skin 2 (two) times daily. 09/12/13  Yes Dianne Dun, MD  lovastatin (MEVACOR) 20 MG tablet TAKE ONE TABLET BY MOUTH AT BEDTIME   Yes Lewayne Bunting, MD  metoprolol (LOPRESSOR) 50 MG tablet TAKE ONE & ONE-HALF TABLETS BY MOUTH TWICE DAILY   Yes Lewayne Bunting, MD  Multiple Vitamin (MULTIVITAMIN) capsule Take 1 capsule by mouth daily.     Yes Historical Provider, MD  traMADol (ULTRAM) 50 MG tablet Take 1 tablet (50 mg total) by mouth every 8 (eight) hours as needed. 12/22/13  Yes Dianne Dun, MD  nitroGLYCERIN (NITROSTAT) 0.4 MG SL tablet Place 1 tablet (0.4 mg total) under the tongue every 5 (five) minutes as needed for chest pain. 04/21/13   Dianne Dun, MD   Physical Exam: Filed Vitals:   03/25/14 1332  BP: 119/54  Pulse: 74  Temp:   Resp:      General: Well developed, well nourished, NAD, appears stated age  HEENT: NCAT, PERRLA, EOMI, Anicteic Sclera, mucous membranes moist. Facial erythema  Neck: Supple, no JVD, no masses  Cardiovascular: S1 S2 auscultated, no rubs, murmurs or gallops. Regular rate and rhythm.  Respiratory: Clear to auscultation bilaterally with equal chest rise  Abdomen: Soft, obese, nontender, nondistended, + bowel  sounds  Extremities: warm dry without cyanosis clubbing. Trace LE edema.  Neuro: AAOx3, cranial nerves grossly intact. Strength 5/5 in patient's upper and lower extremities bilaterally  Skin: Facial erythema particularly forehead and orbital areas, improving  Psych: Normal affect and demeanor with intact judgement and insight  Labs on Admission:  Basic Metabolic Panel:  Recent Labs Lab 03/25/14 1340  NA 132*  K 6.3*  CL 95*  CO2 21  GLUCOSE 335*  BUN 57*  CREATININE 1.93*  CALCIUM 9.0   Liver Function Tests:  Recent Labs Lab 03/25/14 1340  AST 20  ALT 17  ALKPHOS 104  BILITOT 0.4  PROT 6.5  ALBUMIN 3.0*   No results found for this basename: LIPASE, AMYLASE,  in the last 168 hours No results found for this basename: AMMONIA,  in the last 168 hours CBC:  Recent Labs Lab 03/25/14 1340  WBC 6.1  NEUTROABS 3.6  HGB 11.4*  HCT 33.9*  MCV 89.2  PLT 169   Cardiac Enzymes:  Recent Labs Lab 03/25/14 1340  TROPONINI <0.30    BNP (last  3 results)  Recent Labs  03/25/14 1340  PROBNP 1683.0*   CBG: No results found for this basename: GLUCAP,  in the last 168 hours  Radiological Exams on Admission: Dg Chest 2 View  03/25/2014   CLINICAL DATA:  Rash discuss that a facial rash and swelling. Prior CABG.  EXAM: CHEST  2 VIEW  COMPARISON:  Chest CT 12/05/2009 in chest radiograph 10/22/2010  FINDINGS: The changes of median sternotomy for CABG. Probable coronary artery stent noted. Heart size appears within normal limits. Pulmonary vascularity is normal. The lungs are well expanded and clear. Negative for airspace disease or pleural effusion. There typical degenerative changes of the thoracic spine. Atherosclerotic calcification of the proximal abdominal aorta and splenic artery noted.  IMPRESSION: Prior CABG and coronary artery stent placement. No acute cardiopulmonary disease.   Electronically Signed   By: Britta MccreedySusan  Turner M.D.   On: 03/25/2014 14:19    EKG:  Independently reviewed. Sinus rhythm, rate 67, abnormal T wave  Assessment/Plan  Acute on chronic renal failure, stage III Patient will be admitted to a telemetry floor. Baseline creatinine is around 1.2-1.3. Currently 1.93. Possibly secondary to medications including diuretics, allopurinol versus dehydration. Will hold nephrotoxic agents at this time. Will not obtain urine electrolytes, as patient has been on diuretics. May consider renal ultrasound if creatinine is not improve. Will place patient on gentle IV fluid hydration.  Hyperkalemia Likely secondary to acute kidney injury. Potassium is 6.3. Patient was given calcium chloride, Kayexalate, insulin with D50, sodium bicarbonate. Will recheck potassium level at 2200. EKG showed some nonspecific T wave changes. Patient was placed on telemetry monitoring.  Facial rash Currently improving. Patient started having a facial rash on Monday, which worsened after she began amoxicillin. Currently facial rash is improving. Patient has had this in the past. She has been worked up extensively for this.  Gout Will hold allopurinol the patient's acute kidney injury for now.  Hypertension Lasix as well as enalapril. Will continue metoprolol with holding parameters as patient has had hypotension at home.  Generalized weakness and fatigue Will obtain a vitamin D level, TSH, vitamin B 12. Will also consult physical therapy.  Chronic Systolic CHF Patient currently not in exacerbation. Chest x-ray shows no acute cardiopulmonary disease. Last echocardiogram done on 10/23/2000 shows an EF of 45-50%. Patient was placed on fluid restriction, low sodium diet, daily weights, strict intake and output will be followed.  Diabetes mellitus Will hold glyburide. Will continue patient's home insulin regimen as well as insulin sliding scale with CBG monitoring. Will also obtain hemoglobin A1c.  Hyponatremia and hypochloremia Likely secondary to dehydration. Will  place patient on gentle IV fluid and continue to monitor BMP. Will also obtain a TSH level.  Hyperlipidemia Continue statin.  COPD Currently stable and compensated. Will continue albuterol inhaler and placed on oxygen to maintain her saturations above 92%.  Coronary artery disease status post CABG Currently stable. Patient is chest pain-free. Will continue to monitor and aspirin, metoprolol, statin. Will hold enalapril due to her acute kidney injury.  Bacteriuria UA shows 3-6 white blood cells however many squamous epithelial cells and small leukocytes. Will not treat with antibiotics at this time as patient is afebrile no leukocytosis, and no complaints of dysuria. Patient did take 2 doses of amoxicillin earlier this week.  DVT prophylaxis: SCDs  Code Status: Full  Condition: Guarded  Family Communication: Family at bedside. Admission, patients condition and plan of care including tests being ordered have been discussed with the patient  and family who indicate understanding and agree with the plan and Code Status.  Disposition Plan: Admitted    Time spent: 60 minutes  Yeshua Stryker D.O. [facility] Pager [phone]  If 7PM-7AM, please contact night-coverage [email or web address] Password [facility] 03/25/2014, 4:06 PM

## 2014-03-25 NOTE — ED Notes (Signed)
Pt complains of SOB and tingling in legs. O2 sats on room air 99%. Pt states she does have diabetic neuropathy.

## 2014-03-26 DIAGNOSIS — N179 Acute kidney failure, unspecified: Principal | ICD-10-CM

## 2014-03-26 LAB — CBC
HEMATOCRIT: 31.7 % — AB (ref 36.0–46.0)
HEMOGLOBIN: 10.7 g/dL — AB (ref 12.0–15.0)
MCH: 30 pg (ref 26.0–34.0)
MCHC: 33.8 g/dL (ref 30.0–36.0)
MCV: 88.8 fL (ref 78.0–100.0)
Platelets: 166 10*3/uL (ref 150–400)
RBC: 3.57 MIL/uL — ABNORMAL LOW (ref 3.87–5.11)
RDW: 17 % — ABNORMAL HIGH (ref 11.5–15.5)
WBC: 5.1 10*3/uL (ref 4.0–10.5)

## 2014-03-26 LAB — GLUCOSE, CAPILLARY
GLUCOSE-CAPILLARY: 212 mg/dL — AB (ref 70–99)
GLUCOSE-CAPILLARY: 251 mg/dL — AB (ref 70–99)
Glucose-Capillary: 142 mg/dL — ABNORMAL HIGH (ref 70–99)
Glucose-Capillary: 173 mg/dL — ABNORMAL HIGH (ref 70–99)

## 2014-03-26 LAB — HEMOGLOBIN A1C
HEMOGLOBIN A1C: 8.8 % — AB (ref <5.7)
MEAN PLASMA GLUCOSE: 206 mg/dL — AB (ref <117)

## 2014-03-26 LAB — BASIC METABOLIC PANEL
BUN: 44 mg/dL — AB (ref 6–23)
CO2: 26 mEq/L (ref 19–32)
Calcium: 9.2 mg/dL (ref 8.4–10.5)
Chloride: 101 mEq/L (ref 96–112)
Creatinine, Ser: 1.43 mg/dL — ABNORMAL HIGH (ref 0.50–1.10)
GFR calc Af Amer: 44 mL/min — ABNORMAL LOW (ref >90)
GFR, EST NON AFRICAN AMERICAN: 38 mL/min — AB (ref >90)
Glucose, Bld: 139 mg/dL — ABNORMAL HIGH (ref 70–99)
POTASSIUM: 4.2 meq/L (ref 3.7–5.3)
Sodium: 139 mEq/L (ref 137–147)

## 2014-03-26 LAB — VITAMIN B12: VITAMIN B 12: 664 pg/mL (ref 211–911)

## 2014-03-26 NOTE — Progress Notes (Signed)
Patient having c/o lower back pain/aching chronic in nature. PRN Tramadol administered as ordered. Patient currently resting comfortably, will continue to monitor. Cassandra Fisher

## 2014-03-26 NOTE — Progress Notes (Signed)
[facility] PROGRESS NOTE  Cassandra Fisher ZOX:[id number] DOB: 12/25/1950 DOA: 03/25/2014 PCP: [name], MD  Assessment/Plan: Acute on chronic renal failure, stage III  Patient will be admitted to a telemetry floor. Baseline creatinine is around 1.2-1.3. Currently 1.93. Possibly secondary to medications including diuretics, allopurinol and dehydration.  -IVF's change to NSL -patient advise to keep her hydration level by PO intake -repeat BMET in am, if Cr stable at baseline will discharge home.  Hyperkalemia  Resolved now -will repeat BMET in am  Facial rash  -Currently improving. Patient started having a facial rash on Monday, which worsened after she began amoxicillin (antibiotic discontinued). -Currently facial rash is improving. Patient has had this in the past (appears to be rosacea) -She has been worked up extensively for this.  -advise to keep skin clean and moisture and to avoid scartching  Gout  No flare at this moment -holding allopurinol given renal function abnormality  Hypertension  Stable. -will continue home regimen except for lasix and ACE. Generalized weakness and fatigue  Will obtain a vitamin D level is pending currently -TSH and B12 WNL -follow physical therapy rec's.   Chronic Systolic CHF  Patient currently compensated. Chest x-ray shows no acute cardiopulmonary disease. Last echocardiogram done on 10/23/2000 shows an EF of 45-50%.  Will continue holding diuretics one more day -IVF's switch to saline lock -patient advise to maintain PO hydration  Diabetes mellitus  A1C 8.8 -continue current insulin regimen and low carb diet -resume oral hypoglycemic agents at discharge  Hyponatremia and hypochloremia  Likely secondary to dehydration.  -resolved after IVF's -TSH WNL   Hyperlipidemia  Continue statin.   COPD  Currently stable and compensated. Will continue albuterol inhaler and placed on oxygen supplementation as needed to maintain her  saturations above 92%.   Coronary artery disease status post CABG  Currently stable. Patient is chest pain-free. Will continue to monitor and continue aspirin, metoprolol, statin.  -Will hold enalapril X1 more day due to her acute kidney injury.   Bacteriuria  UA shows 3-6 white blood cells however many squamous epithelial cells and small leukocytes. Will not treat with antibiotics at this time as patient is afebrile, no elevation of WBC's, and no complaints of dysuria.   Code Status: Full Family Communication: no family at bedside Disposition Plan: home in am    Consultants:  None   Procedures:  See below for x-ray reports   Antibiotics:  None   HPI/Subjective: Feeling better, denies SOB, CP, nausea vomiting or dysuria.  Objective: Filed Vitals:   03/26/14 0447  BP: 123/51  Pulse: 80  Temp: 97.9 F (36.6 C)  Resp: 18    Intake/Output Summary (Last 24 hours) at 03/26/14 1312 Last data filed at 03/25/14 2100  Gross per 24 hour  Intake    200 ml  Output      0 ml  Net    200 ml   Filed Weights   03/25/14 1228 03/25/14 1700 03/26/14 0447  Weight: 112.492 kg (248 lb) 114.624 kg (252 lb 11.2 oz) 114.488 kg (252 lb 6.4 oz)    Exam:   General:  NAD, afebrile; facial rash/erythema improving  Cardiovascular: S1 and S2, no rubs or gallops  Respiratory: CTA bilaterally  Abdomen: soft, NT, ND, positive BS  Musculoskeletal: trace edema bilaterally.  Data Reviewed: Basic Metabolic Panel:  Recent Labs Lab 03/25/14 1340 03/26/14 0555  NA 132* 139  K 6.3* 4.2  CL 95* 101  CO2 21 26  GLUCOSE 335*  139*  BUN 57* 44*  CREATININE 1.93* 1.43*  CALCIUM 9.0 9.2   Liver Function Tests:  Recent Labs Lab 03/25/14 1340  AST 20  ALT 17  ALKPHOS 104  BILITOT 0.4  PROT 6.5  ALBUMIN 3.0*   CBC:  Recent Labs Lab 03/25/14 1340 03/26/14 0555  WBC 6.1 5.1  NEUTROABS 3.6  --   HGB 11.4* 10.7*  HCT 33.9* 31.7*  MCV 89.2 88.8  PLT 169 166   Cardiac  Enzymes:  Recent Labs Lab 03/25/14 1340  TROPONINI <0.30   BNP (last 3 results)  Recent Labs  03/25/14 1340  PROBNP 1683.0*   CBG:  Recent Labs Lab 03/25/14 1729 03/25/14 2108 03/26/14 0639 03/26/14 1124  GLUCAP 259* 233* 142* 173*    Studies: Dg Chest 2 View  03/25/2014   CLINICAL DATA:  Rash discuss that a facial rash and swelling. Prior CABG.  EXAM: CHEST  2 VIEW  COMPARISON:  Chest CT 12/05/2009 in chest radiograph 10/22/2010  FINDINGS: The changes of median sternotomy for CABG. Probable coronary artery stent noted. Heart size appears within normal limits. Pulmonary vascularity is normal. The lungs are well expanded and clear. Negative for airspace disease or pleural effusion. There typical degenerative changes of the thoracic spine. Atherosclerotic calcification of the proximal abdominal aorta and splenic artery noted.  IMPRESSION: Prior CABG and coronary artery stent placement. No acute cardiopulmonary disease.   Electronically Signed   By: Britta Mccreedy M.D.   On: 03/25/2014 14:19    Scheduled Meds: . aspirin EC  325 mg Oral Daily  . cholecalciferol  1,000 Units Oral Daily  . docusate sodium  100 mg Oral BID  . insulin aspart  0-15 Units Subcutaneous TID WC  . insulin aspart  4 Units Subcutaneous Once  . insulin NPH Human  82 Units Subcutaneous BID AC  . lovastatin  20 mg Oral q1800  . metoprolol  50 mg Oral BID  . multivitamin with minerals  1 tablet Oral Daily  . sodium chloride  3 mL Intravenous Q12H   Continuous Infusions: . sodium chloride 50 mL/hr at 03/25/14 1900     Time spent: < 30 minutes    Vassie Loll  [facility] Pager [phone]. If 7PM-7AM, please contact night-coverage at [email or web address], password [facility] 03/26/2014, 1:12 PM  LOS: 1 day

## 2014-03-26 NOTE — Progress Notes (Signed)
PT Cancellation Note  Patient Details Name: Cassandra Fisher MRN: [id number] DOB: August 09, 1951   Cancelled Treatment:    Reason Eval/Treat Not Completed: PT screened, no needs identified, will sign off.  Discussed with RN and patient, pt is independent, denies problems or changes in functional mobility and declines PT intervention at this time. Advised to reconsult PT if status changes, otherwise will sign off.    Dennis Bast 03/26/2014, 1:35 PM

## 2014-03-27 DIAGNOSIS — N189 Chronic kidney disease, unspecified: Secondary | ICD-10-CM

## 2014-03-27 DIAGNOSIS — E669 Obesity, unspecified: Secondary | ICD-10-CM

## 2014-03-27 LAB — VITAMIN D 25 HYDROXY (VIT D DEFICIENCY, FRACTURES): Vit D, 25-Hydroxy: 26 ng/mL — ABNORMAL LOW (ref 30–89)

## 2014-03-27 LAB — BASIC METABOLIC PANEL WITH GFR
BUN: 27 mg/dL — ABNORMAL HIGH (ref 6–23)
CO2: 24 meq/L (ref 19–32)
Calcium: 9.6 mg/dL (ref 8.4–10.5)
Chloride: 103 meq/L (ref 96–112)
Creatinine, Ser: 1.11 mg/dL — ABNORMAL HIGH (ref 0.50–1.10)
GFR calc Af Amer: 60 mL/min — ABNORMAL LOW
GFR calc non Af Amer: 52 mL/min — ABNORMAL LOW
Glucose, Bld: 243 mg/dL — ABNORMAL HIGH (ref 70–99)
Potassium: 5.1 meq/L (ref 3.7–5.3)
Sodium: 142 meq/L (ref 137–147)

## 2014-03-27 LAB — GLUCOSE, CAPILLARY
Glucose-Capillary: 182 mg/dL — ABNORMAL HIGH (ref 70–99)
Glucose-Capillary: 239 mg/dL — ABNORMAL HIGH (ref 70–99)

## 2014-03-27 NOTE — Discharge Summary (Signed)
Physician Discharge Summary  Cassandra Fisher ZOX:[id number] DOB: [birth year] DOA: 03/25/2014  PCP: [name], MD  Admit date: 03/25/2014 Discharge date: 03/27/2014  Time spent: >30 minutes  Recommendations for Outpatient Follow-up:  1. Basic metabolic panel to follow electrolytes and renal function 2. Reassess blood pressure and adjust antihypertensive regimen as needed 3. Follow Vit D level pending at discharge and start repletion if needed  Discharge Diagnoses:  Hyperkalemia Acute on chronic renal failure (stage 2-3 at baseline) Hypertension Diabetes Atrial fibrillation Chronic diastolic and systolic heart failure (last ejection fraction 45-50%) Obesity Hyperlipidemia COPD Coronary artery disease status post CABG   Discharge Condition: A stable and improved. Patient has been discharged home with instruction to keep herself hydrated and to arrange followup with primary care physician in one week.  Diet recommendation: Low-sodium diet, low carbohydrates  Filed Weights   03/25/14 1700 03/26/14 0447 03/27/14 0702  Weight: 114.624 kg (252 lb 11.2 oz) 114.488 kg (252 lb 6.4 oz) 114.624 kg (252 lb 11.2 oz)    History of present illness:  63 y.o. female With a history of CHF, coronary artery disease, hypertension that presents to the emergency department for generalized weakness. Patient states that she's been feeling unwell and very weak the last few days. She saw her physician on Monday and was found to have a urinary tract infection. Patient also noted that she had a facial rash on Monday. She was started on amoxicillin on Tuesday for urinary tract infection, her rash worsened. Patient broke out with blisters. Her rash began to improve once amoxicillin was stopped and she used Benadryl. Patient continued to feel weak and unwell over the past week. She denies any chest pain or shortness of breath. She denies any fevers or ill contacts or recent travel. Patient also admits to having  constipation.   Hospital Course:  Acute on chronic renal failure, stage II-III  Baseline creatinine is around 1.2-1.3. On admission 1.93. Possibly secondary to medications including diuretics, allopurinol, continue use of ACE inhibitors and dehydration.  -Resolved with IV fluid resuscitation -At discharge creatinine back to baseline -Patient without any further episodes of nausea/vomiting or difficulty maintaining herself hydrated  -Will recommend repletion of basic metabolic panel to follow kidney function and electrolytes on followup visit  Hyperkalemia  -Resolved at discharge -Secondary to acute on chronic renal failure and continue use of ACE inhibitors  Facial rash  -Currently improving. Patient started having a facial rash on Monday, which worsened after she began amoxicillin (antibiotic has been discontinued).  -Currently facial rash is improving. Patient has had this in the past (appears to be rosacea)  -She has been worked up extensively for this.  -advise to keep skin clean and moistured and to avoid scartching   Gout  -No flare in long time according to patient  -holding allopurinol given recent renal function abnormality   Hypertension  -Stable to slightly elevated after stopping part of her antihypertensive drugs for first 24 hours..  -will resume home medication regimen and patient will follow with PCP for adjustments  Generalized weakness and fatigue  -Follow vitamin D level (pending at discharge) and a start repletion if needed -TSH and B12 WNL   Chronic Systolic and diastolic CHF  Patient currently compensated. Chest x-ray shows no acute cardiopulmonary disease. Last echocardiogram done on showed an EF of 45-50%.  -will resume home diuretics -patient advise to follow low sodium diet and to check her weight on daily basis  Diabetes mellitus  -A1C 8.8  -continue current  insulin regimen and low carb diet  -resume oral hypoglycemic agents at discharge  -Followup  with primary care doctor for further adjustment to her hypoglycemic regimen  Hyponatremia and hypochloremia  -Likely secondary to dehydration.  -resolved after IVF's resuscitation -TSH WNL   Hyperlipidemia  Continue statin.   COPD  -Currently stable and compensated.  -Continue PRN albuterol  Coronary artery disease status post CABG  -Stable. Patient remained chest pain-free during admission.  -Will continue aspirin, metoprolol, statin and resume enalapril.   Bacteriuria  UA shows 3-6 white blood cells however many squamous epithelial cells and small leukocytes. Will not treat with antibiotics at this time as patient is afebrile, no elevation of WBC's, and no complaints of dysuria.    Procedures: See below for x-ray reports  Consultations:  None  Discharge Exam: Filed Vitals:   03/27/14 1036  BP: 141/62  Pulse: 79  Temp:   Resp:    General: NAD, afebrile; facial rash/erythema improving  Cardiovascular: S1 and S2, no rubs or gallops  Respiratory: CTA bilaterally  Abdomen: soft, NT, ND, positive BS  Musculoskeletal: trace edema bilaterally; no cyanosis   Discharge Instructions You were cared for by a hospitalist during your hospital stay. If you have any questions about your discharge medications or the care you received while you were in the hospital after you are discharged, you can call the unit and asked to speak with the hospitalist on call if the hospitalist that took care of you is not available. Once you are discharged, your primary care physician will handle any further medical issues. Please note that NO REFILLS for any discharge medications will be authorized once you are discharged, as it is imperative that you return to your primary care physician (or establish a relationship with a primary care physician if you do not have one) for your aftercare needs so that they can reassess your need for medications and monitor your lab values.  Discharge Orders   Future  Orders Complete By Expires   Diet - low sodium heart healthy  As directed    Discharge instructions  As directed        Medication List    STOP taking these medications       allopurinol 100 MG tablet  Commonly known as:  ZYLOPRIM     amoxicillin 500 MG capsule  Commonly known as:  AMOXIL      TAKE these medications       acetaminophen 500 MG tablet  Commonly known as:  TYLENOL  Take 500 mg by mouth every 6 (six) hours as needed for mild pain or headache.     albuterol 108 (90 BASE) MCG/ACT inhaler  Commonly known as:  PROVENTIL HFA;VENTOLIN HFA  Inhale 2 puffs into the lungs every 6 (six) hours as needed for wheezing.     aspirin EC 325 MG tablet  Take 325 mg by mouth daily.     cholecalciferol 1000 UNITS tablet  Commonly known as:  VITAMIN D  Take 1,000 Units by mouth daily.     Vitamin D 400 UNITS capsule  Take 400 Units by mouth daily.     enalapril 20 MG tablet  Commonly known as:  VASOTEC  Take 10 mg by mouth daily.     furosemide 40 MG tablet  Commonly known as:  LASIX  Take 1 tablet (40 mg total) by mouth daily.     glipiZIDE 5 MG tablet  Commonly known as:  GLUCOTROL  TAKE TWO TABLETS BY  MOUTH TWICE DAILY BEFORE A MEAL     insulin NPH Human 100 UNIT/ML injection  Commonly known as:  HUMULIN N,NOVOLIN N  Inject 82 Units into the skin 2 (two) times daily.     lovastatin 20 MG tablet  Commonly known as:  MEVACOR  TAKE ONE TABLET BY MOUTH AT BEDTIME     metoprolol 50 MG tablet  Commonly known as:  LOPRESSOR  TAKE ONE & ONE-HALF TABLETS BY MOUTH TWICE DAILY     multivitamin capsule  Take 1 capsule by mouth daily.     nitroGLYCERIN 0.4 MG SL tablet  Commonly known as:  NITROSTAT  Place 1 tablet (0.4 mg total) under the tongue every 5 (five) minutes as needed for chest pain.     traMADol 50 MG tablet  Commonly known as:  ULTRAM  Take 1 tablet (50 mg total) by mouth every 8 (eight) hours as needed.       Allergies  Allergen Reactions  .  Colcrys [Colchicine] Other (See Comments)    Dizzy and fill like pass out  . Metformin And Related Swelling  . Pravastatin Swelling  . Sulfa Antibiotics Swelling  . Zocor [Simvastatin] Hives and Swelling       Follow-up Information   Follow up with [name], MD In 1 week.   Specialty:  Family Medicine   Contact information:   [address] [place] [address] [phone]        The results of significant diagnostics from this hospitalization (including imaging, microbiology, ancillary and laboratory) are listed below for reference.    Significant Diagnostic Studies: Dg Chest 2 View  03/25/2014   CLINICAL DATA:  Rash discuss that a facial rash and swelling. Prior CABG.  EXAM: CHEST  2 VIEW  COMPARISON:  Chest CT 12/05/2009 in chest radiograph 10/22/2010  FINDINGS: The changes of median sternotomy for CABG. Probable coronary artery stent noted. Heart size appears within normal limits. Pulmonary vascularity is normal. The lungs are well expanded and clear. Negative for airspace disease or pleural effusion. There typical degenerative changes of the thoracic spine. Atherosclerotic calcification of the proximal abdominal aorta and splenic artery noted.  IMPRESSION: Prior CABG and coronary artery stent placement. No acute cardiopulmonary disease.   Electronically Signed   By: Britta Mccreedy M.D.   On: 03/25/2014 14:19   Labs: Basic Metabolic Panel:  Recent Labs Lab 03/25/14 1340 03/26/14 0555 03/27/14 1031  NA 132* 139 142  K 6.3* 4.2 5.1  CL 95* 101 103  CO2 21 26 24   GLUCOSE 335* 139* 243*  BUN 57* 44* 27*  CREATININE 1.93* 1.43* 1.11*  CALCIUM 9.0 9.2 9.6   Liver Function Tests:  Recent Labs Lab 03/25/14 1340  AST 20  ALT 17  ALKPHOS 104  BILITOT 0.4  PROT 6.5  ALBUMIN 3.0*   CBC:  Recent Labs Lab 03/25/14 1340 03/26/14 0555  WBC 6.1 5.1  NEUTROABS 3.6  --   HGB 11.4* 10.7*  HCT 33.9* 31.7*  MCV 89.2 88.8  PLT 169 166   Cardiac  Enzymes:  Recent Labs Lab 03/25/14 1340  TROPONINI <0.30   BNP: BNP (last 3 results)  Recent Labs  03/25/14 1340  PROBNP 1683.0*   CBG:  Recent Labs Lab 03/26/14 1124 03/26/14 1647 03/26/14 2231 03/27/14 0603 03/27/14 1133  GLUCAP 173* 212* 251* 182* 239*    Signed:  Vassie Loll  [facility] 03/27/2014, 12:28 PM

## 2014-03-27 NOTE — Care Management Note (Signed)
    Page 1 of 1   03/27/2014     11:44:49 AM   CARE MANAGEMENT NOTE 03/27/2014  Patient:  Cassandra Fisher, Cassandra Fisher   Account Number:  [id number]  Date Initiated:  03/27/2014  Documentation initiated by:  [facility]  Subjective/Objective Assessment:   63 y.o. female PMHX of CHF, coronary artery disease, hypertension that presents to the ED for generalized weakness; hyperkalemia//Home with spouse     Action/Plan:   Calcium Chloride; gentle IV fluid hydration//Access for Home Health needs   Anticipated DC Date:  03/27/2014   Anticipated DC Plan:  HOME/SELF CARE      DC Planning Services  CM consult      Choice offered to / List presented to:             Status of service:   Medicare Important Message given?   (If response is "NO", the following Medicare IM given date fields will be blank) Date Medicare IM given:   Date Additional Medicare IM given:    Discharge Disposition:    Per UR Regulation:    If discussed at Long Length of Stay Meetings, dates discussed:    Comments:

## 2014-03-27 NOTE — Progress Notes (Signed)
Utilization Review Completed Darnette Lampron J. Jennilee Demarco, RN, BSN, NCM [phone]  

## 2014-03-28 ENCOUNTER — Telehealth: Payer: Self-pay | Admitting: Family Medicine

## 2014-03-28 NOTE — Telephone Encounter (Signed)
Transitional Care Call.  D/C'd from [facility] on 03/27/14.  D/C dx: Hyperkalemia, Acute on chronic renal failure (stage 2-3 at baseline),  Hypertension, Diabetes, Atrial fibrillation, Chronic diastolic and systolic heart failure (last ejection fraction 45-50%),  Obesity,  Hyperlipidemia, COPD,  Coronary artery disease status post CABG.  Spoke with Cassandra Fisher.  She states that she is feeling "much better, just tired".  She denies pain.  States that she had been having SOB upon ambulation and bilateral LE edema until she started taking furosemide again today.  She says SOB and edema have improved since taking diuretic this morning.  She ambulates and performs ADLs independently.  Her husband lives with her in the home and is able to assist when needed.  She remains off of all gout medications until f/u appt with Dr. Dayton Martes.  Denies questions regarding d/c instructions and medication list.  Denies questions for Dr. Dayton Martes.  Follow-up appt scheduled for 04/03/14.

## 2014-03-31 ENCOUNTER — Telehealth: Payer: Self-pay | Admitting: Family Medicine

## 2014-03-31 MED ORDER — CYCLOBENZAPRINE HCL 5 MG PO TABS: 5.0000 mg | ORAL_TABLET | Freq: Three times a day (TID) | ORAL | Status: DC | PRN

## 2014-03-31 NOTE — Telephone Encounter (Signed)
Pt was prescribed a muscle relaxer a while ago. Back spasms are starting to happen again. Pt can hardey stand the pain. Last seen for back pain 12/22/2013. Can we call in more medication or does she need another visit? Please advise

## 2014-03-31 NOTE — Telephone Encounter (Signed)
Spoke to pt and advised. She states that she has an upcoming appt 04/03/14

## 2014-03-31 NOTE — Telephone Encounter (Signed)
Will send in rx for flexeril to [facility] but needs to be seen next week if no improvement.  Please remind her that this medication is very sedating.  Should not drive after taking this.

## 2014-04-03 ENCOUNTER — Encounter: Payer: Self-pay | Admitting: Family Medicine

## 2014-04-03 ENCOUNTER — Ambulatory Visit (INDEPENDENT_AMBULATORY_CARE_PROVIDER_SITE_OTHER): Payer: BC Managed Care – PPO | Admitting: Family Medicine

## 2014-04-03 VITALS — BP 126/78 | HR 85 | Temp 97.4°F | Wt 255.5 lb

## 2014-04-03 DIAGNOSIS — E119 Type 2 diabetes mellitus without complications: Secondary | ICD-10-CM

## 2014-04-03 DIAGNOSIS — N179 Acute kidney failure, unspecified: Secondary | ICD-10-CM

## 2014-04-03 DIAGNOSIS — M109 Gout, unspecified: Secondary | ICD-10-CM

## 2014-04-03 DIAGNOSIS — N189 Chronic kidney disease, unspecified: Secondary | ICD-10-CM

## 2014-04-03 DIAGNOSIS — E875 Hyperkalemia: Secondary | ICD-10-CM

## 2014-04-03 DIAGNOSIS — E559 Vitamin D deficiency, unspecified: Secondary | ICD-10-CM

## 2014-04-03 DIAGNOSIS — I10 Essential (primary) hypertension: Secondary | ICD-10-CM

## 2014-04-03 LAB — BASIC METABOLIC PANEL
BUN: 34 mg/dL — ABNORMAL HIGH (ref 6–23)
CALCIUM: 9.2 mg/dL (ref 8.4–10.5)
CO2: 29 mEq/L (ref 19–32)
CREATININE: 1.6 mg/dL — AB (ref 0.4–1.2)
Chloride: 96 mEq/L (ref 96–112)
GFR: 33.63 mL/min — AB (ref >60.00)
Glucose, Bld: 252 mg/dL — ABNORMAL HIGH (ref 70–99)
Potassium: 4.3 mEq/L (ref 3.5–5.1)
SODIUM: 137 meq/L (ref 135–145)

## 2014-04-03 MED ORDER — FEBUXOSTAT 40 MG PO TABS
40.0000 mg | ORAL_TABLET | Freq: Every day | ORAL | Status: DC
Start: 2014-04-03 — End: 2014-06-27

## 2014-04-03 NOTE — Assessment & Plan Note (Signed)
Well controlled. No changes. 

## 2014-04-03 NOTE — Assessment & Plan Note (Signed)
Remains low at 26. Advised to increase vit D to 3000 IU daily. She will follow up with me in 3 months.

## 2014-04-03 NOTE — Progress Notes (Signed)
Pre visit review using our clinic review tool, if applicable. No additional management support is needed unless otherwise documented below in the visit note. 

## 2014-04-03 NOTE — Assessment & Plan Note (Signed)
Recheck electrolytes today.   

## 2014-04-03 NOTE — Assessment & Plan Note (Addendum)
Deteriorated likely due to prednisone use. Refer to endocrinology.

## 2014-04-03 NOTE — Assessment & Plan Note (Signed)
Likely acute on chronic.  Cr at baseline but still has CKD.  Refer to renal.

## 2014-04-03 NOTE — Patient Instructions (Signed)
We will call you with your lab results.  We are starting Uloric 40 mg daily (instead of allopurinol).  We will call you with your referrals.

## 2014-04-03 NOTE — Assessment & Plan Note (Signed)
D/c allopurinol due to renal disease.  Start uloric.  Follow up in 3 months.

## 2014-04-03 NOTE — Progress Notes (Signed)
Subjective:   Patient ID: Cassandra BurnPaula L Harwick, female    DOB: 03/21/1951, 63 y.o.   MRN: [id number]  Cassandra Burnaula L Ambrosino is a pleasant 63 y.o. year old female who presents to clinic today with Hospitalization Follow-up  on 04/03/2014  HPI: Notes reviewed.  Contacted by our office on 4/14 for transitional care call Vedia Pereyra(Blair Myers, RN).  Presented to ER with weakness.  Acute on chronic kidney disease- Cr. 1.93 (baseline 1.2-1.3).  Improved with IVFs, cr was back to baseline at DC.  Drinking more fluids and restarted her lasix. Lab Results  Component Value Date   CREATININE 1.11* 03/27/2014   Hyperkalemia- ? Due to acute on chronic KD.  Also resolved at discharge. Lab Results  Component Value Date   NA 142 03/27/2014   K 5.1 03/27/2014   CL 103 03/27/2014   CO2 24 03/27/2014   DM- has been lost to follow up with me and is not controlled.  Was on prednisone for gout for several months. On glipizide and humulin 82 units twice daily.  Prior to prednisone, FSBS 80-100.  Checks CBGs three times weekly. Denies any episodes of hypoglycemia.  Lab Results  Component Value Date   HGBA1C 8.8* 03/25/2014   Gout- seeing Dr. Barth KirksBeakman/Michelle Young- was on allopurinol and colcrys but stopped taking it due to her renal disease. Last gout flare two days ago in left elbow.  Colcrys resolved flare.  Patient Active Problem List   Diagnosis Date Noted  . Hyperkalemia 03/25/2014  . AKI (acute kidney injury) 03/25/2014  . Arthralgia 12/22/2013  . Elevated sed rate 09/09/2013  . Malar rash 09/06/2013  . Dysuria 09/06/2013  . Carotid stenosis 04/21/2013  . Hx of CABG   . CEREBROVASCULAR DISEASE 01/27/2011  . DM 11/27/2010  . HYPERLIPIDEMIA 11/27/2010  . OBESITY 11/27/2010  . ANXIETY 11/27/2010  . HYPERTENSION 11/27/2010  . MI 11/27/2010  . CAD 11/27/2010  . ATRIAL FIBRILLATION 11/27/2010  . CHF 11/27/2010  . COPD 11/27/2010  . BACK PAIN 11/27/2010  . VERTIGO 11/27/2010   Past Medical History  Diagnosis  Date  . CAD (coronary artery disease)   . Atrial fibrillation   . CHF (congestive heart failure)   . Hyperlipidemia   . HTN (hypertension)   . MI (myocardial infarction)   . DM (diabetes mellitus)   . COPD (chronic obstructive pulmonary disease)   . Vertigo   . Anxiety   . Obesity   . Hx of CABG   . Gout    Past Surgical History  Procedure Laterality Date  . Coronary artery bypass graft  12/2001  . S/p dilatation and curettage     History  Substance Use Topics  . Smoking status: Former Smoker    Types: Cigarettes    Quit date: 09/01/1991  . Smokeless tobacco: Not on file     Comment: has a 30 pack year history, quit in late 1990s  . Alcohol Use: No   Family History  Problem Relation Age of Onset  . Hip fracture Mother     died at 7784 following hip fx  . Diabetes Sister    Allergies  Allergen Reactions  . Colcrys [Colchicine] Other (See Comments)    Dizzy and fill like pass out  . Metformin And Related Swelling  . Pravastatin Swelling  . Sulfa Antibiotics Swelling  . Zocor [Simvastatin] Hives and Swelling   Current Outpatient Prescriptions on File Prior to Visit  Medication Sig Dispense Refill  . acetaminophen (TYLENOL) 500 MG  tablet Take 500 mg by mouth every 6 (six) hours as needed for mild pain or headache.      . albuterol (PROVENTIL HFA;VENTOLIN HFA) 108 (90 BASE) MCG/ACT inhaler Inhale 2 puffs into the lungs every 6 (six) hours as needed for wheezing.  1 Inhaler  3  . aspirin EC 325 MG tablet Take 325 mg by mouth daily.       . cholecalciferol (VITAMIN D) 1000 UNITS tablet Take 1,000 Units by mouth daily.       . Cholecalciferol (VITAMIN D) 400 UNITS capsule Take 400 Units by mouth daily.      . cyclobenzaprine (FLEXERIL) 5 MG tablet Take 1 tablet (5 mg total) by mouth 3 (three) times daily as needed for muscle spasms.  20 tablet  0  . enalapril (VASOTEC) 20 MG tablet Take 10 mg by mouth daily.      . furosemide (LASIX) 40 MG tablet Take 1 tablet (40 mg total)  by mouth daily.  30 tablet  10  . glipiZIDE (GLUCOTROL) 5 MG tablet TAKE TWO TABLETS BY MOUTH TWICE DAILY BEFORE A MEAL  120 tablet  5  . insulin NPH (HUMULIN N,NOVOLIN N) 100 UNIT/ML injection Inject 82 Units into the skin 2 (two) times daily.  5 vial  3  . lovastatin (MEVACOR) 20 MG tablet TAKE ONE TABLET BY MOUTH AT BEDTIME      . metoprolol (LOPRESSOR) 50 MG tablet TAKE ONE & ONE-HALF TABLETS BY MOUTH TWICE DAILY  270 tablet  0  . Multiple Vitamin (MULTIVITAMIN) capsule Take 1 capsule by mouth daily.        . nitroGLYCERIN (NITROSTAT) 0.4 MG SL tablet Place 1 tablet (0.4 mg total) under the tongue every 5 (five) minutes as needed for chest pain.  25 tablet  3  . traMADol (ULTRAM) 50 MG tablet Take 1 tablet (50 mg total) by mouth every 8 (eight) hours as needed.  30 tablet  0   No current facility-administered medications on file prior to visit.   The PMH, PSH, Social History, Family History, Medications, and allergies have been reviewed in [facility], and have been updated if relevant.   Review of Systems    See HPI +still feels weak "All over." Objective:    BP 126/78  Pulse 85  Temp(Src) 97.4 F (36.3 C) (Oral)  Wt 255 lb 8 oz (115.894 kg)  SpO2 95%   Physical Exam General: Appears her stated age, well developed, well nourished in NAD.  Cardiovascular: Normal rate and rhythm. S1,S2 noted. No murmur, rubs or gallops noted. No JVD or BLE edema. No carotid bruits noted.  Pulmonary/Chest: Normal effort and positive vesicular breath sounds. No respiratory distress. No wheezes, rales or ronchi noted.  Abdomen: Soft and nontender. Normal bowel sounds, no bruits noted. No distention or masses noted. Liver, spleen and kidneys non palpable. Tender to palpation over the bladder area. No CVA tenderness. Ext: No edema Diabetic foot exam: Normal inspection No skin breakdown No calluses  Normal DP pulses Normal sensation to light touch and monofilament Nails thickened and  discolored          Assessment & Plan:   Hyperkalemia - Plan: Basic metabolic panel  HYPERTENSION  AKI (acute kidney injury) - Plan: Basic metabolic panel  DM - Plan: HM DIABETES FOOT EXAM, Ambulatory referral to Endocrinology No Follow-up on file.

## 2014-04-04 ENCOUNTER — Telehealth: Payer: Self-pay

## 2014-04-04 ENCOUNTER — Telehealth: Payer: Self-pay | Admitting: Family Medicine

## 2014-04-04 LAB — VITAMIN D 1,25 DIHYDROXY
Vitamin D 1, 25 (OH)2 Total: 34 pg/mL (ref 18–72)
Vitamin D2 1, 25 (OH)2: <8 pg/mL
Vitamin D3 1, 25 (OH)2: 34 pg/mL

## 2014-04-04 NOTE — Telephone Encounter (Signed)
Relevant patient education mailed to patient.  

## 2014-04-04 NOTE — Telephone Encounter (Signed)
Pt left v/m; pt could not afford Uloric,cost to pt $70.00 and pt did not pick up med.Please advise.[facility] [address]

## 2014-04-07 NOTE — Telephone Encounter (Signed)
Let's hold off and see if her renal doctor has samples.Marland Kitchen

## 2014-04-12 ENCOUNTER — Telehealth: Payer: Self-pay

## 2014-04-12 NOTE — Telephone Encounter (Signed)
Relevant patient education mailed to patient.  

## 2014-05-10 ENCOUNTER — Other Ambulatory Visit: Payer: Self-pay | Admitting: Cardiology

## 2014-05-24 ENCOUNTER — Other Ambulatory Visit: Payer: Self-pay | Admitting: *Deleted

## 2014-05-24 MED ORDER — METOPROLOL TARTRATE 50 MG PO TABS: ORAL_TABLET | ORAL | Status: DC

## 2014-05-24 MED ORDER — LOVASTATIN 20 MG PO TABS: ORAL_TABLET | ORAL | Status: DC

## 2014-05-25 ENCOUNTER — Encounter: Payer: Self-pay | Admitting: Physician Assistant

## 2014-05-25 ENCOUNTER — Telehealth: Payer: Self-pay | Admitting: *Deleted

## 2014-05-25 ENCOUNTER — Ambulatory Visit (INDEPENDENT_AMBULATORY_CARE_PROVIDER_SITE_OTHER): Payer: BC Managed Care – PPO | Admitting: Physician Assistant

## 2014-05-25 VITALS — BP 143/69 | HR 67 | Ht 67.0 in | Wt 256.0 lb

## 2014-05-25 DIAGNOSIS — I2510 Atherosclerotic heart disease of native coronary artery without angina pectoris: Secondary | ICD-10-CM

## 2014-05-25 DIAGNOSIS — I5022 Chronic systolic (congestive) heart failure: Secondary | ICD-10-CM

## 2014-05-25 DIAGNOSIS — N183 Chronic kidney disease, stage 3 unspecified: Secondary | ICD-10-CM | POA: Insufficient documentation

## 2014-05-25 DIAGNOSIS — I10 Essential (primary) hypertension: Secondary | ICD-10-CM

## 2014-05-25 DIAGNOSIS — I6529 Occlusion and stenosis of unspecified carotid artery: Secondary | ICD-10-CM

## 2014-05-25 DIAGNOSIS — I4891 Unspecified atrial fibrillation: Secondary | ICD-10-CM

## 2014-05-25 DIAGNOSIS — E785 Hyperlipidemia, unspecified: Secondary | ICD-10-CM

## 2014-05-25 DIAGNOSIS — I429 Cardiomyopathy, unspecified: Secondary | ICD-10-CM

## 2014-05-25 DIAGNOSIS — I428 Other cardiomyopathies: Secondary | ICD-10-CM

## 2014-05-25 LAB — BASIC METABOLIC PANEL
BUN: 43 mg/dL — AB (ref 6–23)
CALCIUM: 9.5 mg/dL (ref 8.4–10.5)
CO2: 27 meq/L (ref 19–32)
CREATININE: 1.4 mg/dL — AB (ref 0.4–1.2)
Chloride: 101 mEq/L (ref 96–112)
GFR: 41.02 mL/min — ABNORMAL LOW (ref >60.00)
GLUCOSE: 191 mg/dL — AB (ref 70–99)
Potassium: 4.6 mEq/L (ref 3.5–5.1)
Sodium: 137 mEq/L (ref 135–145)

## 2014-05-25 MED ORDER — LOVASTATIN 20 MG PO TABS: ORAL_TABLET | ORAL | Status: DC

## 2014-05-25 MED ORDER — METOPROLOL TARTRATE 50 MG PO TABS: ORAL_TABLET | ORAL | Status: DC

## 2014-05-25 MED ORDER — FUROSEMIDE 40 MG PO TABS: 40.0000 mg | ORAL_TABLET | Freq: Every day | ORAL | Status: DC

## 2014-05-25 MED ORDER — NITROGLYCERIN 0.4 MG SL SUBL: 0.4000 mg | SUBLINGUAL_TABLET | SUBLINGUAL | Status: AC | PRN

## 2014-05-25 MED ORDER — ENALAPRIL MALEATE 20 MG PO TABS: ORAL_TABLET | ORAL | Status: DC

## 2014-05-25 NOTE — Telephone Encounter (Signed)
pt notified about lab results with verbal understanding; refills sent in for cardiac meds today

## 2014-05-25 NOTE — Progress Notes (Signed)
Cardiology Office Note   Date:  05/25/2014   ID:  Cassandra Fisher, DOB 04/06/1951, MRN [id number]  PCP:  Cassandra Mannanalia Aron, MD  Cardiologist:  Dr. Olga MillersBrian Fisher      History of Present Illness: Cassandra Fisher is a 63 y.o. female with a hx of CAD status post coronary bypass graft and atrial fibrillation.  Patient was admitted in Nov 2011 with atrial fibrillation with a rapid ventricular response. Troponin mildly increased. TSH normal. Echocardiogram in November of 2011 showed an ejection fraction of 45-50% with mild mitral regurgitation. Cardiac catheterization in Nov 2011 revealed an ejection fraction of 50%, severe three-vessel coronary artery disease status post 6-vessel coronary artery bypass graft with 6 patent bypass grafts. Severe disease in the proximal portion of the previously stented circumflex vessel that is unchanged from films in 2008 but is unprotected by a vein graft. Medical therapy was recommended.  The patient previously declined Coumadin because of financial issues.   Last seen by Dr. Olga MillersBrian Fisher in 01/2013.   Since then, the patient was admitted in 03/2014. She had acute renal failure and hyperkalemia. It was suspected she had some type of allergic reaction. The patient seems to think that this was related to allopurinol. Notes indicate she had amoxicillin started prior to this. In follow up, her creatinine has remained elevated. She has established with nephrology (Dr. Arrie Fisher).  Her Lasix dose was reduced after discharge. She had worsening dyspnea with this and resumed her usual dose. Today she notes that she is doing well. She denies chest pain, shortness of breath, syncope, orthopnea, PND or edema.   Studies:  - LHC (10/2010):  6 bypass grafts patent,Severe disease in the proximal portion of the previously stented circumflex vessel that is unchanged from films in 2008 but is unprotected by a vein graft. Medical therapy was recommended.   - Echo (10/2010): EF 45% to 50%. Diffuse  hypokinesis. Mitral valve: Mild regurgitation.  - Carotid US (03/2014):  R 60-79%; L 40-59% - repeat 1 year   Recent Labs: 09/06/2013: Direct LDL 84.6; HDL Cholesterol by NMR 35.80*  03/25/2014: ALT 17; Pro B Natriuretic peptide (BNP) 1683.0*; TSH 1.740  03/26/2014: Hemoglobin 10.7*  04/03/2014: Creatinine 1.6*; Potassium 4.3   Wt Readings from Last 3 Encounters:  05/25/14 256 lb (116.121 kg)  04/03/14 255 lb 8 oz (115.894 kg)  03/27/14 252 lb 11.2 oz (114.624 kg)     Past Medical History  Diagnosis Date  . CAD (coronary artery disease)   . Atrial fibrillation   . CHF (congestive heart failure)   . Hyperlipidemia   . HTN (hypertension)   . MI (myocardial infarction)   . DM (diabetes mellitus)   . COPD (chronic obstructive pulmonary disease)   . Vertigo   . Anxiety   . Obesity   . Hx of CABG   . Gout     Current Outpatient Prescriptions  Medication Sig Dispense Refill  . Acetaminophen (ARTHRITIS PAIN RELIEVER PO) Take 650 mg by mouth every 8 (eight) hours as needed.       Marland Kitchen. acetaminophen (TYLENOL) 500 MG tablet Take 500 mg by mouth every 6 (six) hours as needed for mild pain or headache.      . albuterol (PROVENTIL HFA;VENTOLIN HFA) 108 (90 BASE) MCG/ACT inhaler Inhale 2 puffs into the lungs every 6 (six) hours as needed for wheezing.  1 Inhaler  3  . aspirin EC 325 MG tablet Take 325 mg by mouth daily.       .Marland Kitchen  cholecalciferol (VITAMIN D) 1000 UNITS tablet Take 1,000 Units by mouth daily.       . Cholecalciferol (VITAMIN D) 400 UNITS capsule Take 400 Units by mouth daily.      Marland Kitchen COLCRYS 0.6 MG tablet Take 0.6 mg by mouth daily. gout      . cyclobenzaprine (FLEXERIL) 5 MG tablet Take 1 tablet (5 mg total) by mouth 3 (three) times daily as needed for muscle spasms.  20 tablet  0  . enalapril (VASOTEC) 20 MG tablet TAKE ONE TABLET BY MOUTH ONCE DAILY *PATIENT NEEDS APPOINTMENT*  30 tablet  0  . febuxostat (ULORIC) 40 MG tablet Take 1 tablet (40 mg total) by mouth daily.  30  tablet  3  . furosemide (LASIX) 40 MG tablet Take 1 tablet (40 mg total) by mouth daily.  30 tablet  10  . glipiZIDE (GLUCOTROL) 5 MG tablet TAKE TWO TABLETS BY MOUTH TWICE DAILY BEFORE A MEAL  120 tablet  5  . insulin NPH (HUMULIN N,NOVOLIN N) 100 UNIT/ML injection Inject 82 Units into the skin 2 (two) times daily.  5 vial  3  . lovastatin (MEVACOR) 20 MG tablet TAKE ONE TABLET BY MOUTH AT BEDTIME  15 tablet  0  . metoprolol (LOPRESSOR) 50 MG tablet TAKE ONE & ONE-HALF TABLETS BY MOUTH TWICE DAILY  45 tablet  0  . Misc Natural Products (MIDNITE PM PO) Take by mouth. Sleep aid      . Multiple Vitamin (MULTIVITAMIN) capsule Take 1 capsule by mouth daily.        . nitroGLYCERIN (NITROSTAT) 0.4 MG SL tablet Place 1 tablet (0.4 mg total) under the tongue every 5 (five) minutes as needed for chest pain.  25 tablet  3  . traMADol (ULTRAM) 50 MG tablet Take 1 tablet (50 mg total) by mouth every 8 (eight) hours as needed.  30 tablet  0   No current facility-administered medications for this visit.    Allergies:   Colcrys; Metformin and related; Pravastatin; Sulfa antibiotics; and Zocor   Social History:  The patient  reports that she quit smoking about 22 years ago. Her smoking use included Cigarettes. She smoked 0.00 packs per day. She does not have any smokeless tobacco history on file. She reports that she does not drink alcohol or use illicit drugs.   Family History:  The patient's family history includes Diabetes in her sister; Hip fracture in her mother.   ROS:  Please see the history of present illness.      All other systems reviewed and negative.   PHYSICAL EXAM: VS:  BP 143/69  Pulse 67  Ht 5\' 7"  (1.702 m)  Wt 256 lb (116.121 kg)  BMI 40.09 kg/m2 Well nourished, well developed, in no acute distress HEENT: normal Neck: no JVD Cardiac:  normal S1, S2; RRR; no murmur Lungs:  clear to auscultation bilaterally, no wheezing, rhonchi or rales Abd: soft, nontender, no hepatomegaly Ext: no  edema Skin: warm and dry Neuro:  CNs 2-12 intact, no focal abnormalities noted  EKG:  NSR, RAD , IVCD, no change from prior tracing     ASSESSMENT AND PLAN:  1. CAD:  No angina. Continue aspirin,, statin, beta blocker. 2. Chronic systolic heart failure:  Volume stable on current regimen. 3. Cardiomyopathy:  Continue ACE inhibitor and beta blocker. 4. HTN (hypertension):  Borderline control. Continue to monitor. 5. Atrial fibrillation:  She continues to decline Coumadin. 6. Carotid stenosis:  Follow up carotid US due in 03/2015.  7. HYPERLIPIDEMIA:  Continue statin. 8. CKD (chronic kidney disease) stage 3, GFR 30-59 ml/min:  Continue followup with nephrology. Obtain repeat basic metabolic panel today. 9. Disposition: Followup with Dr. Jens Som in 6 months.    Signed, Brynda Rim, MHS 05/25/2014 12:03 PM    [facility] [address] [place], [place], [place]  [address] Phone: [phone]; Fax: [phone]

## 2014-05-25 NOTE — Patient Instructions (Signed)
LAB WORK TODAY; BMET  NO CHANGES WERE MADE TODAY WITH MEDICATIONS  Your physician wants you to follow-up in: 6 MONTHS WITH DR. CRENSAHW You will receive a reminder letter in the mail two months in advance. If you don't receive a letter, please call our office to schedule the follow-up appointment.

## 2014-06-27 ENCOUNTER — Encounter: Payer: Self-pay | Admitting: Internal Medicine

## 2014-06-27 ENCOUNTER — Ambulatory Visit (INDEPENDENT_AMBULATORY_CARE_PROVIDER_SITE_OTHER): Payer: BC Managed Care – PPO | Admitting: Internal Medicine

## 2014-06-27 VITALS — BP 122/78 | HR 62 | Temp 97.8°F | Wt 252.0 lb

## 2014-06-27 DIAGNOSIS — E119 Type 2 diabetes mellitus without complications: Secondary | ICD-10-CM

## 2014-06-27 DIAGNOSIS — E1165 Type 2 diabetes mellitus with hyperglycemia: Secondary | ICD-10-CM | POA: Insufficient documentation

## 2014-06-27 DIAGNOSIS — I10 Essential (primary) hypertension: Secondary | ICD-10-CM

## 2014-06-27 DIAGNOSIS — E785 Hyperlipidemia, unspecified: Secondary | ICD-10-CM

## 2014-06-27 LAB — LIPID PANEL
CHOL/HDL RATIO: 5
Cholesterol: 162 mg/dL (ref 0–200)
HDL: 35.8 mg/dL — AB (ref >39.00)
LDL Cholesterol: 65 mg/dL (ref 0–99)
NonHDL: 126.2
Triglycerides: 308 mg/dL — ABNORMAL HIGH (ref 0.0–149.0)
VLDL: 61.6 mg/dL — AB (ref 0.0–40.0)

## 2014-06-27 LAB — MICROALBUMIN / CREATININE URINE RATIO
Creatinine,U: 65.7 mg/dL
Microalb Creat Ratio: 0.3 mg/g (ref 0.0–30.0)
Microalb, Ur: 0.2 mg/dL (ref 0.0–1.9)

## 2014-06-27 LAB — HEMOGLOBIN A1C: Hgb A1c MFr Bld: 8.7 % — ABNORMAL HIGH (ref 4.6–6.5)

## 2014-06-27 NOTE — Progress Notes (Signed)
Subjective:    Patient ID: Cassandra Fisher, female    DOB: 05-08-1951, 63 y.o.   MRN: [id number]  HPI  Pt presents to the clinic today to follow up HTN, HLD and DM2:  HTN: Well controlled on Enalapril, Lasix and Lopressor.   HLD: Denies myalgias on Lovastatin.  DM2: Last A1C 8.8%. On Glipizide and NPH. She was referred to endocrinology by Dr. Dayton Martes. The patient had that referral cancelled, saying that she wanted to go to a different specialist than the one we had referred her to. She never made an appointment with any other endocrinologist. She does report now that she is off the prednisone.  Review of Systems      Past Medical History  Diagnosis Date  . CAD (coronary artery disease)   . Atrial fibrillation   . CHF (congestive heart failure)   . Hyperlipidemia   . HTN (hypertension)   . MI (myocardial infarction)   . DM (diabetes mellitus)   . COPD (chronic obstructive pulmonary disease)   . Vertigo   . Anxiety   . Obesity   . Hx of CABG   . Gout     Current Outpatient Prescriptions  Medication Sig Dispense Refill  . Acetaminophen (ARTHRITIS PAIN RELIEVER PO) Take 650 mg by mouth every 8 (eight) hours as needed.       Marland Kitchen acetaminophen (TYLENOL) 500 MG tablet Take 500 mg by mouth every 6 (six) hours as needed for mild pain or headache.      . albuterol (PROVENTIL HFA;VENTOLIN HFA) 108 (90 BASE) MCG/ACT inhaler Inhale 2 puffs into the lungs every 6 (six) hours as needed for wheezing.  1 Inhaler  3  . aspirin EC 325 MG tablet Take 325 mg by mouth daily.       . cholecalciferol (VITAMIN D) 1000 UNITS tablet Take 1,000 Units by mouth daily.       . Cholecalciferol (VITAMIN D) 400 UNITS capsule Take 400 Units by mouth daily.      Marland Kitchen COLCRYS 0.6 MG tablet Take 0.6 mg by mouth daily. gout      . cyclobenzaprine (FLEXERIL) 5 MG tablet Take 1 tablet (5 mg total) by mouth 3 (three) times daily as needed for muscle spasms.  20 tablet  0  . enalapril (VASOTEC) 20 MG tablet TAKE ONE  TABLET BY MOUTH ONCE DAILY  90 tablet  3  . febuxostat (ULORIC) 40 MG tablet Take 80 mg by mouth daily.      . furosemide (LASIX) 40 MG tablet Take 1 tablet (40 mg total) by mouth daily.  90 tablet  3  . glipiZIDE (GLUCOTROL) 5 MG tablet TAKE TWO TABLETS BY MOUTH TWICE DAILY BEFORE A MEAL  120 tablet  5  . insulin NPH (HUMULIN N,NOVOLIN N) 100 UNIT/ML injection Inject 82 Units into the skin 2 (two) times daily.  5 vial  3  . lovastatin (MEVACOR) 20 MG tablet TAKE ONE TABLET BY MOUTH AT BEDTIME  90 tablet  3  . metoprolol (LOPRESSOR) 50 MG tablet TAKE ONE & ONE-HALF TABLETS BY MOUTH TWICE DAILY  180 tablet  3  . Misc Natural Products (MIDNITE PM PO) Take by mouth. Sleep aid      . Multiple Vitamin (MULTIVITAMIN) capsule Take 1 capsule by mouth daily.        . nitroGLYCERIN (NITROSTAT) 0.4 MG SL tablet Place 1 tablet (0.4 mg total) under the tongue every 5 (five) minutes as needed for chest pain.  25 tablet  3  . traMADol (ULTRAM) 50 MG tablet Take 1 tablet (50 mg total) by mouth every 8 (eight) hours as needed.  30 tablet  0   No current facility-administered medications for this visit.    Allergies  Allergen Reactions  . Colcrys [Colchicine] Other (See Comments)    Dizzy and fill like pass out  . Metformin And Related Swelling  . Pravastatin Swelling  . Sulfa Antibiotics Swelling  . Zocor [Simvastatin] Hives and Swelling    Family History  Problem Relation Age of Onset  . Hip fracture Mother     died at 57 following hip fx  . Diabetes Sister     History   Social History  . Marital Status: Married    Spouse Name: N/A    Number of Children: N/A  . Years of Education: N/A   Occupational History  . Not on file.   Social History Main Topics  . Smoking status: Former Smoker    Types: Cigarettes    Quit date: 09/01/1991  . Smokeless tobacco: Not on file     Comment: has a 30 pack year history, quit in late 1990s  . Alcohol Use: No  . Drug Use: No  . Sexual Activity: Not on  file   Other Topics Concern  . Not on file   Social History Narrative  . No narrative on file     Constitutional: Denies fever, malaise, fatigue, headache or abrupt weight changes.  Respiratory: Denies difficulty breathing, shortness of breath, cough or sputum production.   Cardiovascular: Denies chest pain, chest tightness, palpitations or swelling in the hands or feet.  Gastrointestinal: Denies abdominal pain, bloating, constipation, diarrhea or blood in the stool.  Skin: Denies redness, rashes, lesions or ulcercations.  Neurological: Denies  Numbness or tingling in the hands or feet, dizziness, difficulty with memory, difficulty with speech or problems with balance and coordination.   No other specific complaints in a complete review of systems (except as listed in HPI above).  Objective:   Physical Exam  BP 122/78  Pulse 62  Temp(Src) 97.8 F (36.6 C) (Oral)  Wt 252 lb (114.306 kg)  SpO2 96% Wt Readings from Last 3 Encounters:  06/27/14 252 lb (114.306 kg)  05/25/14 256 lb (116.121 kg)  04/03/14 255 lb 8 oz (115.894 kg)    General: Appears her stated age, obese but well developed, well nourished in NAD. Skin: Warm, dry and intact. No rashes, lesions or ulcerations noted. Cardiovascular: Normal rate and rhythm. S1,S2 noted.  No murmur, rubs or gallops noted. No JVD or BLE edema. No carotid bruits noted. Pulmonary/Chest: Normal effort and positive vesicular breath sounds. No respiratory distress. No wheezes, rales or ronchi noted.   Neurological: Alert and oriented. Cranial nerves II-XII intact. Coordination normal. +DTRs bilaterally.   BMET    Component Value Date/Time   NA 137 05/25/2014 1226   K 4.6 05/25/2014 1226   CL 101 05/25/2014 1226   CO2 27 05/25/2014 1226   GLUCOSE 191* 05/25/2014 1226   BUN 43* 05/25/2014 1226   CREATININE 1.4* 05/25/2014 1226   CALCIUM 9.5 05/25/2014 1226   GFRNONAA 52* 03/27/2014 1031   GFRAA 60* 03/27/2014 1031    Lipid Panel       Component Value Date/Time   CHOL 174 09/06/2013 0905   TRIG 324.0* 09/06/2013 0905   HDL 35.80* 09/06/2013 0905   CHOLHDL 5 09/06/2013 0905   VLDL 64.8* 09/06/2013 0905   LDLCALC 78 02/10/2013 1344  CBC    Component Value Date/Time   WBC 5.1 03/26/2014 0555   RBC 3.57* 03/26/2014 0555   HGB 10.7* 03/26/2014 0555   HCT 31.7* 03/26/2014 0555   PLT 166 03/26/2014 0555   MCV 88.8 03/26/2014 0555   MCH 30.0 03/26/2014 0555   MCHC 33.8 03/26/2014 0555   RDW 17.0* 03/26/2014 0555   LYMPHSABS 1.9 03/25/2014 1340   MONOABS 0.4 03/25/2014 1340   EOSABS 0.1 03/25/2014 1340   BASOSABS 0.1 03/25/2014 1340    Hgb A1C Lab Results  Component Value Date   HGBA1C 8.8* 03/25/2014         Assessment & Plan:

## 2014-06-27 NOTE — Patient Instructions (Signed)

## 2014-06-27 NOTE — Assessment & Plan Note (Signed)
On lovastatin Will repeat lipid profile today  Will call you lab results and medication changes if needed

## 2014-06-27 NOTE — Assessment & Plan Note (Signed)
She has improved her diet Foot exam today Will check A1C and microalubmin Will discuss with Dr. Dayton Martes and see if she wants her to see an endocrinologist or not   Will call you regarding lab results and medication changes if needed

## 2014-06-27 NOTE — Assessment & Plan Note (Signed)
Well controlled on current therapy Will continue for now 

## 2014-06-27 NOTE — Progress Notes (Signed)
Pre visit review using our clinic review tool, if applicable. No additional management support is needed unless otherwise documented below in the visit note. 

## 2014-06-28 ENCOUNTER — Telehealth: Payer: Self-pay | Admitting: Family Medicine

## 2014-06-28 ENCOUNTER — Other Ambulatory Visit: Payer: Self-pay | Admitting: Internal Medicine

## 2014-06-28 DIAGNOSIS — E1165 Type 2 diabetes mellitus with hyperglycemia: Secondary | ICD-10-CM

## 2014-06-28 NOTE — Telephone Encounter (Signed)
Relevant patient education mailed to patient.  

## 2014-07-19 ENCOUNTER — Telehealth: Payer: Self-pay | Admitting: Family Medicine

## 2014-07-19 NOTE — Telephone Encounter (Signed)
Spoke to pt and advised per Dr Dayton Martes; pt verbally expressed understanding. F/u appt sched for 07/28/14

## 2014-07-19 NOTE — Telephone Encounter (Signed)
Decrease dose to once daily and make a follow up appt to see me in a few weeks.

## 2014-07-19 NOTE — Telephone Encounter (Signed)
Pt called stating fish oil 1000 mg 3x day is keeping her sick on stomach and legs cramping at night. Pt is very nauseated. What should pt do? Please advise  [phone]

## 2014-07-28 ENCOUNTER — Ambulatory Visit: Payer: BC Managed Care – PPO | Admitting: Family Medicine

## 2014-08-29 ENCOUNTER — Other Ambulatory Visit: Payer: Self-pay | Admitting: *Deleted

## 2014-08-29 MED ORDER — GLIPIZIDE 5 MG PO TABS: ORAL_TABLET | ORAL | Status: DC

## 2014-10-03 ENCOUNTER — Ambulatory Visit: Payer: BC Managed Care – PPO | Admitting: Family Medicine

## 2014-10-25 ENCOUNTER — Ambulatory Visit (INDEPENDENT_AMBULATORY_CARE_PROVIDER_SITE_OTHER): Payer: BC Managed Care – PPO

## 2014-10-25 DIAGNOSIS — Z23 Encounter for immunization: Secondary | ICD-10-CM

## 2014-10-26 ENCOUNTER — Telehealth: Payer: Self-pay | Admitting: *Deleted

## 2014-10-26 DIAGNOSIS — R7989 Other specified abnormal findings of blood chemistry: Secondary | ICD-10-CM

## 2014-10-26 DIAGNOSIS — R945 Abnormal results of liver function studies: Principal | ICD-10-CM

## 2014-10-26 NOTE — Telephone Encounter (Signed)
Received office note from dr Arrie Aran, enclosed is a copy of recent CMP. Liver function is elevated on this test. Per dr Jens Som the patient needs to stop the lovastatin if still taking. She will need f/u hepatic panel in 12 weeks. Unable to reach pt or leave a message

## 2014-11-01 NOTE — Telephone Encounter (Signed)
Spoke with pt, Aware of dr Ludwig Clarks recommendations.  Lab order placed to be drawn at the [address] office for patient convince.

## 2015-01-18 ENCOUNTER — Other Ambulatory Visit: Payer: Self-pay | Admitting: Physician Assistant

## 2015-01-22 ENCOUNTER — Other Ambulatory Visit (INDEPENDENT_AMBULATORY_CARE_PROVIDER_SITE_OTHER): Payer: BLUE CROSS/BLUE SHIELD | Admitting: *Deleted

## 2015-01-22 DIAGNOSIS — R7989 Other specified abnormal findings of blood chemistry: Secondary | ICD-10-CM

## 2015-01-22 DIAGNOSIS — R945 Abnormal results of liver function studies: Principal | ICD-10-CM

## 2015-01-22 LAB — HEPATIC FUNCTION PANEL
ALK PHOS: 112 U/L (ref 39–117)
ALT: 97 U/L — ABNORMAL HIGH (ref 0–35)
AST: 91 U/L — AB (ref 0–37)
Albumin: 3.5 g/dL (ref 3.5–5.2)
BILIRUBIN DIRECT: 0.2 mg/dL (ref 0.0–0.3)
Total Bilirubin: 0.6 mg/dL (ref 0.2–1.2)
Total Protein: 6.3 g/dL (ref 6.0–8.3)

## 2015-02-01 ENCOUNTER — Telehealth: Payer: Self-pay | Admitting: Family Medicine

## 2015-02-01 MED ORDER — CYCLOBENZAPRINE HCL 5 MG PO TABS: 5.0000 mg | ORAL_TABLET | Freq: Three times a day (TID) | ORAL | Status: DC | PRN

## 2015-02-01 NOTE — Telephone Encounter (Signed)
Pt is having back spasms again.  She wants to know if you can call in muscle relaxer for her again.  She does not want to come in to see anyone else.   Pt states her other doctor offices have sent you notes and you haven't contacted her.  She said her kidneys are not doing right and neither is her liver.   You mainly have same day appts left next week but let the front office know where you want [place] to fit her in.

## 2015-02-01 NOTE — Telephone Encounter (Signed)
Notified pt that her Flexeril was sent to her pharmacy.  Also set up an appt for her next Wednesday 2/24.  Explained to patient that she needs to routinely set up appts to follow up on her specialist visits.

## 2015-02-01 NOTE — Telephone Encounter (Signed)
Ok to put in her in a same day slot. I have not seen her since 03/2014.  Also, I have reviewed again the last note we received from her kidney specialist, Dr Arrie Aran, which was from 10/2014.  He did not ask for me to do anything in addition to what he was doing in that follow up visit so I am not sure what she is referring to.  We do not call to schedule follow up appointments after she sees specialists, unfortunately, she needs to do that herself. Flexeril rx refilled but needs to be seen if symptoms do not improve.

## 2015-02-07 ENCOUNTER — Ambulatory Visit (INDEPENDENT_AMBULATORY_CARE_PROVIDER_SITE_OTHER): Payer: BLUE CROSS/BLUE SHIELD | Admitting: Family Medicine

## 2015-02-07 ENCOUNTER — Encounter: Payer: Self-pay | Admitting: Family Medicine

## 2015-02-07 VITALS — BP 136/84 | HR 71 | Temp 97.8°F | Wt 243.0 lb

## 2015-02-07 DIAGNOSIS — M545 Low back pain: Secondary | ICD-10-CM

## 2015-02-07 DIAGNOSIS — E1165 Type 2 diabetes mellitus with hyperglycemia: Secondary | ICD-10-CM

## 2015-02-07 DIAGNOSIS — E785 Hyperlipidemia, unspecified: Secondary | ICD-10-CM

## 2015-02-07 DIAGNOSIS — N183 Chronic kidney disease, stage 3 unspecified: Secondary | ICD-10-CM

## 2015-02-07 DIAGNOSIS — R42 Dizziness and giddiness: Secondary | ICD-10-CM

## 2015-02-07 DIAGNOSIS — E1129 Type 2 diabetes mellitus with other diabetic kidney complication: Secondary | ICD-10-CM

## 2015-02-07 LAB — LIPID PANEL
Cholesterol: 229 mg/dL — ABNORMAL HIGH (ref 0–200)
HDL: 38.3 mg/dL — AB (ref >39.00)
NonHDL: 190.7
TRIGLYCERIDES: 253 mg/dL — AB (ref 0.0–149.0)
Total CHOL/HDL Ratio: 6
VLDL: 50.6 mg/dL — ABNORMAL HIGH (ref 0.0–40.0)

## 2015-02-07 LAB — COMPREHENSIVE METABOLIC PANEL
ALT: 115 U/L — AB (ref 0–35)
AST: 190 U/L — ABNORMAL HIGH (ref 0–37)
Albumin: 3.9 g/dL (ref 3.5–5.2)
Alkaline Phosphatase: 124 U/L — ABNORMAL HIGH (ref 39–117)
BUN: 38 mg/dL — AB (ref 6–23)
CALCIUM: 10.3 mg/dL (ref 8.4–10.5)
CHLORIDE: 100 meq/L (ref 96–112)
CO2: 31 meq/L (ref 19–32)
CREATININE: 1.37 mg/dL — AB (ref 0.40–1.20)
GFR: 41.27 mL/min — AB (ref >60.00)
Glucose, Bld: 85 mg/dL (ref 70–99)
POTASSIUM: 5 meq/L (ref 3.5–5.1)
Sodium: 138 mEq/L (ref 135–145)
Total Bilirubin: 0.6 mg/dL (ref 0.2–1.2)
Total Protein: 7.5 g/dL (ref 6.0–8.3)

## 2015-02-07 LAB — HEMOGLOBIN A1C: HEMOGLOBIN A1C: 8.7 % — AB (ref 4.6–6.5)

## 2015-02-07 LAB — LDL CHOLESTEROL, DIRECT: Direct LDL: 145 mg/dL

## 2015-02-07 MED ORDER — MECLIZINE HCL 25 MG PO TABS: 25.0000 mg | ORAL_TABLET | Freq: Three times a day (TID) | ORAL | Status: DC | PRN

## 2015-02-07 NOTE — Assessment & Plan Note (Signed)
Not well controlled. Followed by renal. Check a1c today- if remains poorly controlled, will refer again to endo. The patient indicates understanding of these issues and agrees with the plan.

## 2015-02-07 NOTE — Progress Notes (Signed)
Subjective:    Patient ID: Cassandra Fisher, female    DOB: [birth year], 63 y.o.   MRN: [id number]  HPI  64 yo female with h/o DM with renal manifestations, HTN, HLD,CAD,  gout, here to discuss:  1.  Muscle spasms- lower back.  Asked for flexeril which we sent in electronically to her pharmacy but I did ask for her to follow up if no improvement.  Per pt, it is getting better.  No symptoms of sciatica currently.  2.  Vertigo- h/o BPV in past- symptoms restarted a few days ago.  Cannot turn her head without getting dizzy and or nauseated.  Was vomiting but that has resolved.  3.  DM- not well controlled and due for labs and follow up. Lab Results  Component Value Date   HGBA1C 8.7* 06/27/2014    On Glipizide and NPH.  Referred her to endo but she never kept appt.  Current Outpatient Prescriptions on File Prior to Visit  Medication Sig Dispense Refill  . Acetaminophen (ARTHRITIS PAIN RELIEVER PO) Take 650 mg by mouth every 8 (eight) hours as needed.     Marland Kitchen acetaminophen (TYLENOL) 500 MG tablet Take 500 mg by mouth every 6 (six) hours as needed for mild pain or headache.    Marland Kitchen aspirin EC 325 MG tablet Take 325 mg by mouth daily.     . cholecalciferol (VITAMIN D) 1000 UNITS tablet Take 1,000 Units by mouth daily.     . Cholecalciferol (VITAMIN D) 400 UNITS capsule Take 400 Units by mouth daily.    . cyclobenzaprine (FLEXERIL) 5 MG tablet Take 1 tablet (5 mg total) by mouth 3 (three) times daily as needed for muscle spasms. 20 tablet 0  . enalapril (VASOTEC) 20 MG tablet TAKE ONE TABLET BY MOUTH ONCE DAILY 90 tablet 3  . febuxostat (ULORIC) 40 MG tablet Take 80 mg by mouth daily.    . furosemide (LASIX) 40 MG tablet Take 1 tablet (40 mg total) by mouth daily. 90 tablet 3  . glipiZIDE (GLUCOTROL) 5 MG tablet TAKE TWO TABLETS BY MOUTH TWICE DAILY BEFORE A MEAL 120 tablet 2  . insulin NPH (HUMULIN N,NOVOLIN N) 100 UNIT/ML injection Inject 82 Units into the skin 2 (two) times daily. 5 vial 3  .  metoprolol (LOPRESSOR) 50 MG tablet TAKE ONE & ONE-HALF TABLETS BY MOUTH TWICE DAILY 90 tablet 0  . Misc Natural Products (MIDNITE PM PO) Take by mouth. Sleep aid    . Multiple Vitamin (MULTIVITAMIN) capsule Take 1 capsule by mouth daily.      . nitroGLYCERIN (NITROSTAT) 0.4 MG SL tablet Place 1 tablet (0.4 mg total) under the tongue every 5 (five) minutes as needed for chest pain. 25 tablet 3  . traMADol (ULTRAM) 50 MG tablet Take 1 tablet (50 mg total) by mouth every 8 (eight) hours as needed. 30 tablet 0   No current facility-administered medications on file prior to visit.    Allergies  Allergen Reactions  . Colcrys [Colchicine] Other (See Comments)    Dizzy and fill like pass out  . Metformin And Related Swelling  . Pravastatin Swelling  . Sulfa Antibiotics Swelling  . Zocor [Simvastatin] Hives and Swelling    Past Medical History  Diagnosis Date  . CAD (coronary artery disease)   . Atrial fibrillation   . CHF (congestive heart failure)   . Hyperlipidemia   . HTN (hypertension)   . MI (myocardial infarction)   . DM (diabetes mellitus)   .  COPD (chronic obstructive pulmonary disease)   . Vertigo   . Anxiety   . Obesity   . Hx of CABG   . Gout     Past Surgical History  Procedure Laterality Date  . Coronary artery bypass graft  12/2001  . S/p dilatation and curettage      Family History  Problem Relation Age of Onset  . Hip fracture Mother     died at 8 following hip fx  . Diabetes Sister     History   Social History  . Marital Status: Married    Spouse Name: N/A  . Number of Children: N/A  . Years of Education: N/A   Occupational History  . Not on file.   Social History Main Topics  . Smoking status: Former Smoker    Types: Cigarettes    Quit date: 09/01/1991  . Smokeless tobacco: Not on file     Comment: has a 30 pack year history, quit in late 1990s  . Alcohol Use: No  . Drug Use: No  . Sexual Activity: Not on file   Other Topics Concern  .  Not on file   Social History Narrative   The PMH, PSH, Social History, Family History, Medications, and allergies have been reviewed in [facility], and have been updated if relevant.  Review of Systems  HENT: Negative.   Respiratory: Negative.   Cardiovascular: Negative.   Gastrointestinal: Positive for nausea.  Endocrine: Negative.   Genitourinary: Negative.   Musculoskeletal: Positive for back pain. Negative for myalgias, joint swelling, gait problem, neck pain and neck stiffness.  Skin: Negative.   Neurological: Positive for dizziness. Negative for tremors, seizures, syncope, speech difficulty, weakness, light-headedness, numbness and headaches.  Hematological: Negative.   Psychiatric/Behavioral: Negative.   All other systems reviewed and are negative.       Past Medical History  Diagnosis Date  . CAD (coronary artery disease)   . Atrial fibrillation   . CHF (congestive heart failure)   . Hyperlipidemia   . HTN (hypertension)   . MI (myocardial infarction)   . DM (diabetes mellitus)   . COPD (chronic obstructive pulmonary disease)   . Vertigo   . Anxiety   . Obesity   . Hx of CABG   . Gout     Current Outpatient Prescriptions  Medication Sig Dispense Refill  . Acetaminophen (ARTHRITIS PAIN RELIEVER PO) Take 650 mg by mouth every 8 (eight) hours as needed.     Marland Kitchen acetaminophen (TYLENOL) 500 MG tablet Take 500 mg by mouth every 6 (six) hours as needed for mild pain or headache.    Marland Kitchen aspirin EC 325 MG tablet Take 325 mg by mouth daily.     . cholecalciferol (VITAMIN D) 1000 UNITS tablet Take 1,000 Units by mouth daily.     . Cholecalciferol (VITAMIN D) 400 UNITS capsule Take 400 Units by mouth daily.    . cyclobenzaprine (FLEXERIL) 5 MG tablet Take 1 tablet (5 mg total) by mouth 3 (three) times daily as needed for muscle spasms. 20 tablet 0  . enalapril (VASOTEC) 20 MG tablet TAKE ONE TABLET BY MOUTH ONCE DAILY 90 tablet 3  . febuxostat (ULORIC) 40 MG tablet Take 80 mg by  mouth daily.    . furosemide (LASIX) 40 MG tablet Take 1 tablet (40 mg total) by mouth daily. 90 tablet 3  . glipiZIDE (GLUCOTROL) 5 MG tablet TAKE TWO TABLETS BY MOUTH TWICE DAILY BEFORE A MEAL 120 tablet 2  . insulin  NPH (HUMULIN N,NOVOLIN N) 100 UNIT/ML injection Inject 82 Units into the skin 2 (two) times daily. 5 vial 3  . metoprolol (LOPRESSOR) 50 MG tablet TAKE ONE & ONE-HALF TABLETS BY MOUTH TWICE DAILY 90 tablet 0  . Misc Natural Products (MIDNITE PM PO) Take by mouth. Sleep aid    . Multiple Vitamin (MULTIVITAMIN) capsule Take 1 capsule by mouth daily.      . nitroGLYCERIN (NITROSTAT) 0.4 MG SL tablet Place 1 tablet (0.4 mg total) under the tongue every 5 (five) minutes as needed for chest pain. 25 tablet 3  . traMADol (ULTRAM) 50 MG tablet Take 1 tablet (50 mg total) by mouth every 8 (eight) hours as needed. 30 tablet 0  . meclizine (ANTIVERT) 25 MG tablet Take 1 tablet (25 mg total) by mouth 3 (three) times daily as needed for dizziness. 30 tablet 0   No current facility-administered medications for this visit.    Allergies  Allergen Reactions  . Colcrys [Colchicine] Other (See Comments)    Dizzy and fill like pass out  . Metformin And Related Swelling  . Pravastatin Swelling  . Sulfa Antibiotics Swelling  . Zocor [Simvastatin] Hives and Swelling    Family History  Problem Relation Age of Onset  . Hip fracture Mother     died at 72 following hip fx  . Diabetes Sister     History   Social History  . Marital Status: Married    Spouse Name: N/A  . Number of Children: N/A  . Years of Education: N/A   Occupational History  . Not on file.   Social History Main Topics  . Smoking status: Former Smoker    Types: Cigarettes    Quit date: 09/01/1991  . Smokeless tobacco: Not on file     Comment: has a 30 pack year history, quit in late 1990s  . Alcohol Use: No  . Drug Use: No  . Sexual Activity: Not on file   Other Topics Concern  . Not on file   Social History  Narrative      Objective:   Physical Exam  BP 136/84 mmHg  Pulse 71  Temp(Src) 97.8 F (36.6 C) (Oral)  Wt 243 lb (110.224 kg)  SpO2 96% Wt Readings from Last 3 Encounters:  02/07/15 243 lb (110.224 kg)  06/27/14 252 lb (114.306 kg)  05/25/14 256 lb (116.121 kg)    General: Appears her stated age, obese but well developed, well nourished in NAD. HEENT: +dix hallpike, +/- nystagmus Skin: Warm, dry and intact. No rashes, lesions or ulcerations noted. Cardiovascular: Normal rate and rhythm. S1,S2 noted.  No murmur, rubs or gallops noted. No JVD or BLE edema. No carotid bruits noted. Pulmonary/Chest: Normal effort and positive vesicular breath sounds. No respiratory distress. No wheezes, rales or ronchi noted.   Neurological: Alert and oriented. Cranial nerves II-XII intact. Coordination normal. +DTRs bilaterally. Ext:  No edema MSK:  Palpable spasm- left lumbar paraspinous musculature Neg SLR bilaterally, normal gait    Assessment & Plan:

## 2015-02-07 NOTE — Assessment & Plan Note (Signed)
Palpable muscle spasm Continue prn flexeril. Exercises given. Call or return to clinic prn if these symptoms worsen or fail to improve as anticipated. The patient indicates understanding of these issues and agrees with the plan.

## 2015-02-07 NOTE — Assessment & Plan Note (Signed)
Deteriorated. eRx sent for meclizine. Also given handout for home vestibular rehab exercises. Call or return to clinic prn if these symptoms worsen or fail to improve as anticipated. The patient indicates understanding of these issues and agrees with the plan.

## 2015-02-07 NOTE — Progress Notes (Signed)
Pre visit review using our clinic review tool, if applicable. No additional management support is needed unless otherwise documented below in the visit note. 

## 2015-02-07 NOTE — Patient Instructions (Addendum)
Good to see you. We are starting meclizine as needed for your vertigo. Please call us with an update.

## 2015-02-08 ENCOUNTER — Other Ambulatory Visit: Payer: Self-pay | Admitting: Family Medicine

## 2015-02-08 DIAGNOSIS — E1165 Type 2 diabetes mellitus with hyperglycemia: Secondary | ICD-10-CM

## 2015-02-09 ENCOUNTER — Telehealth: Payer: Self-pay | Admitting: *Deleted

## 2015-02-09 ENCOUNTER — Other Ambulatory Visit: Payer: Self-pay | Admitting: Family Medicine

## 2015-02-09 DIAGNOSIS — R945 Abnormal results of liver function studies: Principal | ICD-10-CM

## 2015-02-09 DIAGNOSIS — R7989 Other specified abnormal findings of blood chemistry: Secondary | ICD-10-CM

## 2015-02-09 MED ORDER — GLIPIZIDE 5 MG PO TABS: ORAL_TABLET | ORAL | Status: DC

## 2015-02-09 NOTE — Addendum Note (Signed)
Addended by: Desmond Dike on: 02/09/2015 09:09 AM   Modules accepted: Orders

## 2015-02-09 NOTE — Telephone Encounter (Signed)
Spoke to pt and informed her of results. Pt states that she is not wanting to be seen by endo and is refusing referral. States that she is currently needing refill of glipizide; Rx sent to requested pharmacy

## 2015-02-12 ENCOUNTER — Encounter: Payer: Self-pay | Admitting: Physician Assistant

## 2015-02-12 ENCOUNTER — Telehealth: Payer: Self-pay | Admitting: Family Medicine

## 2015-02-12 NOTE — Telephone Encounter (Signed)
I would like for her to see GI at least one time as we may need to at least do an ultrasound of her liver and more blood work.

## 2015-02-12 NOTE — Telephone Encounter (Signed)
Thanks, she is scheduled 02/28/15 with [facility].

## 2015-02-12 NOTE — Telephone Encounter (Signed)
Pt is really confused as to why she needs the gastroenterology referral. She states her liver tests could have been elevated because of some medication. Pt states her gout doctor told her to stop taking colcrys and lovastatin and that she thought the plan was to wait a while after stopping those meds and re-check liver function then to see if the results changed. She has also added Garlique to her daily medicine to see if that helped. Pt did say she does not have a gi history (no colonoscopy). She stated if she does have to go she want to see Dr. Juanda Chance at [facility] (b/c that is her husbands dr). Please advise.

## 2015-02-28 ENCOUNTER — Ambulatory Visit: Payer: BLUE CROSS/BLUE SHIELD | Admitting: Physician Assistant

## 2015-03-02 ENCOUNTER — Other Ambulatory Visit: Payer: Self-pay

## 2015-03-02 MED ORDER — METOPROLOL TARTRATE 50 MG PO TABS
75.0000 mg | ORAL_TABLET | Freq: Two times a day (BID) | ORAL | Status: DC
Start: 2015-03-02 — End: 2015-06-06

## 2015-03-07 ENCOUNTER — Ambulatory Visit: Payer: BLUE CROSS/BLUE SHIELD | Admitting: Physician Assistant

## 2015-03-07 ENCOUNTER — Telehealth: Payer: Self-pay | Admitting: Physician Assistant

## 2015-03-07 NOTE — Telephone Encounter (Signed)
No charge. Please reschedule her.

## 2015-03-07 NOTE — Telephone Encounter (Signed)
Pt called this morning to cancel same day office visit.  States her back hurts and is hard to get around.  Advise on late cancellation fee.

## 2015-03-08 ENCOUNTER — Ambulatory Visit: Payer: BLUE CROSS/BLUE SHIELD | Admitting: Physician Assistant

## 2015-03-19 ENCOUNTER — Ambulatory Visit (INDEPENDENT_AMBULATORY_CARE_PROVIDER_SITE_OTHER): Payer: BLUE CROSS/BLUE SHIELD | Admitting: Physician Assistant

## 2015-03-19 ENCOUNTER — Encounter: Payer: Self-pay | Admitting: Physician Assistant

## 2015-03-19 ENCOUNTER — Other Ambulatory Visit (INDEPENDENT_AMBULATORY_CARE_PROVIDER_SITE_OTHER): Payer: BLUE CROSS/BLUE SHIELD

## 2015-03-19 VITALS — BP 120/60 | HR 70 | Ht 66.0 in | Wt 243.2 lb

## 2015-03-19 DIAGNOSIS — R748 Abnormal levels of other serum enzymes: Secondary | ICD-10-CM | POA: Diagnosis not present

## 2015-03-19 LAB — CBC WITH DIFFERENTIAL/PLATELET
Basophils Absolute: 0 10*3/uL (ref 0.0–0.1)
Basophils Relative: 0.4 % (ref 0.0–3.0)
EOS ABS: 0.3 10*3/uL (ref 0.0–0.7)
EOS PCT: 3.7 % (ref 0.0–5.0)
HCT: 39.5 % (ref 36.0–46.0)
Hemoglobin: 13.7 g/dL (ref 12.0–15.0)
LYMPHS ABS: 2.5 10*3/uL (ref 0.7–4.0)
LYMPHS PCT: 29.6 % (ref 12.0–46.0)
MCHC: 34.8 g/dL (ref 30.0–36.0)
MCV: 91.8 fl (ref 78.0–100.0)
Monocytes Absolute: 0.4 10*3/uL (ref 0.1–1.0)
Monocytes Relative: 5.1 % (ref 3.0–12.0)
NEUTROS PCT: 61.2 % (ref 43.0–77.0)
Neutro Abs: 5.2 10*3/uL (ref 1.4–7.7)
Platelets: 205 10*3/uL (ref 150.0–400.0)
RBC: 4.3 Mil/uL (ref 3.87–5.11)
RDW: 15.1 % (ref 11.5–15.5)
WBC: 8.5 10*3/uL (ref 4.0–10.5)

## 2015-03-19 LAB — TSH: TSH: 4.7 u[IU]/mL — AB (ref 0.35–4.50)

## 2015-03-19 LAB — AMYLASE: Amylase: 19 U/L — ABNORMAL LOW (ref 27–131)

## 2015-03-19 LAB — COMPREHENSIVE METABOLIC PANEL
ALBUMIN: 3.7 g/dL (ref 3.5–5.2)
ALK PHOS: 113 U/L (ref 39–117)
ALT: 109 U/L — ABNORMAL HIGH (ref 0–35)
AST: 170 U/L — ABNORMAL HIGH (ref 0–37)
BUN: 45 mg/dL — ABNORMAL HIGH (ref 6–23)
CO2: 28 meq/L (ref 19–32)
Calcium: 9.6 mg/dL (ref 8.4–10.5)
Chloride: 97 mEq/L (ref 96–112)
Creatinine, Ser: 1.61 mg/dL — ABNORMAL HIGH (ref 0.40–1.20)
GFR: 34.24 mL/min — AB (ref >60.00)
GLUCOSE: 126 mg/dL — AB (ref 70–99)
POTASSIUM: 4.6 meq/L (ref 3.5–5.1)
SODIUM: 133 meq/L — AB (ref 135–145)
TOTAL PROTEIN: 7.2 g/dL (ref 6.0–8.3)
Total Bilirubin: 0.6 mg/dL (ref 0.2–1.2)

## 2015-03-19 LAB — PROTIME-INR
INR: 1.1 ratio — AB (ref 0.8–1.0)
PROTHROMBIN TIME: 11.8 s (ref 9.6–13.1)

## 2015-03-19 LAB — AMMONIA: AMMONIA: 48 umol/L — AB (ref 11–35)

## 2015-03-19 LAB — IBC PANEL
Iron: 101 ug/dL (ref 42–145)
Saturation Ratios: 30.2 % (ref 20.0–50.0)
Transferrin: 239 mg/dL (ref 212.0–360.0)

## 2015-03-19 LAB — LIPASE: Lipase: 24 U/L (ref 11.0–59.0)

## 2015-03-19 LAB — SEDIMENTATION RATE: Sed Rate: 79 mm/hr — ABNORMAL HIGH (ref 0–22)

## 2015-03-19 LAB — C-REACTIVE PROTEIN: CRP: 4.4 mg/dL (ref 0.5–20.0)

## 2015-03-19 LAB — IGA: IgA: 263 mg/dL (ref 68–378)

## 2015-03-19 NOTE — Progress Notes (Signed)
Patient ID: Cassandra Fisher, female   DOB: 12/23/1950, 64 y.o.   MRN: [id number]    HPI:  Cassandra Fisher is a 64 y.o.   female  referred by Lucille Passy, MD for evaluation of abnormal LFTs.  Polyp has a history of diabetes with renal manifestations, hypertension, hyperlipidemia, coronary artery disease, atrial fibrillation, chronic systolic heart failure, carotid stenosis, cardiomyopathy, COPD, chronic kidney disease stage III, and gout. She was recently evaluated by her primary care provider and found to have abnormal LFTs. She denies complaints of abdominal pain, nausea, or vomiting. She has had no dark urine and denies clay-colored stools. She denies use of alcohol or intravenous drugs. She has not been on any new medications but her cold Gerald Stabs was stopped several weeks ago. She does have a history of a blood transfusion in 1973 after a miscarriage. She has a tattoo on the left chest that she had placed 20 years ago. She denies muscle weakness, fever, chills, or night sweats. She has no postprandial abdominal pain or nausea. She denies excess use of acetaminophen. She denies use of herbal supplements but states she occasionally uses a garlic supplement.   Past Medical History  Diagnosis Date  . CAD (coronary artery disease)   . Atrial fibrillation   . CHF (congestive heart failure)   . Hyperlipidemia   . HTN (hypertension)   . MI (myocardial infarction)   . DM (diabetes mellitus)   . COPD (chronic obstructive pulmonary disease)   . Vertigo   . Anxiety   . Obesity   . Hx of CABG   . Gout   . Arthritis   . Kidney stones     x 3    Past Surgical History  Procedure Laterality Date  . Coronary artery bypass graft  12/2001  . S/p dilatation and curettage    . Tubal ligation     Family History  Problem Relation Age of Onset  . Hip fracture Mother     died at 37 following hip fx  . Diabetes Sister   . Lung cancer Maternal Uncle     x 2  . Breast cancer Maternal Aunt   . Colon cancer  Neg Hx   . Crohn's disease Daughter   . Diabetes Maternal Grandmother   . Diabetes Mother     borderline  . Kidney disease Father     Bright's disease   History  Substance Use Topics  . Smoking status: Former Smoker    Types: Cigarettes    Quit date: 09/01/1991  . Smokeless tobacco: Never Used     Comment: has a 30 pack year history, quit in late 1990s  . Alcohol Use: No   Current Outpatient Prescriptions  Medication Sig Dispense Refill  . acetaminophen (TYLENOL) 500 MG tablet Take 500 mg by mouth every 6 (six) hours as needed for mild pain or headache.    Marland Kitchen aspirin EC 325 MG tablet Take 325 mg by mouth daily.     . cholecalciferol (VITAMIN D) 1000 UNITS tablet Take 1,000 Units by mouth daily.     . Cholecalciferol (VITAMIN D) 400 UNITS capsule Take 400 Units by mouth daily.    . cyclobenzaprine (FLEXERIL) 5 MG tablet Take 1 tablet (5 mg total) by mouth 3 (three) times daily as needed for muscle spasms. 20 tablet 0  . enalapril (VASOTEC) 20 MG tablet TAKE ONE TABLET BY MOUTH ONCE DAILY 90 tablet 3  . febuxostat (ULORIC) 40 MG tablet Take 80  mg by mouth daily.    . furosemide (LASIX) 40 MG tablet Take 1 tablet (40 mg total) by mouth daily. 90 tablet 3  . glipiZIDE (GLUCOTROL) 5 MG tablet TAKE TWO TABLETS BY MOUTH TWICE DAILY BEFORE A MEAL 120 tablet 2  . insulin NPH (HUMULIN N,NOVOLIN N) 100 UNIT/ML injection Inject 82 Units into the skin 2 (two) times daily. 5 vial 3  . meclizine (ANTIVERT) 25 MG tablet Take 1 tablet (25 mg total) by mouth 3 (three) times daily as needed for dizziness. 30 tablet 0  . metoprolol (LOPRESSOR) 50 MG tablet Take 1.5 tablets (75 mg total) by mouth 2 (two) times daily. 90 tablet 2  . Misc Natural Products (MIDNITE PM PO) Take by mouth. Sleep aid    . Multiple Vitamin (MULTIVITAMIN) capsule Take 1 capsule by mouth daily.      . nitroGLYCERIN (NITROSTAT) 0.4 MG SL tablet Place 1 tablet (0.4 mg total) under the tongue every 5 (five) minutes as needed for chest  pain. 25 tablet 3  . traMADol (ULTRAM) 50 MG tablet Take 1 tablet (50 mg total) by mouth every 8 (eight) hours as needed. 30 tablet 0   No current facility-administered medications for this visit.   Allergies  Allergen Reactions  . Colcrys [Colchicine] Other (See Comments)    Dizzy and fill like pass out  . Metformin And Related Swelling  . Pravastatin Swelling  . Sulfa Antibiotics Swelling  . Zocor [Simvastatin] Hives and Swelling     Review of Systems: Gen: Denies any fever, chills, sweats, anorexia, fatigue, weakness, malaise, weight loss, and sleep disorder CV: Denies chest pain, angina, palpitations, syncope, orthopnea, PND, peripheral edema, and claudication. Resp: Denies dyspnea at rest, dyspnea with exercise, cough, sputum, wheezing, coughing up blood, and pleurisy. GI: Denies vomiting blood, jaundice, and fecal incontinence.   Denies dysphagia or odynophagia. GU : Denies urinary burning, blood in urine, urinary frequency, urinary hesitancy, nocturnal urination, and urinary incontinence. CH:ENIDPOEU for back pain Derm: Denies rash, itching, dry skin, hives, moles, warts, or unhealing ulcers.  Psych: Denies depression, anxiety, memory loss, suicidal ideation, hallucinations, paranoia, and confusion. Heme: Denies bruising, bleeding, and enlarged lymph nodes. Neuro: Positive for dizziness Endo:  Denies any problems with thyroid, adrenal function  Labs: Blood work on 02/07/2015 revealed an alkaline phosphatase of 124, albumin 3.9, AST 190, ALT 1:15, total bili 0.6. GFR 41, BUN 38, creatinine 1.37. Hemoglobin A1c 8.7.   Physical Exam: BP 120/60 mmHg  Pulse 70  Ht 5' 6"  (1.676 m)  Wt 243 lb 3.2 oz (110.315 kg)  BMI 39.27 kg/m2 Constitutional: Pleasant,heavyset female in no acute distress. HEENT: Normocephalic and atraumatic. Conjunctivae are normal. No scleral icterus. Neck supple. No cervical adenopathy Cardiovascular: Normal rate, regular rhythm.  Pulmonary/chest:  Effort normal and breath sounds normal. No wheezing, rales or rhonchi. Abdominal: Soft, nondistended, nontender. Bowel sounds active throughout. There are no masses palpable. No hepatomegaly. Extremities: no edema Lymphadenopathy: No cervical adenopathy noted. Neurological: Alert and oriented to person place and time. Skin: Skin is warm and dry. No rashes noted. Psychiatric: Normal mood and affect. Behavior is normal.  ASSESSMENT AND PLAN:  64 year old female referred for evaluation of abnormal LFTs. An abdominal ultrasound will be obtained for further evaluation of the liver and biliary tree. A CBC, comprehensive metabolic panel, amylase, lipase, TSH, ESR, CRP, iron panel, IgA, TTG, ammonia, PT INR, AFP, alpha-1 antitrypsin, mitochondrial antibody, ANA, anti-smooth muscle antibody, ceruloplasmin, hep B surface antibody, hep B surface antigen, hep C antibody,  hep A antibody total be obtained to help further delineate the etiology of her abnormal LFTs it was explained to the patient that she likely has some degree of fatty liver and tighter glycemic control is necessary. If her antinuclear antibody or anti-smooth muscle antibody are positive she may be a candidate for a liver biopsy. Further recommendations will be made pending the findings of the above testing.   Shelbi Vaccaro, Vita Barley PA-C 03/19/2015, 5:25 PM  CC: Lucille Passy, MD

## 2015-03-19 NOTE — Patient Instructions (Signed)
Your physician has requested that you go to the basement for lab work before leaving today.   You have been scheduled for an abdominal ultrasound at [facility] (1st floor of hospital) on April 13th  at 11:00am. Please arrive 15 minutes prior to your appointment for registration. Make certain not to have anything to eat or drink 6 hours prior to your appointment. Should you need to reschedule your appointment, please contact radiology at [phone]. This test typically takes about 30 minutes to perform.   I appreciate the opportunity to care for you.

## 2015-03-20 LAB — HEPATITIS B SURFACE ANTIGEN: HEP B S AG: NEGATIVE

## 2015-03-20 LAB — HEPATITIS C ANTIBODY: HCV Ab: NEGATIVE

## 2015-03-20 LAB — ANA: Anti Nuclear Antibody(ANA): NEGATIVE

## 2015-03-20 LAB — HEPATITIS A ANTIBODY, TOTAL: Hep A Total Ab: NONREACTIVE

## 2015-03-20 LAB — AFP TUMOR MARKER: AFP TUMOR MARKER: 2.5 ng/mL (ref <6.1)

## 2015-03-20 LAB — HEPATITIS B SURFACE ANTIBODY,QUALITATIVE: HEP B S AB: NEGATIVE

## 2015-03-20 LAB — ANTI-SMOOTH MUSCLE ANTIBODY, IGG: SMOOTH MUSCLE AB: 9 U (ref <20)

## 2015-03-20 NOTE — Progress Notes (Signed)
i agree with the above note, plan 

## 2015-03-21 LAB — ALPHA-1-ANTITRYPSIN: A-1 Antitrypsin, Ser: 192 mg/dL (ref 83–199)

## 2015-03-21 LAB — MITOCHONDRIAL ANTIBODIES: Mitochondrial M2 Ab, IgG: 0.39 (ref <0.91)

## 2015-03-21 LAB — CERULOPLASMIN: Ceruloplasmin: 34 mg/dL (ref 18–53)

## 2015-03-21 LAB — TISSUE TRANSGLUTAMINASE, IGA: Tissue Transglutaminase Ab, IgA: 1 U/mL (ref <4)

## 2015-03-26 ENCOUNTER — Telehealth: Payer: Self-pay | Admitting: Physician Assistant

## 2015-03-26 NOTE — Telephone Encounter (Signed)
Spoke with patient and she is asking for lab results. She also states she cancelled the ultrasound because they want $748 up front and she cannot afford this.

## 2015-03-28 ENCOUNTER — Ambulatory Visit (HOSPITAL_COMMUNITY): Payer: BLUE CROSS/BLUE SHIELD

## 2015-03-29 NOTE — Telephone Encounter (Signed)
Does she have a f/u appt?

## 2015-03-30 NOTE — Telephone Encounter (Signed)
Is there any program at any of the imaging places to help her with the cost of the ultrasound?

## 2015-03-30 NOTE — Telephone Encounter (Signed)
No follow-up OV scheduled. When would you like her to follow-up?

## 2015-04-02 ENCOUNTER — Other Ambulatory Visit: Payer: Self-pay | Admitting: *Deleted

## 2015-04-02 DIAGNOSIS — R748 Abnormal levels of other serum enzymes: Secondary | ICD-10-CM

## 2015-04-02 MED ORDER — HEPATITIS A-HEP B RECOMB VAC 720-20 ELU-MCG/ML IM SUSP: 1.0000 mL | Freq: Once | INTRAMUSCULAR | Status: AC

## 2015-04-02 NOTE — Telephone Encounter (Signed)
Cassandra Fisher, this patient needs a follow up OV with Dr. Christella Hartigan. Please, help.

## 2015-04-02 NOTE — Telephone Encounter (Signed)
Well, she can sched her f/u with attendings next available.

## 2015-04-02 NOTE — Telephone Encounter (Signed)
Pt has been notified that she needs a follow up with Dr Christella Hartigan.  She would like to set that up when she comes for her Hep B injections

## 2015-04-02 NOTE — Telephone Encounter (Signed)
She is insured and this is deductible. No program available.

## 2015-04-09 ENCOUNTER — Encounter: Payer: BLUE CROSS/BLUE SHIELD | Admitting: Gastroenterology

## 2015-04-12 ENCOUNTER — Other Ambulatory Visit: Payer: Self-pay | Admitting: Cardiology

## 2015-04-12 DIAGNOSIS — R0989 Other specified symptoms and signs involving the circulatory and respiratory systems: Secondary | ICD-10-CM

## 2015-04-16 ENCOUNTER — Ambulatory Visit (HOSPITAL_COMMUNITY): Payer: BLUE CROSS/BLUE SHIELD | Attending: Cardiology

## 2015-04-16 DIAGNOSIS — I6523 Occlusion and stenosis of bilateral carotid arteries: Secondary | ICD-10-CM | POA: Diagnosis not present

## 2015-04-16 DIAGNOSIS — R0989 Other specified symptoms and signs involving the circulatory and respiratory systems: Secondary | ICD-10-CM

## 2015-05-04 ENCOUNTER — Other Ambulatory Visit: Payer: Self-pay | Admitting: Physician Assistant

## 2015-05-04 NOTE — Telephone Encounter (Signed)
Per note 6.11.15  

## 2015-05-09 ENCOUNTER — Other Ambulatory Visit: Payer: Self-pay

## 2015-05-31 ENCOUNTER — Other Ambulatory Visit: Payer: Self-pay | Admitting: Physician Assistant

## 2015-06-06 ENCOUNTER — Telehealth: Payer: Self-pay

## 2015-06-06 ENCOUNTER — Telehealth: Payer: Self-pay | Admitting: Cardiology

## 2015-06-06 MED ORDER — METOPROLOL TARTRATE 50 MG PO TABS: 75.0000 mg | ORAL_TABLET | Freq: Two times a day (BID) | ORAL | Status: DC

## 2015-06-06 NOTE — Telephone Encounter (Signed)
°  1. Which medications need to be refilled? Metoprolol  2. Which pharmacy is medication to be sent to?[facility] on [address] in [place]   3. Do they need a 30 day or 90 day supply? 90  4. Would they like a call back once the medication has been sent to the pharmacy? Yes because she has 2 pills left

## 2015-06-06 NOTE — Telephone Encounter (Signed)
Returned call to patient metoprolol refill sent to pharmacy.Advised to keep appointment with Dr.Crenshaw 08/10/15 at 9:00 am.

## 2015-06-06 NOTE — Telephone Encounter (Signed)
Diabetic Bundle. I tried to contact the pt. Phone number would not connect.

## 2015-06-29 ENCOUNTER — Other Ambulatory Visit: Payer: Self-pay | Admitting: *Deleted

## 2015-06-29 MED ORDER — CYCLOBENZAPRINE HCL 5 MG PO TABS: 5.0000 mg | ORAL_TABLET | Freq: Three times a day (TID) | ORAL | Status: DC | PRN

## 2015-06-29 NOTE — Telephone Encounter (Signed)
Last f/u and Rx 01/2015. pls advise

## 2015-07-02 ENCOUNTER — Other Ambulatory Visit: Payer: Self-pay | Admitting: *Deleted

## 2015-07-02 MED ORDER — METOPROLOL TARTRATE 50 MG PO TABS: 75.0000 mg | ORAL_TABLET | Freq: Two times a day (BID) | ORAL | Status: DC

## 2015-08-03 ENCOUNTER — Other Ambulatory Visit: Payer: Self-pay | Admitting: Cardiology

## 2015-08-03 ENCOUNTER — Other Ambulatory Visit: Payer: Self-pay | Admitting: Family Medicine

## 2015-08-03 ENCOUNTER — Other Ambulatory Visit: Payer: Self-pay | Admitting: *Deleted

## 2015-08-03 MED ORDER — FUROSEMIDE 40 MG PO TABS: 40.0000 mg | ORAL_TABLET | Freq: Every day | ORAL | Status: DC

## 2015-08-03 NOTE — Telephone Encounter (Signed)
REFILL 

## 2015-08-07 NOTE — Progress Notes (Signed)
HPI: FU hx of CAD status post coronary bypass graft and atrial fibrillation. Patient was admitted in Nov 2011 with atrial fibrillation with a rapid ventricular response. Troponin mildly increased. TSH normal. Echocardiogram in November of 2011 showed an ejection fraction of 45-50% with mild mitral regurgitation. Cardiac catheterization in Nov 2011 revealed an ejection fraction of 50%, severe three-vessel coronary artery disease status post 6-vessel coronary artery bypass graft with 6 patent bypass grafts. Severe disease in the proximal portion of the previously stented circumflex vessel that is unchanged from films in 2008 but is unprotected by a vein graft. Medical therapy was recommended. The patient previously declined Coumadin because of financial issues.   Since last seen, patient denies dyspnea, chest pain, palpitations or syncope.   Studies: - LHC (10/2010): 6 bypass grafts patent,Severe disease in the proximal portion of the previously stented circumflex vessel that is unchanged from films in 2008 but is unprotected by a vein graft. Medical therapy was recommended.  - Echo (10/2010): EF 45% to 50%. Diffuse hypokinesis. Mitral valve: Mild regurgitation. - Carotid US (5/16): 40-59% bilateral - repeat 1 year  Current Outpatient Prescriptions  Medication Sig Dispense Refill  . acetaminophen (TYLENOL) 500 MG tablet Take 500 mg by mouth every 6 (six) hours as needed for mild pain or headache.    Marland Kitchen aspirin EC 325 MG tablet Take 325 mg by mouth daily.     . cholecalciferol (VITAMIN D) 1000 UNITS tablet Take 1,000 Units by mouth daily.     . Cholecalciferol (VITAMIN D) 400 UNITS capsule Take 400 Units by mouth daily.    . cyclobenzaprine (FLEXERIL) 5 MG tablet Take 1 tablet (5 mg total) by mouth 3 (three) times daily as needed for muscle spasms. 20 tablet 0  . enalapril (VASOTEC) 20 MG tablet TAKE ONE TABLET BY MOUTH ONCE DAILY 90 tablet 0  . febuxostat (ULORIC) 40 MG tablet Take 40  mg by mouth daily.     . furosemide (LASIX) 40 MG tablet Take 1 tablet (40 mg total) by mouth daily. 30 tablet 0  . glipiZIDE (GLUCOTROL) 5 MG tablet TAKE TWO TABLETS BY MOUTH TWICE DAILY BEFORE MEAL(S) 120 tablet 0  . insulin NPH (HUMULIN N,NOVOLIN N) 100 UNIT/ML injection Inject 82 Units into the skin 2 (two) times daily. 5 vial 3  . meclizine (ANTIVERT) 25 MG tablet Take 1 tablet (25 mg total) by mouth 3 (three) times daily as needed for dizziness. 30 tablet 0  . metoprolol (LOPRESSOR) 50 MG tablet TAKE ONE & ONE-HALF TABLETS BY MOUTH TWICE DAILY 90 tablet 0  . Misc Natural Products (MIDNITE PM PO) Take by mouth. Sleep aid    . Multiple Vitamin (MULTIVITAMIN) capsule Take 1 capsule by mouth daily.      . nitroGLYCERIN (NITROSTAT) 0.4 MG SL tablet Place 1 tablet (0.4 mg total) under the tongue every 5 (five) minutes as needed for chest pain. 25 tablet 3  . traMADol (ULTRAM) 50 MG tablet Take 1 tablet (50 mg total) by mouth every 8 (eight) hours as needed. 30 tablet 0   Current Facility-Administered Medications  Medication Dose Route Frequency Provider Last Rate Last Dose  . hepatitis A-hepatitis B (TWINRIX) injection 1 mL  1 mL Intramuscular Once Lori P Hvozdovic, PA-C         Past Medical History  Diagnosis Date  . CAD (coronary artery disease)   . Atrial fibrillation   . CHF (congestive heart failure)   . Hyperlipidemia   .  HTN (hypertension)   . MI (myocardial infarction)   . DM (diabetes mellitus)   . COPD (chronic obstructive pulmonary disease)   . Vertigo   . Anxiety   . Obesity   . Hx of CABG   . Gout   . Arthritis   . Kidney stones     x 3    Past Surgical History  Procedure Laterality Date  . Coronary artery bypass graft  12/2001  . S/p dilatation and curettage    . Tubal ligation      Social History   Social History  . Marital Status: Married    Spouse Name: N/A  . Number of Children: 4  . Years of Education: N/A   Occupational History  . Not on file.     Social History Main Topics  . Smoking status: Former Smoker    Types: Cigarettes    Quit date: 09/01/1991  . Smokeless tobacco: Never Used     Comment: has a 30 pack year history, quit in late 1990s  . Alcohol Use: No  . Drug Use: No  . Sexual Activity: Not on file   Other Topics Concern  . Not on file   Social History Narrative    ROS: no fevers or chills, productive cough, hemoptysis, dysphasia, odynophagia, melena, hematochezia, dysuria, hematuria, rash, seizure activity, orthopnea, PND, pedal edema, claudication. Remaining systems are negative.  Physical Exam: Well-developed obese in no acute distress.  Skin is warm and dry.  HEENT is normal.  Neck is supple.  Chest is clear to auscultation with normal expansion.  Cardiovascular exam is regular rate and rhythm.  Abdominal exam nontender or distended. No masses palpated. Extremities show no edema. neuro grossly intact  ECG sinus rhythm at a rate of 63. IVCD, septal infarct, inferior lateral T-wave inversion.

## 2015-08-10 ENCOUNTER — Encounter: Payer: Self-pay | Admitting: Cardiology

## 2015-08-10 ENCOUNTER — Ambulatory Visit (INDEPENDENT_AMBULATORY_CARE_PROVIDER_SITE_OTHER): Payer: BLUE CROSS/BLUE SHIELD | Admitting: Cardiology

## 2015-08-10 VITALS — BP 130/60 | HR 63 | Ht 66.0 in | Wt 244.5 lb

## 2015-08-10 DIAGNOSIS — I679 Cerebrovascular disease, unspecified: Secondary | ICD-10-CM

## 2015-08-10 DIAGNOSIS — N183 Chronic kidney disease, stage 3 unspecified: Secondary | ICD-10-CM

## 2015-08-10 DIAGNOSIS — I429 Cardiomyopathy, unspecified: Secondary | ICD-10-CM

## 2015-08-10 DIAGNOSIS — I10 Essential (primary) hypertension: Secondary | ICD-10-CM | POA: Diagnosis not present

## 2015-08-10 DIAGNOSIS — E669 Obesity, unspecified: Secondary | ICD-10-CM

## 2015-08-10 DIAGNOSIS — E785 Hyperlipidemia, unspecified: Secondary | ICD-10-CM | POA: Diagnosis not present

## 2015-08-10 DIAGNOSIS — I4891 Unspecified atrial fibrillation: Secondary | ICD-10-CM

## 2015-08-10 DIAGNOSIS — I2510 Atherosclerotic heart disease of native coronary artery without angina pectoris: Secondary | ICD-10-CM

## 2015-08-10 LAB — HEPATIC FUNCTION PANEL
ALBUMIN: 3.8 g/dL (ref 3.6–5.1)
ALT: 51 U/L — AB (ref 6–29)
AST: 72 U/L — ABNORMAL HIGH (ref 10–35)
Alkaline Phosphatase: 116 U/L (ref 33–130)
Bilirubin, Direct: 0.2 mg/dL (ref <=0.2)
Indirect Bilirubin: 0.4 mg/dL (ref 0.2–1.2)
TOTAL PROTEIN: 6.9 g/dL (ref 6.1–8.1)
Total Bilirubin: 0.6 mg/dL (ref 0.2–1.2)

## 2015-08-10 NOTE — Assessment & Plan Note (Signed)
Blood pressure controlled. Continue present medications. 

## 2015-08-10 NOTE — Patient Instructions (Signed)
Your physician wants you to follow-up in: ONE YEAR WITH DR CRENSHAW You will receive a reminder letter in the mail two months in advance. If you don't receive a letter, please call our office to schedule the follow-up appointment.   Your physician recommends that you HAVE LAB WORK TODAY 

## 2015-08-10 NOTE — Assessment & Plan Note (Signed)
Continue aspirin 

## 2015-08-10 NOTE — Assessment & Plan Note (Signed)
Patient has previously declined anticoagulation and understands the higher risk of CVA. Presently in sinus rhythm. Continue metoprolol.

## 2015-08-10 NOTE — Assessment & Plan Note (Signed)
Followed by nephrology. 

## 2015-08-10 NOTE — Assessment & Plan Note (Signed)
Continue ACE inhibitor and beta blocker. 

## 2015-08-10 NOTE — Assessment & Plan Note (Signed)
Needs weight loss. 

## 2015-08-10 NOTE — Assessment & Plan Note (Signed)
Patient not on a statin. She had elevation of liver functions recently that she states was attributed to gout medication. I will recheck liver functions today. If they have normalized we will try low-dose Lipitor to see if she will tolerate particularly given history of coronary disease.

## 2015-08-10 NOTE — Assessment & Plan Note (Signed)
Follow-up carotid Dopplers May 2017. 

## 2015-08-13 ENCOUNTER — Telehealth: Payer: Self-pay

## 2015-08-13 NOTE — Telephone Encounter (Signed)
Called patient to notify her of being due for a Mammogram. Patient stated that she normally goes to [facility], and that she would schedule one when soon.

## 2015-09-04 ENCOUNTER — Other Ambulatory Visit: Payer: Self-pay | Admitting: Physician Assistant

## 2015-09-04 ENCOUNTER — Other Ambulatory Visit: Payer: Self-pay | Admitting: Cardiology

## 2015-10-04 ENCOUNTER — Other Ambulatory Visit: Payer: Self-pay | Admitting: Family Medicine

## 2015-10-04 NOTE — Telephone Encounter (Signed)
Last a1c abnormal at 8.7

## 2015-10-04 NOTE — Telephone Encounter (Signed)
Ok to refill rx s as requested.  Needs OV

## 2015-10-04 NOTE — Telephone Encounter (Signed)
Spoke to pt and f/u appt scheduled.  

## 2015-10-10 ENCOUNTER — Ambulatory Visit (INDEPENDENT_AMBULATORY_CARE_PROVIDER_SITE_OTHER): Payer: BLUE CROSS/BLUE SHIELD | Admitting: Physician Assistant

## 2015-10-10 ENCOUNTER — Other Ambulatory Visit: Payer: Self-pay | Admitting: *Deleted

## 2015-10-10 DIAGNOSIS — R7989 Other specified abnormal findings of blood chemistry: Secondary | ICD-10-CM

## 2015-10-10 DIAGNOSIS — Z23 Encounter for immunization: Secondary | ICD-10-CM

## 2015-10-10 DIAGNOSIS — R945 Abnormal results of liver function studies: Principal | ICD-10-CM

## 2015-10-11 ENCOUNTER — Ambulatory Visit (INDEPENDENT_AMBULATORY_CARE_PROVIDER_SITE_OTHER): Payer: BLUE CROSS/BLUE SHIELD | Admitting: Family Medicine

## 2015-10-11 ENCOUNTER — Encounter: Payer: Self-pay | Admitting: Family Medicine

## 2015-10-11 VITALS — BP 134/82 | HR 77 | Temp 97.4°F | Wt 246.5 lb

## 2015-10-11 DIAGNOSIS — I2510 Atherosclerotic heart disease of native coronary artery without angina pectoris: Secondary | ICD-10-CM

## 2015-10-11 DIAGNOSIS — Z23 Encounter for immunization: Secondary | ICD-10-CM

## 2015-10-11 DIAGNOSIS — N183 Chronic kidney disease, stage 3 unspecified: Secondary | ICD-10-CM

## 2015-10-11 DIAGNOSIS — E785 Hyperlipidemia, unspecified: Secondary | ICD-10-CM | POA: Diagnosis not present

## 2015-10-11 DIAGNOSIS — E1129 Type 2 diabetes mellitus with other diabetic kidney complication: Secondary | ICD-10-CM | POA: Diagnosis not present

## 2015-10-11 DIAGNOSIS — E1165 Type 2 diabetes mellitus with hyperglycemia: Secondary | ICD-10-CM | POA: Diagnosis not present

## 2015-10-11 DIAGNOSIS — I10 Essential (primary) hypertension: Secondary | ICD-10-CM

## 2015-10-11 LAB — LIPID PANEL
CHOL/HDL RATIO: 5
Cholesterol: 207 mg/dL — ABNORMAL HIGH (ref 0–200)
HDL: 37.8 mg/dL — AB (ref >39.00)
LDL CALC: 131 mg/dL — AB (ref 0–99)
NONHDL: 168.78
TRIGLYCERIDES: 190 mg/dL — AB (ref 0.0–149.0)
VLDL: 38 mg/dL (ref 0.0–40.0)

## 2015-10-11 LAB — COMPREHENSIVE METABOLIC PANEL
ALT: 39 U/L — AB (ref 0–35)
AST: 58 U/L — AB (ref 0–37)
Albumin: 3.6 g/dL (ref 3.5–5.2)
Alkaline Phosphatase: 119 U/L — ABNORMAL HIGH (ref 39–117)
BILIRUBIN TOTAL: 0.6 mg/dL (ref 0.2–1.2)
BUN: 42 mg/dL — ABNORMAL HIGH (ref 6–23)
CHLORIDE: 98 meq/L (ref 96–112)
CO2: 34 meq/L — AB (ref 19–32)
CREATININE: 1.54 mg/dL — AB (ref 0.40–1.20)
Calcium: 9.9 mg/dL (ref 8.4–10.5)
GFR: 35.98 mL/min — ABNORMAL LOW (ref >60.00)
GLUCOSE: 92 mg/dL (ref 70–99)
Potassium: 4.9 mEq/L (ref 3.5–5.1)
Sodium: 137 mEq/L (ref 135–145)
Total Protein: 7.4 g/dL (ref 6.0–8.3)

## 2015-10-11 LAB — MICROALBUMIN / CREATININE URINE RATIO
CREATININE, U: 33.3 mg/dL
MICROALB/CREAT RATIO: 2.1 mg/g (ref 0.0–30.0)
Microalb, Ur: <0.7 mg/dL (ref 0.0–1.9)

## 2015-10-11 LAB — HEMOGLOBIN A1C: Hgb A1c MFr Bld: 8.5 % — ABNORMAL HIGH (ref 4.6–6.5)

## 2015-10-11 NOTE — Assessment & Plan Note (Signed)
States she is willing to see endo "if you make an appointment when my husband isnt' getting shots." Overdue for labs, urine micro. Orders Placed This Encounter  Procedures  . Flu Vaccine QUAD 36+ mos PF IM (Fluarix & Fluzone Quad PF)  . Comprehensive metabolic panel  . Lipid panel  . Hemoglobin A1c  . Microalbumin / creatinine urine ratio  . Ambulatory referral to Endocrinology

## 2015-10-11 NOTE — Assessment & Plan Note (Signed)
Followed by Dr. Mack Hook renal function today.

## 2015-10-11 NOTE — Progress Notes (Signed)
Pre visit review using our clinic review tool, if applicable. No additional management support is needed unless otherwise documented below in the visit note. 

## 2015-10-11 NOTE — Assessment & Plan Note (Signed)
Continue current rx. Recheck lab today.

## 2015-10-11 NOTE — Patient Instructions (Signed)
Good to see you. Check with your insurance to see if they will cover the shingles shot.  We will call you with your lab results.   

## 2015-10-11 NOTE — Assessment & Plan Note (Signed)
At goal for a diabetic. No changes made to rxs today. 

## 2015-10-11 NOTE — Progress Notes (Signed)
Subjective:    Patient ID: Cassandra Fisher, female    DOB: 01-27-1951, 64 y.o.   MRN: [id number]  HPI  Pleasant 64 yo female here to follow up chronic medical conditions.   HTN: Well controlled on Enalapril, Lasix and Lopressor. Denies HA, blurred vision, CP or SOB.  CKD III- followed by renal, Dr. Arrie Aran.  Was last seen on 10/20/14- note reviewed and scanned in Epic. Lab Results  Component Value Date   CREATININE 1.61* 03/19/2015     HLD: Denies myalgias on Lovastatin.  Lab Results  Component Value Date   CHOL 229* 02/07/2015   HDL 38.30* 02/07/2015   LDLCALC 65 06/27/2014   LDLDIRECT 145.0 02/07/2015   TRIG 253.0* 02/07/2015   CHOLHDL 6 02/07/2015   Lab Results  Component Value Date   ALT 51* 08/10/2015   AST 72* 08/10/2015   ALKPHOS 116 08/10/2015   BILITOT 0.6 08/10/2015    DM2:  Lab Results  Component Value Date   HGBA1C 8.7* 02/07/2015   On Glipizide and NPH- 82 units twice daily. She was referred to endocrinology by me over a year ago.The patient had that referral cancelled, saying that she wanted to go to a different specialist than the one we had referred her to. She never made an appointment with any other endocrinologist. Checks FSBS twice daily- ranging 80s-90s fasting.  Current Outpatient Prescriptions on File Prior to Visit  Medication Sig Dispense Refill  . acetaminophen (TYLENOL) 500 MG tablet Take 500 mg by mouth every 6 (six) hours as needed for mild pain or headache.    Marland Kitchen aspirin EC 325 MG tablet Take 325 mg by mouth daily.     . cholecalciferol (VITAMIN D) 1000 UNITS tablet Take 1,000 Units by mouth daily.     . cyclobenzaprine (FLEXERIL) 5 MG tablet TAKE ONE TABLET BY MOUTH THREE TIMES DAILY AS NEEDED FOR MUSCLE SPASM 20 tablet 0  . enalapril (VASOTEC) 20 MG tablet TAKE ONE TABLET BY MOUTH ONCE DAILY 90 tablet 3  . febuxostat (ULORIC) 40 MG tablet Take 40 mg by mouth daily.     . furosemide (LASIX) 40 MG tablet Take 1 tablet (40 mg total)  by mouth daily. 30 tablet 6  . glipiZIDE (GLUCOTROL) 5 MG tablet TAKE TWO TABLETS BY MOUTH TWICE DAILY BEFORE MEAL(S) 120 tablet 0  . insulin NPH (HUMULIN N,NOVOLIN N) 100 UNIT/ML injection Inject 82 Units into the skin 2 (two) times daily. 5 vial 3  . meclizine (ANTIVERT) 25 MG tablet Take 1 tablet (25 mg total) by mouth 3 (three) times daily as needed for dizziness. 30 tablet 0  . metoprolol (LOPRESSOR) 50 MG tablet TAKE ONE & ONE-HALF TABLETS BY MOUTH TWICE DAILY 90 tablet 6  . Misc Natural Products (MIDNITE PM PO) Take by mouth. Sleep aid    . Multiple Vitamin (MULTIVITAMIN) capsule Take 1 capsule by mouth daily.      . nitroGLYCERIN (NITROSTAT) 0.4 MG SL tablet Place 1 tablet (0.4 mg total) under the tongue every 5 (five) minutes as needed for chest pain. 25 tablet 3  . traMADol (ULTRAM) 50 MG tablet Take 1 tablet (50 mg total) by mouth every 8 (eight) hours as needed. 30 tablet 0   Current Facility-Administered Medications on File Prior to Visit  Medication Dose Route Frequency Provider Last Rate Last Dose  . hepatitis A-hepatitis B (TWINRIX) injection 1 mL  1 mL Intramuscular Once Lori P Hvozdovic, PA-C        Allergies  Allergen Reactions  . Colcrys [Colchicine] Other (See Comments)    Dizzy and fill like pass out  . Metformin And Related Swelling  . Pravastatin Swelling  . Sulfa Antibiotics Swelling  . Zocor [Simvastatin] Hives and Swelling    Past Medical History  Diagnosis Date  . CAD (coronary artery disease)   . Atrial fibrillation (HCC)   . CHF (congestive heart failure) (HCC)   . Hyperlipidemia   . HTN (hypertension)   . MI (myocardial infarction) (HCC)   . DM (diabetes mellitus) (HCC)   . COPD (chronic obstructive pulmonary disease) (HCC)   . Vertigo   . Anxiety   . Obesity   . Hx of CABG   . Gout   . Arthritis   . Kidney stones     x 3    Past Surgical History  Procedure Laterality Date  . Coronary artery bypass graft  12/2001  . S/p dilatation and  curettage    . Tubal ligation      Family History  Problem Relation Age of Onset  . Hip fracture Mother     died at 59 following hip fx  . Diabetes Sister   . Lung cancer Maternal Uncle     x 2  . Breast cancer Maternal Aunt   . Colon cancer Neg Hx   . Crohn's disease Daughter   . Diabetes Maternal Grandmother   . Diabetes Mother     borderline  . Kidney disease Father     Bright's disease    Social History   Social History  . Marital Status: Married    Spouse Name: N/A  . Number of Children: 4  . Years of Education: N/A   Occupational History  . Not on file.   Social History Main Topics  . Smoking status: Former Smoker    Types: Cigarettes    Quit date: 09/01/1991  . Smokeless tobacco: Never Used     Comment: has a 30 pack year history, quit in late 1990s  . Alcohol Use: No  . Drug Use: No  . Sexual Activity: Not on file   Other Topics Concern  . Not on file   Social History Narrative   The PMH, PSH, Social History, Family History, Medications, and allergies have been reviewed in [facility], and have been updated if relevant.  Review of Systems  Constitutional: Negative.   HENT: Negative.   Respiratory: Negative.   Cardiovascular: Negative.   Gastrointestinal: Negative.   Musculoskeletal: Negative.   Skin: Negative.   Allergic/Immunologic: Negative.   Neurological: Negative.   Hematological: Negative.   Psychiatric/Behavioral: Negative.   All other systems reviewed and are negative.       Past Medical History  Diagnosis Date  . CAD (coronary artery disease)   . Atrial fibrillation (HCC)   . CHF (congestive heart failure) (HCC)   . Hyperlipidemia   . HTN (hypertension)   . MI (myocardial infarction) (HCC)   . DM (diabetes mellitus) (HCC)   . COPD (chronic obstructive pulmonary disease) (HCC)   . Vertigo   . Anxiety   . Obesity   . Hx of CABG   . Gout   . Arthritis   . Kidney stones     x 3    Current Outpatient Prescriptions  Medication  Sig Dispense Refill  . acetaminophen (TYLENOL) 500 MG tablet Take 500 mg by mouth every 6 (six) hours as needed for mild pain or headache.    Marland Kitchen aspirin EC 325 MG  tablet Take 325 mg by mouth daily.     . cholecalciferol (VITAMIN D) 1000 UNITS tablet Take 1,000 Units by mouth daily.     . cyclobenzaprine (FLEXERIL) 5 MG tablet TAKE ONE TABLET BY MOUTH THREE TIMES DAILY AS NEEDED FOR MUSCLE SPASM 20 tablet 0  . enalapril (VASOTEC) 20 MG tablet TAKE ONE TABLET BY MOUTH ONCE DAILY 90 tablet 3  . febuxostat (ULORIC) 40 MG tablet Take 40 mg by mouth daily.     . furosemide (LASIX) 40 MG tablet Take 1 tablet (40 mg total) by mouth daily. 30 tablet 6  . glipiZIDE (GLUCOTROL) 5 MG tablet TAKE TWO TABLETS BY MOUTH TWICE DAILY BEFORE MEAL(S) 120 tablet 0  . insulin NPH (HUMULIN N,NOVOLIN N) 100 UNIT/ML injection Inject 82 Units into the skin 2 (two) times daily. 5 vial 3  . meclizine (ANTIVERT) 25 MG tablet Take 1 tablet (25 mg total) by mouth 3 (three) times daily as needed for dizziness. 30 tablet 0  . metoprolol (LOPRESSOR) 50 MG tablet TAKE ONE & ONE-HALF TABLETS BY MOUTH TWICE DAILY 90 tablet 6  . Misc Natural Products (MIDNITE PM PO) Take by mouth. Sleep aid    . Multiple Vitamin (MULTIVITAMIN) capsule Take 1 capsule by mouth daily.      . nitroGLYCERIN (NITROSTAT) 0.4 MG SL tablet Place 1 tablet (0.4 mg total) under the tongue every 5 (five) minutes as needed for chest pain. 25 tablet 3  . traMADol (ULTRAM) 50 MG tablet Take 1 tablet (50 mg total) by mouth every 8 (eight) hours as needed. 30 tablet 0   Current Facility-Administered Medications  Medication Dose Route Frequency Provider Last Rate Last Dose  . hepatitis A-hepatitis B (TWINRIX) injection 1 mL  1 mL Intramuscular Once Lori P Hvozdovic, PA-C        Allergies  Allergen Reactions  . Colcrys [Colchicine] Other (See Comments)    Dizzy and fill like pass out  . Metformin And Related Swelling  . Pravastatin Swelling  . Sulfa Antibiotics  Swelling  . Zocor [Simvastatin] Hives and Swelling    Family History  Problem Relation Age of Onset  . Hip fracture Mother     died at 39 following hip fx  . Diabetes Sister   . Lung cancer Maternal Uncle     x 2  . Breast cancer Maternal Aunt   . Colon cancer Neg Hx   . Crohn's disease Daughter   . Diabetes Maternal Grandmother   . Diabetes Mother     borderline  . Kidney disease Father     Bright's disease    Social History   Social History  . Marital Status: Married    Spouse Name: N/A  . Number of Children: 4  . Years of Education: N/A   Occupational History  . Not on file.   Social History Main Topics  . Smoking status: Former Smoker    Types: Cigarettes    Quit date: 09/01/1991  . Smokeless tobacco: Never Used     Comment: has a 30 pack year history, quit in late 1990s  . Alcohol Use: No  . Drug Use: No  . Sexual Activity: Not on file   Other Topics Concern  . Not on file   Social History Narrative       Objective:   Physical Exam  Constitutional: She is oriented to person, place, and time. She appears well-developed. No distress.  HENT:  Head: Normocephalic.  Eyes: Conjunctivae are normal.  Cardiovascular:  Normal rate.   Pulmonary/Chest: Effort normal.  Musculoskeletal: Normal range of motion.  Neurological: She is alert and oriented to person, place, and time.  Skin: Skin is warm and dry.  Psychiatric: She has a normal mood and affect. Her behavior is normal. Judgment and thought content normal.    BP 134/82 mmHg  Pulse 77  Temp(Src) 97.4 F (36.3 C) (Oral)  Wt 246 lb 8 oz (111.812 kg)  SpO2 90% Wt Readings from Last 3 Encounters:  10/11/15 246 lb 8 oz (111.812 kg)  08/10/15 244 lb 8 oz (110.904 kg)  03/19/15 243 lb 3.2 oz (110.315 kg)         Assessment & Plan:

## 2015-10-25 ENCOUNTER — Telehealth: Payer: Self-pay

## 2015-10-25 NOTE — Telephone Encounter (Signed)
I have never heard of prior auth for shingles vaccine.  Please look into this for me.

## 2015-10-25 NOTE — Telephone Encounter (Signed)
Pt was seen 10/11/15; pt spoke with ins co and pt has never had chicken pox and ins is requiring prior authorization for shingles vaccine. Pt request cb.

## 2015-10-25 NOTE — Telephone Encounter (Signed)
Spoke to pt and advised per Dr Dayton Martes. Pt states that she will contact [employer] to confirm and contact office back. Will get at the same time as pneumonia vaccine

## 2015-10-30 NOTE — Telephone Encounter (Signed)
Pt called her insurance co  They will pay for shingles and pneumonia shot Pt stated she has never had chicken pox does she still ned shingles vaccine

## 2015-10-30 NOTE — Telephone Encounter (Signed)
Ok to order and schedule lab visit as requested.

## 2015-10-30 NOTE — Telephone Encounter (Signed)
She may have been exposed to the chicken pox.  We can check titers here to see if she needs the shingles vaccine.  Please let me know if she would like to do that.

## 2015-10-30 NOTE — Telephone Encounter (Signed)
Spoke to pt and advised per Dr Dayton Martes. Pt is wanting to complete titers

## 2015-11-06 ENCOUNTER — Other Ambulatory Visit: Payer: BLUE CROSS/BLUE SHIELD

## 2015-11-12 ENCOUNTER — Other Ambulatory Visit (INDEPENDENT_AMBULATORY_CARE_PROVIDER_SITE_OTHER): Payer: BLUE CROSS/BLUE SHIELD

## 2015-11-12 DIAGNOSIS — R945 Abnormal results of liver function studies: Secondary | ICD-10-CM

## 2015-11-12 DIAGNOSIS — Z0184 Encounter for antibody response examination: Secondary | ICD-10-CM | POA: Diagnosis not present

## 2015-11-12 DIAGNOSIS — R7989 Other specified abnormal findings of blood chemistry: Secondary | ICD-10-CM

## 2015-11-12 NOTE — Addendum Note (Signed)
Addended by: Alvina Chou on: 11/12/2015 04:10 PM   Modules accepted: Orders

## 2015-11-13 ENCOUNTER — Telehealth: Payer: Self-pay | Admitting: Family Medicine

## 2015-11-13 LAB — HEPATITIS B SURFACE ANTIBODY,QUALITATIVE: Hep B S Ab: NEGATIVE

## 2015-11-13 LAB — VARICELLA ZOSTER ANTIBODY, IGG: Varicella IgG: 2119 Index — ABNORMAL HIGH (ref <135.00)

## 2015-11-13 LAB — HEPATITIS A ANTIBODY, TOTAL: Hep A Total Ab: NONREACTIVE

## 2015-11-13 NOTE — Telephone Encounter (Signed)
Pt called stating her gi dr called her saying she needs hep A&B shots She wanted to know if she could get these here Please advise

## 2015-11-13 NOTE — Telephone Encounter (Signed)
Please schedule nurse visit

## 2015-11-15 ENCOUNTER — Ambulatory Visit: Payer: BLUE CROSS/BLUE SHIELD

## 2015-11-15 NOTE — Telephone Encounter (Signed)
Appointment 12/6 

## 2015-11-20 ENCOUNTER — Ambulatory Visit (INDEPENDENT_AMBULATORY_CARE_PROVIDER_SITE_OTHER): Payer: BLUE CROSS/BLUE SHIELD | Admitting: *Deleted

## 2015-11-20 DIAGNOSIS — Z23 Encounter for immunization: Secondary | ICD-10-CM

## 2015-12-04 ENCOUNTER — Other Ambulatory Visit: Payer: Self-pay | Admitting: Family Medicine

## 2015-12-04 NOTE — Telephone Encounter (Signed)
See allergy/contraindiction. Is it okay to refill? Last office visit 1027/16

## 2015-12-26 ENCOUNTER — Other Ambulatory Visit: Payer: Self-pay | Admitting: Family Medicine

## 2015-12-26 NOTE — Telephone Encounter (Signed)
pts last Rx and f/u 09/2015 pls advise

## 2016-01-18 ENCOUNTER — Telehealth: Payer: Self-pay | Admitting: *Deleted

## 2016-01-18 MED ORDER — CYCLOBENZAPRINE HCL 5 MG PO TABS: 5.0000 mg | ORAL_TABLET | Freq: Three times a day (TID) | ORAL | Status: AC | PRN

## 2016-01-18 NOTE — Telephone Encounter (Signed)
Flexeril eRx sent to [facility].

## 2016-01-18 NOTE — Telephone Encounter (Signed)
Pt left voicemail at Triage, she said she is out of her flexeril 5mg  so she is requesting if she could take one of her husbands flexeril 10mg  tablets because she is "hurting real bad", pt request call back

## 2016-03-27 ENCOUNTER — Other Ambulatory Visit: Payer: Self-pay | Admitting: Cardiology

## 2016-03-27 ENCOUNTER — Other Ambulatory Visit: Payer: Self-pay | Admitting: Family Medicine

## 2016-03-27 NOTE — Telephone Encounter (Signed)
Rx(s) sent to pharmacy electronically.  

## 2016-03-27 NOTE — Telephone Encounter (Signed)
Rx sent electronically.  

## 2016-04-09 ENCOUNTER — Other Ambulatory Visit: Payer: Self-pay | Admitting: Cardiology

## 2016-04-09 DIAGNOSIS — I6523 Occlusion and stenosis of bilateral carotid arteries: Secondary | ICD-10-CM

## 2016-04-17 ENCOUNTER — Inpatient Hospital Stay (HOSPITAL_COMMUNITY): Admission: RE | Admit: 2016-04-17 | Payer: BLUE CROSS/BLUE SHIELD | Source: Ambulatory Visit

## 2016-05-21 ENCOUNTER — Inpatient Hospital Stay (HOSPITAL_COMMUNITY): Admission: RE | Admit: 2016-05-21 | Payer: PPO | Source: Ambulatory Visit

## 2016-05-27 ENCOUNTER — Other Ambulatory Visit: Payer: Self-pay | Admitting: Family Medicine

## 2016-05-27 NOTE — Telephone Encounter (Signed)
Spoke to pt and f/u scheduled. #[phone] sent to requested pharmacy

## 2016-05-27 NOTE — Telephone Encounter (Signed)
Agree. Ok to refill one time only and needs labs for further refills.

## 2016-05-27 NOTE — Telephone Encounter (Signed)
Last A1C abnormal. pls advise 

## 2016-06-03 ENCOUNTER — Ambulatory Visit (INDEPENDENT_AMBULATORY_CARE_PROVIDER_SITE_OTHER): Payer: PPO | Admitting: Family Medicine

## 2016-06-03 ENCOUNTER — Encounter: Payer: Self-pay | Admitting: Family Medicine

## 2016-06-03 VITALS — BP 130/76 | HR 63 | Temp 97.2°F | Wt 247.5 lb

## 2016-06-03 DIAGNOSIS — I4891 Unspecified atrial fibrillation: Secondary | ICD-10-CM

## 2016-06-03 DIAGNOSIS — E785 Hyperlipidemia, unspecified: Secondary | ICD-10-CM

## 2016-06-03 DIAGNOSIS — E0821 Diabetes mellitus due to underlying condition with diabetic nephropathy: Secondary | ICD-10-CM | POA: Diagnosis not present

## 2016-06-03 DIAGNOSIS — N183 Chronic kidney disease, stage 3 unspecified: Secondary | ICD-10-CM

## 2016-06-03 DIAGNOSIS — I10 Essential (primary) hypertension: Secondary | ICD-10-CM | POA: Diagnosis not present

## 2016-06-03 DIAGNOSIS — E559 Vitamin D deficiency, unspecified: Secondary | ICD-10-CM | POA: Diagnosis not present

## 2016-06-03 DIAGNOSIS — I5022 Chronic systolic (congestive) heart failure: Secondary | ICD-10-CM

## 2016-06-03 DIAGNOSIS — M1000 Idiopathic gout, unspecified site: Secondary | ICD-10-CM

## 2016-06-03 LAB — COMPREHENSIVE METABOLIC PANEL
ALT: 23 U/L (ref 0–35)
AST: 24 U/L (ref 0–37)
Albumin: 3.5 g/dL (ref 3.5–5.2)
Alkaline Phosphatase: 102 U/L (ref 39–117)
BUN: 63 mg/dL — ABNORMAL HIGH (ref 6–23)
CO2: 25 mEq/L (ref 19–32)
Calcium: 9.4 mg/dL (ref 8.4–10.5)
Chloride: 104 mEq/L (ref 96–112)
Creatinine, Ser: 1.63 mg/dL — ABNORMAL HIGH (ref 0.40–1.20)
GFR: 33.63 mL/min — ABNORMAL LOW (ref >60.00)
Glucose, Bld: 59 mg/dL — ABNORMAL LOW (ref 70–99)
Potassium: 4.5 mEq/L (ref 3.5–5.1)
Sodium: 137 mEq/L (ref 135–145)
Total Bilirubin: 0.4 mg/dL (ref 0.2–1.2)
Total Protein: 7.2 g/dL (ref 6.0–8.3)

## 2016-06-03 LAB — LIPID PANEL
Cholesterol: 192 mg/dL (ref 0–200)
HDL: 37.3 mg/dL — AB (ref >39.00)
LDL Cholesterol: 130 mg/dL — ABNORMAL HIGH (ref 0–99)
NonHDL: 154.8
TRIGLYCERIDES: 123 mg/dL (ref 0.0–149.0)
Total CHOL/HDL Ratio: 5
VLDL: 24.6 mg/dL (ref 0.0–40.0)

## 2016-06-03 LAB — VITAMIN D 25 HYDROXY (VIT D DEFICIENCY, FRACTURES): VITD: 47.93 ng/mL (ref 30.00–100.00)

## 2016-06-03 LAB — HEMOGLOBIN A1C: Hgb A1c MFr Bld: 8.5 % — ABNORMAL HIGH (ref 4.6–6.5)

## 2016-06-03 NOTE — Progress Notes (Signed)
Subjective:    Patient ID: Cassandra Fisher, female    DOB: 1951/10/28, 65 y.o.   MRN: [id number]  HPI   65 yo pleasant female here to follow up chronic medical conditions.  Gout- no recent flares. She feels tart cherry juice has been a good prophylaxis.  HTN: Well controlled on Enalapril, Lasix and Lopressor. Denies HA, blurred vision, CP or SOB.  CKD III- was followed by renal, Dr. Arrie Aran.  Was last seen on 10/20/14- note reviewed and scanned in Epic. Cassandra Fisher states that her husband is unfortunately in poor health and she does not have the money to see specialists. Lab Results  Component Value Date   CREATININE 1.54* 10/11/2015     HLD: has not been taking lovastatin.  Taking fish oil instead.  Lovastatin was causing myaglias.  Lab Results  Component Value Date   CHOL 207* 10/11/2015   HDL 37.80* 10/11/2015   LDLCALC 131* 10/11/2015   LDLDIRECT 145.0 02/07/2015   TRIG 190.0* 10/11/2015   CHOLHDL 5 10/11/2015   Lab Results  Component Value Date   ALT 39* 10/11/2015   AST 58* 10/11/2015   ALKPHOS 119* 10/11/2015   BILITOT 0.6 10/11/2015    DM2:  Lab Results  Component Value Date   HGBA1C 8.5* 10/11/2015   On Glipizide and NPH- 82 units twice daily. She was referred to endocrinology by me over a year ago.The patient had that referral cancelled, saying that she wanted to go to a different specialist than the one we had referred her to. She never made an appointment with any other endocrinologist. Checks FSBS twice daily- ranging 70s-90s fasting.  Current Outpatient Prescriptions on File Prior to Visit  Medication Sig Dispense Refill  . acetaminophen (TYLENOL) 500 MG tablet Take 500 mg by mouth every 6 (six) hours as needed for mild pain or headache.    Marland Kitchen aspirin EC 325 MG tablet Take 325 mg by mouth daily.     . cholecalciferol (VITAMIN D) 1000 UNITS tablet Take 1,000 Units by mouth daily.     . cyclobenzaprine (FLEXERIL) 5 MG tablet Take 1 tablet (5 mg total)  by mouth 3 (three) times daily as needed. for muscle spams 20 tablet 0  . enalapril (VASOTEC) 20 MG tablet TAKE ONE TABLET BY MOUTH ONCE DAILY 90 tablet 3  . febuxostat (ULORIC) 40 MG tablet Take 40 mg by mouth daily.     . furosemide (LASIX) 40 MG tablet TAKE ONE TABLET BY MOUTH ONCE DAILY 30 tablet 4  . glipiZIDE (GLUCOTROL) 5 MG tablet TAKE TWO TABLETS BY MOUTH TWICE DAILY BEFORE MEAL(S) 60 tablet 0  . insulin NPH (HUMULIN N,NOVOLIN N) 100 UNIT/ML injection Inject 82 Units into the skin 2 (two) times daily. 5 vial 3  . meclizine (ANTIVERT) 25 MG tablet Take 1 tablet (25 mg total) by mouth 3 (three) times daily as needed for dizziness. 30 tablet 0  . metoprolol (LOPRESSOR) 50 MG tablet TAKE ONE & ONE-HALF TABLETS BY MOUTH TWICE DAILY 90 tablet 4  . Misc Natural Products (MIDNITE PM PO) Take by mouth. Sleep aid    . Multiple Vitamin (MULTIVITAMIN) capsule Take 1 capsule by mouth daily.      . nitroGLYCERIN (NITROSTAT) 0.4 MG SL tablet Place 1 tablet (0.4 mg total) under the tongue every 5 (five) minutes as needed for chest pain. 25 tablet 3  . traMADol (ULTRAM) 50 MG tablet Take 1 tablet (50 mg total) by mouth every 8 (eight) hours as needed.  30 tablet 0   Current Facility-Administered Medications on File Prior to Visit  Medication Dose Route Frequency Provider Last Rate Last Dose  . hepatitis A-hepatitis B (TWINRIX) injection 1 mL  1 mL Intramuscular Once Lori P Hvozdovic, PA-C        Allergies  Allergen Reactions  . Colcrys [Colchicine] Other (See Comments)    Dizzy and fill like pass out  . Metformin And Related Swelling  . Pravastatin Swelling  . Sulfa Antibiotics Swelling  . Zocor [Simvastatin] Hives and Swelling    Past Medical History  Diagnosis Date  . CAD (coronary artery disease)   . Atrial fibrillation (HCC)   . CHF (congestive heart failure) (HCC)   . Hyperlipidemia   . HTN (hypertension)   . MI (myocardial infarction) (HCC)   . DM (diabetes mellitus) (HCC)   . COPD  (chronic obstructive pulmonary disease) (HCC)   . Vertigo   . Anxiety   . Obesity   . Hx of CABG   . Gout   . Arthritis   . Kidney stones     x 3    Past Surgical History  Procedure Laterality Date  . Coronary artery bypass graft  12/2001  . S/p dilatation and curettage    . Tubal ligation      Family History  Problem Relation Age of Onset  . Hip fracture Mother     died at 79 following hip fx  . Diabetes Sister   . Lung cancer Maternal Uncle     x 2  . Breast cancer Maternal Aunt   . Colon cancer Neg Hx   . Crohn's disease Daughter   . Diabetes Maternal Grandmother   . Diabetes Mother     borderline  . Kidney disease Father     Bright's disease    Social History   Social History  . Marital Status: Married    Spouse Name: N/A  . Number of Children: 4  . Years of Education: N/A   Occupational History  . Not on file.   Social History Main Topics  . Smoking status: Former Smoker    Types: Cigarettes    Quit date: 09/01/1991  . Smokeless tobacco: Never Used     Comment: has a 30 pack year history, quit in late 1990s  . Alcohol Use: No  . Drug Use: No  . Sexual Activity: Not on file   Other Topics Concern  . Not on file   Social History Narrative   The PMH, PSH, Social History, Family History, Medications, and allergies have been reviewed in [facility], and have been updated if relevant.  Review of Systems  Constitutional: Negative.   HENT: Negative.   Respiratory: Negative.   Cardiovascular: Negative.   Gastrointestinal: Negative.   Musculoskeletal: Negative.   Skin: Negative.   Allergic/Immunologic: Negative.   Neurological: Negative.   Hematological: Negative.   Psychiatric/Behavioral: Negative.   All other systems reviewed and are negative.       Past Medical History  Diagnosis Date  . CAD (coronary artery disease)   . Atrial fibrillation (HCC)   . CHF (congestive heart failure) (HCC)   . Hyperlipidemia   . HTN (hypertension)   . MI  (myocardial infarction) (HCC)   . DM (diabetes mellitus) (HCC)   . COPD (chronic obstructive pulmonary disease) (HCC)   . Vertigo   . Anxiety   . Obesity   . Hx of CABG   . Gout   . Arthritis   .  Kidney stones     x 3    Current Outpatient Prescriptions  Medication Sig Dispense Refill  . acetaminophen (TYLENOL) 500 MG tablet Take 500 mg by mouth every 6 (six) hours as needed for mild pain or headache.    Marland Kitchen aspirin EC 325 MG tablet Take 325 mg by mouth daily.     . cholecalciferol (VITAMIN D) 1000 UNITS tablet Take 1,000 Units by mouth daily.     . cyclobenzaprine (FLEXERIL) 5 MG tablet Take 1 tablet (5 mg total) by mouth 3 (three) times daily as needed. for muscle spams 20 tablet 0  . enalapril (VASOTEC) 20 MG tablet TAKE ONE TABLET BY MOUTH ONCE DAILY 90 tablet 3  . febuxostat (ULORIC) 40 MG tablet Take 40 mg by mouth daily.     . furosemide (LASIX) 40 MG tablet TAKE ONE TABLET BY MOUTH ONCE DAILY 30 tablet 4  . glipiZIDE (GLUCOTROL) 5 MG tablet TAKE TWO TABLETS BY MOUTH TWICE DAILY BEFORE MEAL(S) 60 tablet 0  . insulin NPH (HUMULIN N,NOVOLIN N) 100 UNIT/ML injection Inject 82 Units into the skin 2 (two) times daily. 5 vial 3  . meclizine (ANTIVERT) 25 MG tablet Take 1 tablet (25 mg total) by mouth 3 (three) times daily as needed for dizziness. 30 tablet 0  . metoprolol (LOPRESSOR) 50 MG tablet TAKE ONE & ONE-HALF TABLETS BY MOUTH TWICE DAILY 90 tablet 4  . Misc Natural Products (MIDNITE PM PO) Take by mouth. Sleep aid    . Multiple Vitamin (MULTIVITAMIN) capsule Take 1 capsule by mouth daily.      . nitroGLYCERIN (NITROSTAT) 0.4 MG SL tablet Place 1 tablet (0.4 mg total) under the tongue every 5 (five) minutes as needed for chest pain. 25 tablet 3  . traMADol (ULTRAM) 50 MG tablet Take 1 tablet (50 mg total) by mouth every 8 (eight) hours as needed. 30 tablet 0   Current Facility-Administered Medications  Medication Dose Route Frequency Provider Last Rate Last Dose  . hepatitis  A-hepatitis B (TWINRIX) injection 1 mL  1 mL Intramuscular Once Lori P Hvozdovic, PA-C        Allergies  Allergen Reactions  . Colcrys [Colchicine] Other (See Comments)    Dizzy and fill like pass out  . Metformin And Related Swelling  . Pravastatin Swelling  . Sulfa Antibiotics Swelling  . Zocor [Simvastatin] Hives and Swelling    Family History  Problem Relation Age of Onset  . Hip fracture Mother     died at 43 following hip fx  . Diabetes Sister   . Lung cancer Maternal Uncle     x 2  . Breast cancer Maternal Aunt   . Colon cancer Neg Hx   . Crohn's disease Daughter   . Diabetes Maternal Grandmother   . Diabetes Mother     borderline  . Kidney disease Father     Bright's disease    Social History   Social History  . Marital Status: Married    Spouse Name: N/A  . Number of Children: 4  . Years of Education: N/A   Occupational History  . Not on file.   Social History Main Topics  . Smoking status: Former Smoker    Types: Cigarettes    Quit date: 09/01/1991  . Smokeless tobacco: Never Used     Comment: has a 30 pack year history, quit in late 1990s  . Alcohol Use: No  . Drug Use: No  . Sexual Activity: Not on file  Other Topics Concern  . Not on file   Social History Narrative       Objective:   Physical Exam  Constitutional: She is oriented to person, place, and time. She appears well-developed. No distress.  HENT:  Head: Normocephalic.  Eyes: Conjunctivae are normal.  Cardiovascular: Normal rate.   Pulmonary/Chest: Effort normal.  Musculoskeletal: Normal range of motion.  Neurological: She is alert and oriented to person, place, and time.  Skin: Skin is warm and dry.  Psychiatric: She has a normal mood and affect. Her behavior is normal. Judgment and thought content normal.    BP 130/76 mmHg  Pulse 63  Temp(Src) 97.2 F (36.2 C) (Oral)  Wt 247 lb 8 oz (112.265 kg)  SpO2 98% Wt Readings from Last 3 Encounters:  06/03/16 247 lb 8 oz  (112.265 kg)  10/11/15 246 lb 8 oz (111.812 kg)  08/10/15 244 lb 8 oz (110.904 kg)         Assessment & Plan:

## 2016-06-03 NOTE — Assessment & Plan Note (Signed)
Continue current rx. Due for a1c today. She has refused endo referral. Also stopped taking statin.

## 2016-06-03 NOTE — Assessment & Plan Note (Signed)
Refusing to take statin despite h/o CAD and MI. She is only taking fish oil.

## 2016-06-03 NOTE — Assessment & Plan Note (Signed)
No recent flares. She feels cherry juice has helped.

## 2016-06-03 NOTE — Progress Notes (Signed)
Pre visit review using our clinic review tool, if applicable. No additional management support is needed unless otherwise documented below in the visit note. 

## 2016-06-03 NOTE — Assessment & Plan Note (Signed)
Due for labs today. 

## 2016-06-03 NOTE — Assessment & Plan Note (Signed)
No longer followed by renal. Check GFR, Cr today.

## 2016-06-24 ENCOUNTER — Telehealth: Payer: Self-pay | Admitting: Family Medicine

## 2016-06-24 NOTE — Telephone Encounter (Signed)
I spoke with pt; pts right hand is tight and swollen and red on back of hand; no red streaks, no fever,and pain level is 10; pt said just hurting;pt cannot describe pain as sharpe or dull. Rt elbow is hurting but not red. TH note mentioned pt has taken prednisone but pt said Dr Maple Hudson gave prednisone but that was a long time a go. Pt is not on prednisone now. Offered pt appt this afternoon but Pt absolutely refuses appt; pt is sure it is gout and wants med sent to [facility] [address]. Pt request cb.Please advise.

## 2016-06-24 NOTE — Telephone Encounter (Signed)
Spoke to pt and advised per Dr Dayton Martes. Pt not wanting to schedule OV at this time.

## 2016-06-24 NOTE — Telephone Encounter (Signed)
I'm not sure what she is asking because she is allergic to colchicine.  It would be best for her to be seen.

## 2016-06-24 NOTE — Telephone Encounter (Signed)
[facility] Day - Client TELEPHONE ADVICE RECORD [facility]  Patient Name: Cassandra Fisher  DOB: [birth year]    Initial Comment Caller States she would like a script called in for her inflammation, is having a lot of swelling   Nurse Assessment  Nurse: Laural Benes, RN, Dondra Spry Date/Time (Eastern Time): 06/24/2016 9:26:22 AM  Confirm and document reason for call. If symptomatic, describe symptoms. You must click the next button to save text entered. ---[name] is having swelling of right arm and right hand -- does have history of gout -- requesting medication for inflammation.  Has the patient traveled out of the country within the last 30 days? ---No  Does the patient have any new or worsening symptoms? ---Yes  Will a triage be completed? ---No  Select reason for no triage. ---Patient declined  Please document clinical information provided and list any resource used. ---[name] states she was placed prednisone for this does not like the prednisone -- can't come in for an appt which was offered can't get dressed due to swelling and pain in right arm.     Guidelines    Guideline Title Affirmed Question Affirmed Notes       Final Disposition User

## 2016-06-30 ENCOUNTER — Other Ambulatory Visit: Payer: Self-pay | Admitting: Family Medicine

## 2016-07-17 ENCOUNTER — Emergency Department: Payer: PPO

## 2016-07-17 ENCOUNTER — Encounter: Payer: Self-pay | Admitting: Emergency Medicine

## 2016-07-17 ENCOUNTER — Emergency Department
Admission: EM | Admit: 2016-07-17 | Discharge: 2016-07-17 | Disposition: A | Discharge status: Home / Self Care | Payer: PPO | Attending: Emergency Medicine | Admitting: Emergency Medicine

## 2016-07-17 DIAGNOSIS — M4726 Other spondylosis with radiculopathy, lumbar region: Secondary | ICD-10-CM | POA: Insufficient documentation

## 2016-07-17 DIAGNOSIS — M5440 Lumbago with sciatica, unspecified side: Secondary | ICD-10-CM

## 2016-07-17 DIAGNOSIS — N183 Chronic kidney disease, stage 3 (moderate): Secondary | ICD-10-CM | POA: Insufficient documentation

## 2016-07-17 DIAGNOSIS — Z7982 Long term (current) use of aspirin: Secondary | ICD-10-CM | POA: Diagnosis not present

## 2016-07-17 DIAGNOSIS — I5022 Chronic systolic (congestive) heart failure: Secondary | ICD-10-CM | POA: Diagnosis not present

## 2016-07-17 DIAGNOSIS — M5442 Lumbago with sciatica, left side: Secondary | ICD-10-CM | POA: Diagnosis not present

## 2016-07-17 DIAGNOSIS — G8929 Other chronic pain: Secondary | ICD-10-CM | POA: Diagnosis not present

## 2016-07-17 DIAGNOSIS — Z79899 Other long term (current) drug therapy: Secondary | ICD-10-CM | POA: Diagnosis not present

## 2016-07-17 DIAGNOSIS — E119 Type 2 diabetes mellitus without complications: Secondary | ICD-10-CM | POA: Diagnosis not present

## 2016-07-17 DIAGNOSIS — Z794 Long term (current) use of insulin: Secondary | ICD-10-CM | POA: Diagnosis not present

## 2016-07-17 DIAGNOSIS — I4891 Unspecified atrial fibrillation: Secondary | ICD-10-CM | POA: Diagnosis not present

## 2016-07-17 DIAGNOSIS — I252 Old myocardial infarction: Secondary | ICD-10-CM | POA: Diagnosis not present

## 2016-07-17 DIAGNOSIS — I2510 Atherosclerotic heart disease of native coronary artery without angina pectoris: Secondary | ICD-10-CM | POA: Diagnosis not present

## 2016-07-17 DIAGNOSIS — J449 Chronic obstructive pulmonary disease, unspecified: Secondary | ICD-10-CM | POA: Diagnosis not present

## 2016-07-17 DIAGNOSIS — M545 Low back pain: Secondary | ICD-10-CM | POA: Diagnosis present

## 2016-07-17 DIAGNOSIS — Z87891 Personal history of nicotine dependence: Secondary | ICD-10-CM | POA: Insufficient documentation

## 2016-07-17 DIAGNOSIS — I130 Hypertensive heart and chronic kidney disease with heart failure and stage 1 through stage 4 chronic kidney disease, or unspecified chronic kidney disease: Secondary | ICD-10-CM | POA: Insufficient documentation

## 2016-07-17 DIAGNOSIS — M5441 Lumbago with sciatica, right side: Secondary | ICD-10-CM | POA: Diagnosis not present

## 2016-07-17 LAB — BASIC METABOLIC PANEL
Anion gap: 9 (ref 5–15)
BUN: 37 mg/dL — AB (ref 6–20)
CHLORIDE: 101 mmol/L (ref 101–111)
CO2: 26 mmol/L (ref 22–32)
Calcium: 8.9 mg/dL (ref 8.9–10.3)
Creatinine, Ser: 1.41 mg/dL — ABNORMAL HIGH (ref 0.44–1.00)
GFR calc Af Amer: 44 mL/min — ABNORMAL LOW (ref >60)
GFR calc non Af Amer: 38 mL/min — ABNORMAL LOW (ref >60)
Glucose, Bld: 284 mg/dL — ABNORMAL HIGH (ref 65–99)
POTASSIUM: 4.5 mmol/L (ref 3.5–5.1)
SODIUM: 136 mmol/L (ref 135–145)

## 2016-07-17 LAB — CBC WITH DIFFERENTIAL/PLATELET
Basophils Absolute: 0.1 10*3/uL (ref 0–0.1)
Basophils Relative: 1 %
EOS PCT: 2 %
Eosinophils Absolute: 0.1 10*3/uL (ref 0–0.7)
HCT: 36.6 % (ref 35.0–47.0)
HEMOGLOBIN: 12.3 g/dL (ref 12.0–16.0)
LYMPHS ABS: 0.9 10*3/uL — AB (ref 1.0–3.6)
LYMPHS PCT: 18 %
MCH: 30.6 pg (ref 26.0–34.0)
MCHC: 33.7 g/dL (ref 32.0–36.0)
MCV: 90.8 fL (ref 80.0–100.0)
MONOS PCT: 5 %
Monocytes Absolute: 0.3 10*3/uL (ref 0.2–0.9)
Neutro Abs: 4 10*3/uL (ref 1.4–6.5)
Neutrophils Relative %: 74 %
PLATELETS: 118 10*3/uL — AB (ref 150–440)
RBC: 4.03 MIL/uL (ref 3.80–5.20)
RDW: 16.1 % — ABNORMAL HIGH (ref 11.5–14.5)
WBC: 5.4 10*3/uL (ref 3.6–11.0)

## 2016-07-17 LAB — HEPATIC FUNCTION PANEL
ALK PHOS: 113 U/L (ref 38–126)
ALT: 20 U/L (ref 14–54)
AST: 36 U/L (ref 15–41)
Albumin: 3.2 g/dL — ABNORMAL LOW (ref 3.5–5.0)
BILIRUBIN DIRECT: 0.2 mg/dL (ref 0.1–0.5)
BILIRUBIN INDIRECT: 0.4 mg/dL (ref 0.3–0.9)
Total Bilirubin: 0.6 mg/dL (ref 0.3–1.2)
Total Protein: 7.1 g/dL (ref 6.5–8.1)

## 2016-07-17 LAB — MAGNESIUM: MAGNESIUM: 1.9 mg/dL (ref 1.7–2.4)

## 2016-07-17 MED ORDER — HYDROCODONE-ACETAMINOPHEN 5-325 MG PO TABS
2.0000 | ORAL_TABLET | ORAL | Status: AC
  Administered 2016-07-17: 2 via ORAL
  Filled 2016-07-17: qty 2

## 2016-07-17 MED ORDER — DOCUSATE SODIUM 100 MG PO CAPS: ORAL_CAPSULE | ORAL | 0 refills | Status: DC

## 2016-07-17 MED ORDER — HYDROCODONE-ACETAMINOPHEN 5-325 MG PO TABS: 1.0000 | ORAL_TABLET | ORAL | 0 refills | Status: DC | PRN

## 2016-07-17 NOTE — ED Triage Notes (Addendum)
Patient presents to the ED with bilateral lower leg and foot pain with lower back pain x 4 weeks.  Patient states today the pain is so bad that she cannot stand or walk.  Patient has mobility and some sensation to feet although patient is a diabetic and has some foot numbness at baseline.  Patient's toes are reddened and skin on feet is very flaky and dry.  Patient's feet appear slightly swollen with the right foot more swollen than the left.  Patient reports history of problems with gout and arthritis.  Patient denies chest pain and shortness of breath.  Patient states denies pain unless she is standing.

## 2016-07-17 NOTE — ED Notes (Signed)
Patient transported to radiology

## 2016-07-17 NOTE — ED Provider Notes (Signed)
[facility] Emergency Department Provider Note  ____________________________________________   First MD Initiated Contact with Patient 07/17/16 1701     (approximate)  I have reviewed the triage vital signs and the nursing notes.   HISTORY  Chief Complaint Leg Pain and Back Pain    HPI Cassandra Fisher is a 65 y.o. female with multiple chronic medical problems who presents with acute on chronic lower back pain.  She reports that while she always suffers from diabetic neuropathy, within the last couple of days she has developed severe sharp stabbing lower back pain that radiates down the back of both legs.  She has been told in the past that she had sciatica on the right which completely resolved but she states this feels completely different.  The pain is so severe today that she cannot stand on her own for more than a few seconds at a time before falling over.  She denies numbness and tingling in her extremities but feels the pain radiating from the low back all the way down to her feet and states that both her legs feel weak, possibly due to the pain.  She denies urinary retention/incontinence and no bowel incontinence . Movement, standing, attempting to stand makes the symptoms worse, resting makes them a bit better.  Denies fever/chills, CP, SOB, N/V/D, dysuria.  Past Medical History:  Diagnosis Date  . Anxiety   . Arthritis   . Atrial fibrillation (HCC)   . CAD (coronary artery disease)   . CHF (congestive heart failure) (HCC)   . COPD (chronic obstructive pulmonary disease) (HCC)   . DM (diabetes mellitus) (HCC)   . Gout   . HTN (hypertension)   . Hx of CABG   . Hyperlipidemia   . Kidney stones    x 3  . MI (myocardial infarction) (HCC)   . Obesity   . Vertigo     Patient Active Problem List   Diagnosis Date Noted  . Type 2 diabetes mellitus with hyperglycemia (HCC) 06/27/2014  . Cardiomyopathy (HCC) 05/25/2014  . CKD (chronic kidney disease)  stage 3, GFR 30-59 ml/min 05/25/2014  . Vitamin D deficiency 04/03/2014  . Gout 04/03/2014  . Hyperkalemia 03/25/2014  . Arthralgia 12/22/2013  . Elevated sed rate 09/09/2013  . Carotid stenosis 04/21/2013  . Hx of CABG   . Cerebrovascular disease 01/27/2011  . Diabetes mellitus with renal complications (HCC) 11/27/2010  . HLD (hyperlipidemia) 11/27/2010  . Obesity 11/27/2010  . ANXIETY 11/27/2010  . Essential hypertension 11/27/2010  . MI 11/27/2010  . Coronary atherosclerosis 11/27/2010  . ATRIAL FIBRILLATION 11/27/2010  . Chronic systolic heart failure (HCC) 11/27/2010  . COPD 11/27/2010  . Backache 11/27/2010    Past Surgical History:  Procedure Laterality Date  . CORONARY ARTERY BYPASS GRAFT  12/2001  . s/p dilatation and curettage    . TUBAL LIGATION      Prior to Admission medications   Medication Sig Start Date End Date Taking? Authorizing Provider  acetaminophen (TYLENOL) 500 MG tablet Take 500 mg by mouth every 6 (six) hours as needed for mild pain or headache.    Historical Provider, MD  aspirin EC 325 MG tablet Take 325 mg by mouth daily.     Historical Provider, MD  cholecalciferol (VITAMIN D) 1000 UNITS tablet Take 1,000 Units by mouth daily.     Historical Provider, MD  cyclobenzaprine (FLEXERIL) 5 MG tablet Take 1 tablet (5 mg total) by mouth 3 (three) times daily as needed. for  muscle spams 01/18/16   Dianne Dun, MD  docusate sodium (COLACE) 100 MG capsule Take 1 tablet once or twice daily as needed for constipation while taking narcotic pain medicine 07/17/16   Loleta Rose, MD  enalapril (VASOTEC) 20 MG tablet TAKE ONE TABLET BY MOUTH ONCE DAILY 09/04/15   Beatrice Lecher, PA-C  febuxostat (ULORIC) 40 MG tablet Take 40 mg by mouth daily.  04/03/14   Dianne Dun, MD  furosemide (LASIX) 40 MG tablet TAKE ONE TABLET BY MOUTH ONCE DAILY 03/27/16   Lewayne Bunting, MD  glipiZIDE (GLUCOTROL) 5 MG tablet TAKE TWO TABLETS BY MOUTH TWICE DAILY BEFORE MEAL(S) 07/01/16    Dianne Dun, MD  HYDROcodone-acetaminophen (NORCO/VICODIN) 5-325 MG tablet Take 1-2 tablets by mouth every 4 (four) hours as needed for moderate pain. 07/17/16   Loleta Rose, MD  insulin NPH (HUMULIN N,NOVOLIN N) 100 UNIT/ML injection Inject 82 Units into the skin 2 (two) times daily. 09/12/13   Dianne Dun, MD  meclizine (ANTIVERT) 25 MG tablet Take 1 tablet (25 mg total) by mouth 3 (three) times daily as needed for dizziness. 02/07/15   Dianne Dun, MD  metoprolol (LOPRESSOR) 50 MG tablet TAKE ONE & ONE-HALF TABLETS BY MOUTH TWICE DAILY 03/27/16   Lewayne Bunting, MD  Misc Natural Products (MIDNITE PM PO) Take by mouth. Sleep aid    Historical Provider, MD  Multiple Vitamin (MULTIVITAMIN) capsule Take 1 capsule by mouth daily.      Historical Provider, MD  nitroGLYCERIN (NITROSTAT) 0.4 MG SL tablet Place 1 tablet (0.4 mg total) under the tongue every 5 (five) minutes as needed for chest pain. 05/25/14   Beatrice Lecher, PA-C  traMADol (ULTRAM) 50 MG tablet Take 1 tablet (50 mg total) by mouth every 8 (eight) hours as needed. 12/22/13   Dianne Dun, MD    Allergies Colcrys [colchicine]; Metformin and related; Pravastatin; Sulfa antibiotics; and Zocor [simvastatin]  Family History  Problem Relation Age of Onset  . Hip fracture Mother     died at 57 following hip fx  . Diabetes Mother     borderline  . Kidney disease Father     Bright's disease  . Diabetes Sister   . Lung cancer Maternal Uncle     x 2  . Breast cancer Maternal Aunt   . Crohn's disease Daughter   . Diabetes Maternal Grandmother   . Colon cancer Neg Hx     Social History Social History  Substance Use Topics  . Smoking status: Former Smoker    Types: Cigarettes    Quit date: 09/01/1991  . Smokeless tobacco: Never Used     Comment: has a 30 pack year history, quit in late 1990s  . Alcohol use No    Review of Systems Constitutional: No fever/chills Eyes: No visual changes. ENT: No sore throat. Cardiovascular:  Denies chest pain. Respiratory: Denies shortness of breath. Gastrointestinal: No abdominal pain.  No nausea, no vomiting.  No diarrhea.  No constipation. Genitourinary: Negative for dysuria. Musculoskeletal: Severe lower back pain radiating down both legs.  Inability to stand and/or walk Skin: Negative for rash. Neurological: Negative for headaches, focal weakness or numbness.  Weakness in bilateral legs, possibly secondary to pain, but patient is not sure.  10-point ROS otherwise negative.  ____________________________________________   PHYSICAL EXAM:  VITAL SIGNS: ED Triage Vitals  Enc Vitals Group     BP 07/17/16 1405 (!) 146/51     Pulse Rate 07/17/16  1405 69     Resp 07/17/16 1405 18     Temp 07/17/16 1405 97.7 F (36.5 C)     Temp Source 07/17/16 1405 Oral     SpO2 07/17/16 1405 93 %     Weight 07/17/16 1405 242 lb (109.8 kg)     Height 07/17/16 1405 5\' 7"  (1.702 m)     Head Circumference --      Peak Flow --      Pain Score 07/17/16 1406 0     Pain Loc --      Pain Edu? --      Excl. in GC? --     Constitutional: Alert and oriented. No acute distress but has the appearance of chronic illness Eyes: Conjunctivae are normal. PERRL. EOMI. Head: Atraumatic. Nose: No congestion/rhinnorhea. Mouth/Throat: Mucous membranes are moist.  Oropharynx non-erythematous. Neck: No stridor.  No meningeal signs.   Cardiovascular: Normal rate, regular rhythm. Good peripheral circulation. Grossly normal heart sounds.   Respiratory: Normal respiratory effort.  No retractions. Lungs CTAB. Gastrointestinal: Soft and nontender. No distention.  Musculoskeletal: Chronic lower extremity edema bilaterally.  The patient has severe pain with any attempt at flexion or extension of her lower extremities although strength does appear to be intact.  She has chronic neuropathy with decreased light touch sensation throughout.  She has no tenderness to palpation of her spine down through the thoracic  spine, but in the middle to lower part of her lumbar spine she has severe tenderness to palpation of the spine and paraspinal muscles. Neurologic:  Normal speech and language. No gross focal neurologic deficits are appreciated.  Skin:  Skin is warm, dry and intact. No rash noted. Psychiatric: Mood and affect are normal. Speech and behavior are normal.  ____________________________________________   LABS (all labs ordered are listed, but only abnormal results are displayed)  Labs Reviewed  BASIC METABOLIC PANEL - Abnormal; Notable for the following:       Result Value   Glucose, Bld 284 (*)    BUN 37 (*)    Creatinine, Ser 1.41 (*)    GFR calc non Af Amer 38 (*)    GFR calc Af Amer 44 (*)    All other components within normal limits  CBC WITH DIFFERENTIAL/PLATELET - Abnormal; Notable for the following:    RDW 16.1 (*)    Platelets 118 (*)    Lymphs Abs 0.9 (*)    All other components within normal limits  HEPATIC FUNCTION PANEL - Abnormal; Notable for the following:    Albumin 3.2 (*)    All other components within normal limits  MAGNESIUM   ____________________________________________  EKG  None ____________________________________________  RADIOLOGY   Dg Lumbar Spine 2-3 Views  Result Date: 07/17/2016 CLINICAL DATA:  65 year old female with lumbar back pain, bilateral leg and foot pain for 1 month. Difficulty walking. Initial encounter. EXAM: LUMBAR SPINE - 2-3 VIEW COMPARISON:  Lumbar radiographs 12/22/2013. FINDINGS: Normal lumbar segmentation. Extensive Aortoiliac calcified atherosclerosis noted. Chronic mild anterolisthesis at L3-L4 and L4-L5. Increased vacuum disc phenomena at those levels. Chronic vacuum disc at L5-S1. Chronic endplate spurring at each of these levels. Moderate lower lumbar facet hypertrophy. Stable vertebral height and alignment. Visible lower thoracic levels appear intact. Sacral ala and SI joints appear normal. Chronic 2.7 cm calcific foci projecting  in the right abdomen could be related to the right kidney, or possibly the gallbladder. These have not significantly changed since 2015. IMPRESSION: 1.  No acute osseous abnormality identified  in the lumbar spine 2. Progressed disc degeneration at L3-L4 and L4-L5 since 2015. Chronic mild anterolisthesis with associated lower lumbar facet arthropathy. 3. Chronic large right nephrolithiasis (favored) versus gallstones. 4. Extensive calcified aortic atherosclerosis. Electronically Signed   By: Odessa Fleming M.D.   On: 07/17/2016 20:19    ____________________________________________   PROCEDURES  Procedure(s) performed:   Procedures   ____________________________________________   INITIAL IMPRESSION / ASSESSMENT AND PLAN / ED COURSE  Pertinent labs & imaging results that were available during my care of the patient were reviewed by me and considered in my medical decision making (see chart for details).  The acute worsening of the patient's pain and her difficulty if not inability to stand is concerning.  I asked the patient to stand and she was able to maintain her balance fell a few seconds before falling backward, most likely due to pain, but I cannot rule out acute on chronic nerve impingement.  She has severe tenderness to palpation of the lumbar spine but no external evidence of trauma nor infection.  I will evaluate with a noncontrast MRI.  I have no concerns about a paraspinal or epidural abscess given the lack of systemic symptoms and risk factors and I am most concerned about spinal nerve impingement.  Clinical Course  Comment By Time  The patient was asking what was going on so I went over my plan with her.  She became very anxious and stated that under no circumstances could she tolerate an MRI.  I explained that that will be the best way to rule out an acute nerve compression causing her low back pain radiating down both legs.  I offered her alternatives such as pain medicine and  antianxiety medicine and she is refusing she and her family are well aware that I cannot guarantee that she does not have an acute impingement but Loleta Rose, MD 08/03 1933  Progressive osteoarthritis of the lumbar spine but there is no acute injury.  No acute fracture.  I discussed the plan with the patient and we will give her a walker for stability, oral pain medicine, and recommendation for outpatient follow-up.  Given the lack of any signs or symptoms of cauda equina syndrome and the long-term degeneration of the bones without any obvious bony abnormality, I feel that it is appropriate for outpatient follow-up.  Labs are unremarkable.  Discussed all of this with the patient and her family and they understand and agree with the plan Loleta Rose, MD 08/03 2042    ____________________________________________  FINAL CLINICAL IMPRESSION(S) / ED DIAGNOSES  Final diagnoses:  Acute bilateral low back pain with sciatica  Osteoarthritis of spine with radiculopathy, lumbar region     MEDICATIONS GIVEN DURING THIS VISIT:  Medications  HYDROcodone-acetaminophen (NORCO/VICODIN) 5-325 MG per tablet 2 tablet (2 tablets Oral Given 07/17/16 2057)     NEW OUTPATIENT MEDICATIONS STARTED DURING THIS VISIT:  Discharge Medication List as of 07/17/2016  8:51 PM    START taking these medications   Details  docusate sodium (COLACE) 100 MG capsule Take 1 tablet once or twice daily as needed for constipation while taking narcotic pain medicine, Print    HYDROcodone-acetaminophen (NORCO/VICODIN) 5-325 MG tablet Take 1-2 tablets by mouth every 4 (four) hours as needed for moderate pain., Starting Thu 07/17/2016, Print          Note:  This document was prepared using Dragon voice recognition software and may include unintentional dictation errors.    Loleta Rose, MD 07/17/16  2246  

## 2016-07-17 NOTE — ED Notes (Signed)
Patient education on use of walker. Visual demonstration given. All questions answered. No further needs at this time.

## 2016-07-17 NOTE — Discharge Instructions (Signed)
You have been seen in the Emergency Department (ED)  today for back pain.  Your workup and exam have not shown any acute abnormalities and you are likely suffering from muscle strain or possible problems with your discs, but there is no treatment that will fix your symptoms at this time.  Please take Motrin (ibuprofen) as needed for your pain according to the instructions written on the box.  Alternatively, for the next five days you can take 600mg three times daily with meals (it may upset your stomach). ° °Take Norco as prescribed for severe pain. Do not drink alcohol, drive or participate in any other potentially dangerous activities while taking this medication as it may make you sleepy. Do not take this medication with any other sedating medications, either prescription or over-the-counter. If you were prescribed Percocet or Vicodin, do not take these with acetaminophen (Tylenol) as it is already contained within these medications. °  °This medication is an opiate (or narcotic) pain medication and can be habit forming.  Use it as little as possible to achieve adequate pain control.  Do not use or use it with extreme caution if you have a history of opiate abuse or dependence.  If you are on a pain contract with your primary care doctor or a pain specialist, be sure to let them know you were prescribed this medication today from the [facility].  This medication is intended for your use only - do not give any to anyone else and keep it in a secure place where nobody else, especially children, have access to it.  It will also cause or worsen constipation, so you may want to consider taking an over-the-counter stool softener while you are taking this medication. ° °Please follow up with your doctor as soon as possible regarding today's ED visit and your back pain.  Return to the ED for worsening back pain, fever, weakness or numbness of either leg, or if you develop either (1) an  inability to urinate or have bowel movements, or (2) loss of your ability to control your bathroom functions (if you start having "accidents"), or if you develop other new symptoms that concern you. ° °

## 2016-08-21 ENCOUNTER — Other Ambulatory Visit: Payer: Self-pay | Admitting: Cardiology

## 2016-08-21 ENCOUNTER — Other Ambulatory Visit: Payer: Self-pay | Admitting: Physician Assistant

## 2016-08-21 ENCOUNTER — Other Ambulatory Visit: Payer: Self-pay | Admitting: Family Medicine

## 2016-08-22 NOTE — Telephone Encounter (Signed)
Per last A1c result pt declined endo referral. Is it ok to refill med as is or did you want to make changes?

## 2016-09-02 ENCOUNTER — Encounter: Payer: Self-pay | Admitting: Cardiology

## 2016-09-10 NOTE — Progress Notes (Signed)
HPI: FU hx of CAD status post coronary bypass graft and atrial fibrillation. Patient was admitted in Nov 2011 with atrial fibrillation with a rapid ventricular response. Troponin mildly increased. TSH normal. Echocardiogram in November of 2011 showed an ejection fraction of 45-50% with mild mitral regurgitation. Cardiac catheterization in Nov 2011 revealed an ejection fraction of 50%, severe three-vessel coronary artery disease status post 6-vessel coronary artery bypass graft with 6 patent bypass grafts. Severe disease in the proximal portion of the previously stented circumflex vessel that is unchanged from films in 2008 but is unprotected by a vein graft. Medical therapy was recommended. The patient previously declined Coumadin because of financial issues.   Since last seen, she has limited mobility. However she denies dyspnea, chest pain, palpitations or syncope. She does have chronic mild pedal edema.   Studies: - LHC (10/2010): 6 bypass grafts patent,Severe disease in the proximal portion of the previously stented circumflex vessel that is unchanged from films in 2008 but is unprotected by a vein graft. Medical therapy was recommended.  - Echo (10/2010): EF 45% to 50%. Diffuse hypokinesis. Mitral valve: Mild regurgitation. - Carotid US (5/16): 40-59% bilateral - repeat 1 year  Current Outpatient Prescriptions  Medication Sig Dispense Refill  . acetaminophen (TYLENOL) 500 MG tablet Take 500 mg by mouth every 6 (six) hours as needed for mild pain or headache.    Marland Kitchen aspirin EC 325 MG tablet Take 325 mg by mouth daily.     . cholecalciferol (VITAMIN D) 1000 UNITS tablet Take 1,000 Units by mouth daily.     . cyclobenzaprine (FLEXERIL) 5 MG tablet Take 1 tablet (5 mg total) by mouth 3 (three) times daily as needed. for muscle spams 20 tablet 0  . enalapril (VASOTEC) 20 MG tablet TAKE ONE TABLET BY MOUTH ONCE DAILY 90 tablet 0  . furosemide (LASIX) 40 MG tablet TAKE ONE TABLET BY  MOUTH ONCE DAILY 30 tablet 4  . glipiZIDE (GLUCOTROL) 5 MG tablet TAKE TWO TABLETS BY MOUTH TWICE DAILY BEFORE MEAL(S) 60 tablet 1  . HYDROcodone-acetaminophen (NORCO/VICODIN) 5-325 MG tablet Take 1-2 tablets by mouth every 4 (four) hours as needed for moderate pain. 30 tablet 0  . insulin NPH (HUMULIN N,NOVOLIN N) 100 UNIT/ML injection Inject 82 Units into the skin 2 (two) times daily. 5 vial 3  . metoprolol (LOPRESSOR) 50 MG tablet TAKE ONE & ONE-HALF TABLETS BY MOUTH TWICE DAILY 90 tablet 4  . Misc Natural Products (MIDNITE PM PO) Take by mouth. Sleep aid    . Multiple Vitamin (MULTIVITAMIN) capsule Take 1 capsule by mouth daily.      . nitroGLYCERIN (NITROSTAT) 0.4 MG SL tablet Place 1 tablet (0.4 mg total) under the tongue every 5 (five) minutes as needed for chest pain. 25 tablet 3   Current Facility-Administered Medications  Medication Dose Route Frequency Provider Last Rate Last Dose  . hepatitis A-hepatitis B (TWINRIX) injection 1 mL  1 mL Intramuscular Once Lori P Hvozdovic, PA-C         Past Medical History:  Diagnosis Date  . Anxiety   . Arthritis   . Atrial fibrillation (HCC)   . CAD (coronary artery disease)   . CHF (congestive heart failure) (HCC)   . COPD (chronic obstructive pulmonary disease) (HCC)   . DM (diabetes mellitus) (HCC)   . Gout   . HTN (hypertension)   . Hx of CABG   . Hyperlipidemia   . Kidney stones    x 3  .  MI (myocardial infarction)   . Obesity   . Vertigo     Past Surgical History:  Procedure Laterality Date  . CORONARY ARTERY BYPASS GRAFT  12/2001  . s/p dilatation and curettage    . TUBAL LIGATION      Social History   Social History  . Marital status: Married    Spouse name: N/A  . Number of children: 4  . Years of education: N/A   Occupational History  . Not on file.   Social History Main Topics  . Smoking status: Former Smoker    Types: Cigarettes    Quit date: 09/01/1991  . Smokeless tobacco: Never Used     Comment: has  a 30 pack year history, quit in late 1990s  . Alcohol use No  . Drug use: No  . Sexual activity: Not on file   Other Topics Concern  . Not on file   Social History Narrative  . No narrative on file    Family History  Problem Relation Age of Onset  . Hip fracture Mother     died at 5684 following hip fx  . Diabetes Mother     borderline  . Kidney disease Father     Bright's disease  . Diabetes Sister   . Lung cancer Maternal Uncle     x 2  . Breast cancer Maternal Aunt   . Crohn's disease Daughter   . Diabetes Maternal Grandmother   . Colon cancer Neg Hx     ROS: Back pain but no fevers or chills, productive cough, hemoptysis, dysphasia, odynophagia, melena, hematochezia, dysuria, hematuria, rash, seizure activity, orthopnea, PND, claudication. Remaining systems are negative.  Physical Exam: Well-developed obese in no acute distress.  Skin is warm and dry.  HEENT is normal.  Neck is supple.  Chest is clear to auscultation with normal expansion.  Cardiovascular exam is regular rate and rhythm.  Abdominal exam nontender or distended. No masses palpated. Extremities show 1+ ankle edema. neuro grossly intact  ECG-Sinus rhythm at a rate of 55. Left bundle branch block.  A/P  1 hyperlipidemia-patient did have elevated liver functions previously. I will try Lipitor 20 mg daily. Check lipids and liver in 4 weeks.  2 obesity-needs weight loss.  3 Hypertension-blood pressure controlled. Continue present medications.  4 coronary artery disease-continue aspirin. Resume statin. She is going to be evaluated for possible back surgery. She has limited mobility. If she indeed has this she will require stress testing preoperatively.  5 paroxysmal atrial fibrillation-no evidence of recurrence. Declines anticoagulation.  6 chronic kidney disease-check potassium and renal function. I have asked her to follow-up with nephrology for this.  7 carotid artery disease-schedule follow-up  carotid Dopplers.  Olga MillersBrian Crenshaw, MD

## 2016-09-15 ENCOUNTER — Encounter: Payer: Self-pay | Admitting: Cardiology

## 2016-09-15 ENCOUNTER — Ambulatory Visit (INDEPENDENT_AMBULATORY_CARE_PROVIDER_SITE_OTHER): Payer: PPO | Admitting: Cardiology

## 2016-09-15 VITALS — BP 112/72 | HR 55 | Ht 67.0 in

## 2016-09-15 DIAGNOSIS — I679 Cerebrovascular disease, unspecified: Secondary | ICD-10-CM

## 2016-09-15 DIAGNOSIS — I4891 Unspecified atrial fibrillation: Secondary | ICD-10-CM | POA: Diagnosis not present

## 2016-09-15 DIAGNOSIS — I2510 Atherosclerotic heart disease of native coronary artery without angina pectoris: Secondary | ICD-10-CM | POA: Diagnosis not present

## 2016-09-15 DIAGNOSIS — E785 Hyperlipidemia, unspecified: Secondary | ICD-10-CM

## 2016-09-15 MED ORDER — ATORVASTATIN CALCIUM 20 MG PO TABS: 20.0000 mg | ORAL_TABLET | Freq: Every day | ORAL | 12 refills | Status: DC

## 2016-09-15 NOTE — Patient Instructions (Signed)
Medication Instructions:   START ATORVASTATIN 20 MG ONCE DAILY  Labwork:  Your physician recommends that you return for lab work 4 WEEKS= DO NOT EAT PRIOR TO LAB WORK  Testing/Procedures:  Your physician has requested that you have a carotid duplex. This test is an ultrasound of the carotid arteries in your neck. It looks at blood flow through these arteries that supply the brain with blood. Allow one hour for this exam. There are no restrictions or special instructions.    Follow-Up:  Your physician wants you to follow-up in: ONE YEAR WITH DR Shelda Pal will receive a reminder letter in the mail two months in advance. If you don't receive a letter, please call our office to schedule the follow-up appointment.   If you need a refill on your cardiac medications before your next appointment, please call your pharmacy.

## 2016-09-17 ENCOUNTER — Telehealth: Payer: Self-pay | Admitting: Cardiology

## 2016-09-17 NOTE — Telephone Encounter (Signed)
New message    Pt c/o medication issue:  1. Name of Medication: lipitor   2. How are you currently taking this medication (dosage and times per day)? New medication 20 mg   3. Are you having a reaction (difficulty breathing--STAT)? No   4. What is your medication issue? What's the best time of day to take medication.

## 2016-09-17 NOTE — Telephone Encounter (Signed)
Recommendations given to take lipitor as typical (in evening). Pt voiced understanding and this is consistent w instructions she has received before. Aware to call if new questions or concerns.

## 2016-09-29 ENCOUNTER — Inpatient Hospital Stay (HOSPITAL_COMMUNITY): Admission: RE | Admit: 2016-09-29 | Payer: PPO | Source: Ambulatory Visit

## 2016-10-20 ENCOUNTER — Inpatient Hospital Stay (HOSPITAL_COMMUNITY): Admission: RE | Admit: 2016-10-20 | Payer: PPO | Source: Ambulatory Visit

## 2016-10-23 ENCOUNTER — Other Ambulatory Visit: Payer: Self-pay | Admitting: Family Medicine

## 2016-10-23 NOTE — Telephone Encounter (Signed)
Last a1c abnormal. pls advise 

## 2016-10-23 NOTE — Telephone Encounter (Signed)
Ok to refill one time only.  Needs a1c for further refills. 

## 2016-11-13 ENCOUNTER — Encounter: Payer: Self-pay | Admitting: *Deleted

## 2016-11-17 ENCOUNTER — Ambulatory Visit (INDEPENDENT_AMBULATORY_CARE_PROVIDER_SITE_OTHER): Payer: PPO

## 2016-11-17 VITALS — BP 118/74 | HR 61 | Temp 97.8°F

## 2016-11-17 DIAGNOSIS — Z23 Encounter for immunization: Secondary | ICD-10-CM

## 2016-11-17 DIAGNOSIS — E784 Other hyperlipidemia: Secondary | ICD-10-CM

## 2016-11-17 DIAGNOSIS — E7849 Other hyperlipidemia: Secondary | ICD-10-CM

## 2016-11-17 DIAGNOSIS — E1122 Type 2 diabetes mellitus with diabetic chronic kidney disease: Secondary | ICD-10-CM

## 2016-11-17 DIAGNOSIS — Z0000 Encounter for general adult medical examination without abnormal findings: Secondary | ICD-10-CM

## 2016-11-17 DIAGNOSIS — N183 Chronic kidney disease, stage 3 (moderate): Secondary | ICD-10-CM | POA: Diagnosis not present

## 2016-11-17 DIAGNOSIS — Z114 Encounter for screening for human immunodeficiency virus [HIV]: Secondary | ICD-10-CM | POA: Diagnosis not present

## 2016-11-17 DIAGNOSIS — D691 Qualitative platelet defects: Secondary | ICD-10-CM

## 2016-11-17 LAB — COMPREHENSIVE METABOLIC PANEL
ALBUMIN: 3.4 g/dL — AB (ref 3.5–5.2)
ALK PHOS: 124 U/L — AB (ref 39–117)
ALT: 10 U/L (ref 0–35)
AST: 16 U/L (ref 0–37)
BILIRUBIN TOTAL: 0.5 mg/dL (ref 0.2–1.2)
BUN: 50 mg/dL — ABNORMAL HIGH (ref 6–23)
CALCIUM: 9.3 mg/dL (ref 8.4–10.5)
CO2: 30 mEq/L (ref 19–32)
Chloride: 104 mEq/L (ref 96–112)
Creatinine, Ser: 1.43 mg/dL — ABNORMAL HIGH (ref 0.40–1.20)
GFR: 39.06 mL/min — AB (ref >60.00)
Glucose, Bld: 86 mg/dL (ref 70–99)
POTASSIUM: 4.8 meq/L (ref 3.5–5.1)
Sodium: 142 mEq/L (ref 135–145)
TOTAL PROTEIN: 7.3 g/dL (ref 6.0–8.3)

## 2016-11-17 LAB — HEMOGLOBIN A1C: HEMOGLOBIN A1C: 7.4 % — AB (ref 4.6–6.5)

## 2016-11-17 LAB — LIPID PANEL
Cholesterol: 101 mg/dL (ref 0–200)
HDL: 33.7 mg/dL — AB (ref >39.00)
LDL Cholesterol: 46 mg/dL (ref 0–99)
NONHDL: 67.44
TRIGLYCERIDES: 106 mg/dL (ref 0.0–149.0)
Total CHOL/HDL Ratio: 3
VLDL: 21.2 mg/dL (ref 0.0–40.0)

## 2016-11-17 LAB — CBC WITH DIFFERENTIAL/PLATELET
BASOS ABS: 0 10*3/uL (ref 0.0–0.1)
Basophils Relative: 0.2 % (ref 0.0–3.0)
EOS ABS: 0.1 10*3/uL (ref 0.0–0.7)
Eosinophils Relative: 2.1 % (ref 0.0–5.0)
HEMATOCRIT: 35.7 % — AB (ref 36.0–46.0)
Hemoglobin: 11.3 g/dL — ABNORMAL LOW (ref 12.0–15.0)
LYMPHS PCT: 20.1 % (ref 12.0–46.0)
Lymphs Abs: 1.4 10*3/uL (ref 0.7–4.0)
MCHC: 31.5 g/dL (ref 30.0–36.0)
MCV: 86.2 fl (ref 78.0–100.0)
MONOS PCT: 5.6 % (ref 3.0–12.0)
Monocytes Absolute: 0.4 10*3/uL (ref 0.1–1.0)
Neutro Abs: 5.2 10*3/uL (ref 1.4–7.7)
Neutrophils Relative %: 72 % (ref 43.0–77.0)
Platelets: 119 10*3/uL — ABNORMAL LOW (ref 150.0–400.0)
RBC: 4.15 Mil/uL (ref 3.87–5.11)
RDW: 17.6 % — ABNORMAL HIGH (ref 11.5–15.5)
WBC: 7.2 10*3/uL (ref 4.0–10.5)

## 2016-11-17 NOTE — Progress Notes (Signed)
Subjective:   Cassandra Fisher is a 65 y.o. female who presents for an Initial Medicare Annual Wellness Visit.  Review of Systems    N/A  Cardiac Risk Factors include: obesity (BMI >30kg/m2);advanced age (>6755men, 38>65 women);diabetes mellitus;hypertension;dyslipidemia     Objective:    Today's Vitals   11/17/16 1310  BP: 118/74  Pulse: 61  Temp: 97.8 F (36.6 C)  TempSrc: Oral  SpO2: 97%  PainSc: 0-No pain   There is no height or weight on file to calculate BMI.   Current Medications (verified) Outpatient Encounter Prescriptions as of 11/17/2016  Medication Sig  . acetaminophen (TYLENOL) 500 MG tablet Take 500 mg by mouth every 6 (six) hours as needed for mild pain or headache.  Marland Kitchen. aspirin EC 325 MG tablet Take 325 mg by mouth daily.   Marland Kitchen. atorvastatin (LIPITOR) 20 MG tablet Take 1 tablet (20 mg total) by mouth daily.  . cholecalciferol (VITAMIN D) 1000 UNITS tablet Take 1,000 Units by mouth daily.   . cyclobenzaprine (FLEXERIL) 5 MG tablet Take 1 tablet (5 mg total) by mouth 3 (three) times daily as needed. for muscle spams  . enalapril (VASOTEC) 20 MG tablet TAKE ONE TABLET BY MOUTH ONCE DAILY  . furosemide (LASIX) 40 MG tablet TAKE ONE TABLET BY MOUTH ONCE DAILY  . glipiZIDE (GLUCOTROL) 5 MG tablet Take 1 tablet (5 mg total) by mouth 2 (two) times daily before a meal. OV WITH LABS REQUIRED FOR ANY ADDITIONAL REFILLS per dr Dayton Martesaron  . insulin NPH (HUMULIN N,NOVOLIN N) 100 UNIT/ML injection Inject 82 Units into the skin 2 (two) times daily.  . metoprolol (LOPRESSOR) 50 MG tablet TAKE ONE & ONE-HALF TABLETS BY MOUTH TWICE DAILY  . Misc Natural Products (MIDNITE PM PO) Take by mouth. Sleep aid  . Multiple Vitamin (MULTIVITAMIN) capsule Take 1 capsule by mouth daily.    . nitroGLYCERIN (NITROSTAT) 0.4 MG SL tablet Place 1 tablet (0.4 mg total) under the tongue every 5 (five) minutes as needed for chest pain.  . [DISCONTINUED] HYDROcodone-acetaminophen (NORCO/VICODIN) 5-325 MG tablet  Take 1-2 tablets by mouth every 4 (four) hours as needed for moderate pain.   Facility-Administered Encounter Medications as of 11/17/2016  Medication  . hepatitis A-hepatitis B (TWINRIX) injection 1 mL    Allergies (verified) Colcrys [colchicine]; Metformin and related; Pravastatin; Sulfa antibiotics; and Zocor [simvastatin]   History: Past Medical History:  Diagnosis Date  . Anxiety   . Arthritis   . Atrial fibrillation (HCC)   . CAD (coronary artery disease)   . CHF (congestive heart failure) (HCC)   . COPD (chronic obstructive pulmonary disease) (HCC)   . DM (diabetes mellitus) (HCC)   . Gout   . HTN (hypertension)   . Hx of CABG   . Hyperlipidemia   . Kidney stones    x 3  . MI (myocardial infarction)   . Obesity   . Vertigo    Past Surgical History:  Procedure Laterality Date  . CORONARY ARTERY BYPASS GRAFT  12/2001  . s/p dilatation and curettage    . TUBAL LIGATION     Family History  Problem Relation Age of Onset  . Hip fracture Mother     died at 2784 following hip fx  . Diabetes Mother     borderline  . Kidney disease Father     Bright's disease  . Diabetes Sister   . Lung cancer Maternal Uncle     x 2  . Breast cancer Maternal Aunt   .  Crohn's disease Daughter   . Diabetes Maternal Grandmother   . Colon cancer Neg Hx    Social History   Occupational History  . Not on file.   Social History Main Topics  . Smoking status: Former Smoker    Types: Cigarettes    Quit date: 09/01/1991  . Smokeless tobacco: Never Used     Comment: has a 30 pack year history, quit in late 1990s  . Alcohol use No  . Drug use: No  . Sexual activity: No    Tobacco Counseling Counseling given: No   Activities of Daily Living In your present state of health, do you have any difficulty performing the following activities: 11/17/2016  Hearing? N  Vision? N  Difficulty concentrating or making decisions? N  Walking or climbing stairs? Y  Dressing or bathing? N    Doing errands, shopping? Y  Preparing Food and eating ? N  Using the Toilet? N  In the past six months, have you accidently leaked urine? N  Do you have problems with loss of bowel control? N  Managing your Medications? N  Managing your Finances? N  Housekeeping or managing your Housekeeping? N  Some recent data might be hidden    Immunizations and Health Maintenance Immunization History  Administered Date(s) Administered  . Hep A / Hep B 04/09/2015, 10/10/2015  . Influenza,inj,Quad PF,36+ Mos 09/06/2013, 10/25/2014, 10/11/2015, 11/17/2016  . Pneumococcal Conjugate-13 11/17/2016  . Pneumococcal Polysaccharide-23 11/20/2015  . Zoster 11/20/2015   There are no preventive care reminders to display for this patient.  Patient Care Team: Dianne Dun, MD as PCP - General (Family Medicine) Dianne Dun, MD (Family Medicine) Dr. Jens Som - Cardiology    Assessment:   This is a routine wellness examination for [place].   Hearing/Vision screen  Hearing Screening   125Hz  250Hz  500Hz  1000Hz  2000Hz  3000Hz  4000Hz  6000Hz  8000Hz   Right ear:   0 0 40  40    Left ear:   0 0 40  40      Visual Acuity Screening   Right eye Left eye Both eyes  Without correction: 20/50 20/25-1 20/25-1  With correction:       Dietary issues and exercise activities discussed: Current Exercise Habits: The patient does not participate in regular exercise at present, Exercise limited by: orthopedic condition(s)  Goals    . Increase water intake          Starting 11/17/2016, I will continue to drink at least 6-8 glasses of flavored water daily.       Depression Screen PHQ 2/9 Scores 11/17/2016  PHQ - 2 Score 1    Fall Risk Fall Risk  11/17/2016  Falls in the past year? Yes  Number falls in past yr: 1  Injury with Fall? No  Follow up Falls evaluation completed    Cognitive Function: MMSE - Mini Mental State Exam 11/17/2016  Orientation to time 5  Orientation to Place 5  Registration 3  Attention/  Calculation 0  Recall 3  Language- name 2 objects 0  Language- repeat 1  Language- follow 3 step command 3  Language- read & follow direction 0  Write a sentence 0  Copy design 0  Total score 20     PLEASE NOTE: A Mini-Cog screen was completed. Maximum score is 20. A value of 0 denotes this part of Folstein MMSE was not completed or the patient failed this part of the Mini-Cog screening.   Mini-Cog Screening Orientation to Time -  Max 5 pts Orientation to Place - Max 5 pts Registration - Max 3 pts Recall - Max 3 pts Language Repeat - Max 1 pts Language Follow 3 Step Command - Max 3 pts     Screening Tests Health Maintenance  Topic Date Due  . FOOT EXAM  11/24/2016 (Originally 10/10/2016)  . MAMMOGRAM  05/17/2017 (Originally 05/19/2015)  . DEXA SCAN  05/17/2017 (Originally 03/22/2016)  . OPHTHALMOLOGY EXAM  11/17/2017 (Originally 03/22/1961)  . PAP SMEAR  09/06/2019 (Originally 09/06/2016)  . COLONOSCOPY  11/17/2026 (Originally 03/22/2001)  . TETANUS/TDAP  11/17/2026 (Originally 03/22/1970)  . HEMOGLOBIN A1C  05/18/2017  . PNA vac Low Risk Adult (2 of 2 - PPSV23) 11/19/2020  . INFLUENZA VACCINE  Completed  . ZOSTAVAX  Completed  . Hepatitis C Screening  Completed  . HIV Screening  Completed      Plan:     I have personally reviewed and addressed the Medicare Annual Wellness questionnaire and have noted the following in the patient's chart:  A. Medical and social history B. Use of alcohol, tobacco or illicit drugs  C. Current medications and supplements D. Functional ability and status E.  Nutritional status F.  Physical activity G. Advance directives H. List of other physicians I.  Hospitalizations, surgeries, and ER visits in previous 12 months J.  Vitals K. Screenings to include hearing, vision, cognitive, depression L. Referrals and appointments - none  In addition, I have reviewed and discussed with patient certain preventive protocols, quality metrics, and best  practice recommendations. A written personalized care plan for preventive services as well as general preventive health recommendations were provided to patient.  See attached scanned questionnaire for additional information.   Signed,   Randa Evens, MHA, BS, LPN Health Coach

## 2016-11-17 NOTE — Patient Instructions (Addendum)
[name] , Thank you for taking time to come for your Medicare Wellness Visit. I appreciate your ongoing commitment to your health goals. Please review the following plan we discussed and let me know if I can assist you in the future.   These are the goals we discussed: Goals    . Increase water intake          Starting 11/17/2016, I will continue to drink at least 6-8 glasses of flavored water daily.        This is a list of the screening recommended for you and due dates:  Health Maintenance  Topic Date Due  . Complete foot exam   11/24/2016*  . Mammogram  05/17/2017*  . DEXA scan (bone density measurement)  05/17/2017*  . Eye exam for diabetics  11/17/2017*  . Pap Smear  09/06/2019*  . Colon Cancer Screening  11/17/2026*  . Tetanus Vaccine  11/17/2026*  . Hemoglobin A1C  05/18/2017  . Pneumonia vaccines (2 of 2 - PPSV23) 11/19/2020  . Flu Shot  Completed  . Shingles Vaccine  Completed  .  Hepatitis C: One time screening is recommended by Center for Disease Control  (CDC) for  adults born from [birth year] through 1965.   Completed  . HIV Screening  Completed  *Topic was postponed. The date shown is not the original due date.   Preventive Care for Adults  A healthy lifestyle and preventive care can promote health and wellness. Preventive health guidelines for adults include the following key practices.  . A routine yearly physical is a good way to check with your health care provider about your health and preventive screening. It is a chance to share any concerns and updates on your health and to receive a thorough exam.  . Visit your dentist for a routine exam and preventive care every 6 months. Brush your teeth twice a day and floss once a day. Good oral hygiene prevents tooth decay and gum disease.  . The frequency of eye exams is based on your age, health, family medical history, use  of contact lenses, and other factors. Follow your health care provider's ecommendations for  frequency of eye exams.  . Eat a healthy diet. Foods like vegetables, fruits, whole grains, low-fat dairy products, and lean protein foods contain the nutrients you need without too many calories. Decrease your intake of foods high in solid fats, added sugars, and salt. Eat the right amount of calories for you. Get information about a proper diet from your health care provider, if necessary.  . Regular physical exercise is one of the most important things you can do for your health. Most adults should get at least 150 minutes of moderate-intensity exercise (any activity that increases your heart rate and causes you to sweat) each week. In addition, most adults need muscle-strengthening exercises on 2 or more days a week.  Silver Sneakers may be a benefit available to you. To determine eligibility, you may visit the website: www.silversneakers.com or contact program at [phone] Mon-Fri between 8AM-8PM.   . Maintain a healthy weight. The body mass index (BMI) is a screening tool to identify possible weight problems. It provides an estimate of body fat based on height and weight. Your health care provider can find your BMI and can help you achieve or maintain a healthy weight.   For adults 20 years and older: ? A BMI below 18.5 is considered underweight. ? A BMI of 18.5 to 24.9 is normal. ? A BMI  of 25 to 29.9 is considered overweight. ? A BMI of 30 and above is considered obese.   . Maintain normal blood lipids and cholesterol levels by exercising and minimizing your intake of saturated fat. Eat a balanced diet with plenty of fruit and vegetables. Blood tests for lipids and cholesterol should begin at age 40 and be repeated every 5 years. If your lipid or cholesterol levels are high, you are over 50, or you are at high risk for heart disease, you may need your cholesterol levels checked more frequently. Ongoing high lipid and cholesterol levels should be treated with medicines if diet and  exercise are not working.  . If you smoke, find out from your health care provider how to quit. If you do not use tobacco, please do not start.  . If you choose to drink alcohol, please do not consume more than 2 drinks per day. One drink is considered to be 12 ounces (355 mL) of beer, 5 ounces (148 mL) of wine, or 1.5 ounces (44 mL) of liquor.  . If you are 61-89 years old, ask your health care provider if you should take aspirin to prevent strokes.  . Use sunscreen. Apply sunscreen liberally and repeatedly throughout the day. You should seek shade when your shadow is shorter than you. Protect yourself by wearing long sleeves, pants, a wide-brimmed hat, and sunglasses year round, whenever you are outdoors.  . Once a month, do a whole body skin exam, using a mirror to look at the skin on your back. Tell your health care provider of new moles, moles that have irregular borders, moles that are larger than a pencil eraser, or moles that have changed in shape or color.

## 2016-11-17 NOTE — Progress Notes (Signed)
Pre visit review using our clinic review tool, if applicable. No additional management support is needed unless otherwise documented below in the visit note. 

## 2016-11-17 NOTE — Progress Notes (Signed)
PCP notes:   Health maintenance:  Flu vaccine - completed PCV13 - completed HIV screening - completed A1C - completed Tetanus - postponed/insurance Colon cancer screening - ordered Cologuard Eye exam - pt has not seen provider in 10 yrs Bone density - pt will complete in 2018 Mammogram - pt will complete in 2018 Foot exam - pt will complete with PCP at next appt PAP smear - pt declined  Abnormal screenings:   Hearing - failed Fall risk - hx of fall without injury  Depression score: 1  Patient concerns:   None  Nurse concerns:  None  Next PCP appt:   11/24/2016 @ 1200

## 2016-11-18 LAB — HIV ANTIBODY (ROUTINE TESTING W REFLEX): HIV 1&2 Ab, 4th Generation: NONREACTIVE

## 2016-11-18 NOTE — Progress Notes (Signed)
I reviewed health advisor's note, was available for consultation, and agree with documentation and plan.  

## 2016-11-24 ENCOUNTER — Other Ambulatory Visit: Payer: Self-pay

## 2016-11-24 ENCOUNTER — Encounter: Payer: PPO | Admitting: Family Medicine

## 2016-11-24 ENCOUNTER — Telehealth: Payer: Self-pay | Admitting: Cardiology

## 2016-11-24 DIAGNOSIS — Z0289 Encounter for other administrative examinations: Secondary | ICD-10-CM

## 2016-11-24 MED ORDER — GLIPIZIDE 5 MG PO TABS: 5.0000 mg | ORAL_TABLET | Freq: Two times a day (BID) | ORAL | 0 refills | Status: AC

## 2016-11-24 NOTE — Telephone Encounter (Signed)
Dc lipitor; crestor 40 mg daily; lipids and liver 4 weeks Olga Millers

## 2016-11-24 NOTE — Telephone Encounter (Signed)
Spoke with pt, she reports she has taken crestor in the past and had problems with it as week. She is not interested in taking a statin right now. She has a follow up appointment in January and will discuss with dr Jens Som at that time.

## 2016-11-24 NOTE — Telephone Encounter (Signed)
New Message  Pt c/o medication issue:  1. Name of Medication: metoprolol (Lopressor) 50 mg tablet take one and one-half tablets twice daily.  2. How are you currently taking this medication (dosage and times per day)? See above  3. Are you having a reaction (difficulty breathing--STAT)? Yes  4. What is your medication issue? Difficulty breathing, last time medication taken was last night

## 2016-11-24 NOTE — Telephone Encounter (Signed)
Spoke with patient who is reporting increased SOB only after taking atorvastatin that was recently started.  She reports taking the medication every night at 6 pm and about 30 mins later she has trouble taking a deep breath.  Last night she reports the SOB lasted all night and she wasn't able to sleep because of it.  She denies any wt gain, reports edema at feet and ankles is the same as it always is and no CP.  Advised to hold atorvastatin and if Dr Jens Som wants to try her on a different medication for her cholesterol, she will be contacted.  Pt states understanding.

## 2016-11-24 NOTE — Telephone Encounter (Signed)
crestor 20 mg daily, not 40 [id number]

## 2016-11-24 NOTE — Telephone Encounter (Signed)
Pt request glipizide to [facility] [address]. Last seen 06/03/16; pt has CPX scheduled 12/22/16. Pt cancelled CPX appt today due to w/c ramp covered in ice. Refill done per protocol and pt voiced understanding.

## 2016-11-25 ENCOUNTER — Other Ambulatory Visit: Payer: Self-pay | Admitting: Cardiology

## 2016-11-25 NOTE — Telephone Encounter (Signed)
REFILL 

## 2016-12-05 ENCOUNTER — Encounter (HOSPITAL_COMMUNITY): Payer: Self-pay

## 2016-12-05 ENCOUNTER — Inpatient Hospital Stay (HOSPITAL_COMMUNITY)
Admission: EM | Admit: 2016-12-05 | Discharge: 2016-12-15 | DRG: 682 | Disposition: E | Payer: PPO | Attending: Internal Medicine | Admitting: Internal Medicine

## 2016-12-05 ENCOUNTER — Observation Stay (HOSPITAL_COMMUNITY): Payer: PPO

## 2016-12-05 ENCOUNTER — Emergency Department (HOSPITAL_COMMUNITY): Payer: PPO

## 2016-12-05 DIAGNOSIS — N179 Acute kidney failure, unspecified: Secondary | ICD-10-CM | POA: Diagnosis present

## 2016-12-05 DIAGNOSIS — R7989 Other specified abnormal findings of blood chemistry: Secondary | ICD-10-CM

## 2016-12-05 DIAGNOSIS — E1129 Type 2 diabetes mellitus with other diabetic kidney complication: Secondary | ICD-10-CM | POA: Diagnosis present

## 2016-12-05 DIAGNOSIS — N170 Acute kidney failure with tubular necrosis: Secondary | ICD-10-CM | POA: Diagnosis not present

## 2016-12-05 DIAGNOSIS — I2510 Atherosclerotic heart disease of native coronary artery without angina pectoris: Secondary | ICD-10-CM | POA: Diagnosis present

## 2016-12-05 DIAGNOSIS — Z794 Long term (current) use of insulin: Secondary | ICD-10-CM

## 2016-12-05 DIAGNOSIS — I447 Left bundle-branch block, unspecified: Secondary | ICD-10-CM | POA: Diagnosis present

## 2016-12-05 DIAGNOSIS — N183 Chronic kidney disease, stage 3 unspecified: Secondary | ICD-10-CM | POA: Diagnosis present

## 2016-12-05 DIAGNOSIS — Z5329 Procedure and treatment not carried out because of patient's decision for other reasons: Secondary | ICD-10-CM | POA: Diagnosis present

## 2016-12-05 DIAGNOSIS — J9622 Acute and chronic respiratory failure with hypercapnia: Secondary | ICD-10-CM | POA: Diagnosis not present

## 2016-12-05 DIAGNOSIS — Z888 Allergy status to other drugs, medicaments and biological substances status: Secondary | ICD-10-CM

## 2016-12-05 DIAGNOSIS — E1122 Type 2 diabetes mellitus with diabetic chronic kidney disease: Secondary | ICD-10-CM | POA: Diagnosis present

## 2016-12-05 DIAGNOSIS — R627 Adult failure to thrive: Secondary | ICD-10-CM | POA: Diagnosis present

## 2016-12-05 DIAGNOSIS — E785 Hyperlipidemia, unspecified: Secondary | ICD-10-CM | POA: Diagnosis present

## 2016-12-05 DIAGNOSIS — Z882 Allergy status to sulfonamides status: Secondary | ICD-10-CM

## 2016-12-05 DIAGNOSIS — E872 Acidosis: Secondary | ICD-10-CM | POA: Diagnosis present

## 2016-12-05 DIAGNOSIS — Z803 Family history of malignant neoplasm of breast: Secondary | ICD-10-CM

## 2016-12-05 DIAGNOSIS — R5383 Other fatigue: Secondary | ICD-10-CM

## 2016-12-05 DIAGNOSIS — I248 Other forms of acute ischemic heart disease: Secondary | ICD-10-CM | POA: Diagnosis present

## 2016-12-05 DIAGNOSIS — I130 Hypertensive heart and chronic kidney disease with heart failure and stage 1 through stage 4 chronic kidney disease, or unspecified chronic kidney disease: Secondary | ICD-10-CM | POA: Diagnosis present

## 2016-12-05 DIAGNOSIS — Z801 Family history of malignant neoplasm of trachea, bronchus and lung: Secondary | ICD-10-CM

## 2016-12-05 DIAGNOSIS — J441 Chronic obstructive pulmonary disease with (acute) exacerbation: Secondary | ICD-10-CM | POA: Diagnosis present

## 2016-12-05 DIAGNOSIS — I10 Essential (primary) hypertension: Secondary | ICD-10-CM | POA: Diagnosis present

## 2016-12-05 DIAGNOSIS — R0902 Hypoxemia: Secondary | ICD-10-CM

## 2016-12-05 DIAGNOSIS — Z833 Family history of diabetes mellitus: Secondary | ICD-10-CM

## 2016-12-05 DIAGNOSIS — J9621 Acute and chronic respiratory failure with hypoxia: Secondary | ICD-10-CM | POA: Diagnosis not present

## 2016-12-05 DIAGNOSIS — M1990 Unspecified osteoarthritis, unspecified site: Secondary | ICD-10-CM | POA: Diagnosis present

## 2016-12-05 DIAGNOSIS — E1121 Type 2 diabetes mellitus with diabetic nephropathy: Secondary | ICD-10-CM | POA: Diagnosis present

## 2016-12-05 DIAGNOSIS — E876 Hypokalemia: Secondary | ICD-10-CM | POA: Diagnosis not present

## 2016-12-05 DIAGNOSIS — E875 Hyperkalemia: Secondary | ICD-10-CM | POA: Diagnosis present

## 2016-12-05 DIAGNOSIS — Z66 Do not resuscitate: Secondary | ICD-10-CM | POA: Diagnosis present

## 2016-12-05 DIAGNOSIS — Z951 Presence of aortocoronary bypass graft: Secondary | ICD-10-CM

## 2016-12-05 DIAGNOSIS — I252 Old myocardial infarction: Secondary | ICD-10-CM

## 2016-12-05 DIAGNOSIS — D696 Thrombocytopenia, unspecified: Secondary | ICD-10-CM | POA: Diagnosis present

## 2016-12-05 DIAGNOSIS — Z7189 Other specified counseling: Secondary | ICD-10-CM

## 2016-12-05 DIAGNOSIS — R778 Other specified abnormalities of plasma proteins: Secondary | ICD-10-CM | POA: Diagnosis present

## 2016-12-05 DIAGNOSIS — E119 Type 2 diabetes mellitus without complications: Secondary | ICD-10-CM | POA: Insufficient documentation

## 2016-12-05 DIAGNOSIS — D649 Anemia, unspecified: Secondary | ICD-10-CM | POA: Diagnosis present

## 2016-12-05 DIAGNOSIS — N19 Unspecified kidney failure: Secondary | ICD-10-CM

## 2016-12-05 DIAGNOSIS — Z6841 Body Mass Index (BMI) 40.0 and over, adult: Secondary | ICD-10-CM

## 2016-12-05 DIAGNOSIS — Z7982 Long term (current) use of aspirin: Secondary | ICD-10-CM

## 2016-12-05 DIAGNOSIS — F411 Generalized anxiety disorder: Secondary | ICD-10-CM | POA: Diagnosis present

## 2016-12-05 DIAGNOSIS — Z515 Encounter for palliative care: Secondary | ICD-10-CM | POA: Diagnosis present

## 2016-12-05 DIAGNOSIS — I5023 Acute on chronic systolic (congestive) heart failure: Secondary | ICD-10-CM | POA: Diagnosis present

## 2016-12-05 DIAGNOSIS — E611 Iron deficiency: Secondary | ICD-10-CM | POA: Diagnosis present

## 2016-12-05 DIAGNOSIS — I429 Cardiomyopathy, unspecified: Secondary | ICD-10-CM

## 2016-12-05 DIAGNOSIS — M109 Gout, unspecified: Secondary | ICD-10-CM | POA: Diagnosis present

## 2016-12-05 DIAGNOSIS — R040 Epistaxis: Secondary | ICD-10-CM | POA: Diagnosis present

## 2016-12-05 DIAGNOSIS — Z87891 Personal history of nicotine dependence: Secondary | ICD-10-CM

## 2016-12-05 DIAGNOSIS — Z87442 Personal history of urinary calculi: Secondary | ICD-10-CM

## 2016-12-05 DIAGNOSIS — R05 Cough: Secondary | ICD-10-CM | POA: Diagnosis not present

## 2016-12-05 DIAGNOSIS — I480 Paroxysmal atrial fibrillation: Secondary | ICD-10-CM | POA: Diagnosis present

## 2016-12-05 LAB — CBC WITH DIFFERENTIAL/PLATELET
Basophils Absolute: 0 10*3/uL (ref 0.0–0.1)
Basophils Relative: 0 %
EOS PCT: 1 %
Eosinophils Absolute: 0.1 10*3/uL (ref 0.0–0.7)
HCT: 34.5 % — ABNORMAL LOW (ref 36.0–46.0)
Hemoglobin: 10.6 g/dL — ABNORMAL LOW (ref 12.0–15.0)
LYMPHS ABS: 0.8 10*3/uL (ref 0.7–4.0)
LYMPHS PCT: 9 %
MCH: 26.4 pg (ref 26.0–34.0)
MCHC: 30.7 g/dL (ref 30.0–36.0)
MCV: 85.8 fL (ref 78.0–100.0)
Monocytes Absolute: 0.4 10*3/uL (ref 0.1–1.0)
Monocytes Relative: 4 %
Neutro Abs: 7.6 10*3/uL (ref 1.7–7.7)
Neutrophils Relative %: 86 %
PLATELETS: 132 10*3/uL — AB (ref 150–400)
RBC: 4.02 MIL/uL (ref 3.87–5.11)
RDW: 16.6 % — AB (ref 11.5–15.5)
WBC: 8.8 10*3/uL (ref 4.0–10.5)

## 2016-12-05 LAB — COMPREHENSIVE METABOLIC PANEL
ALBUMIN: 2.5 g/dL — AB (ref 3.5–5.0)
ALT: 19 U/L (ref 14–54)
AST: 22 U/L (ref 15–41)
Alkaline Phosphatase: 177 U/L — ABNORMAL HIGH (ref 38–126)
Anion gap: 11 (ref 5–15)
BILIRUBIN TOTAL: 0.5 mg/dL (ref 0.3–1.2)
BUN: 139 mg/dL — AB (ref 6–20)
CHLORIDE: 97 mmol/L — AB (ref 101–111)
CO2: 22 mmol/L (ref 22–32)
CREATININE: 3.12 mg/dL — AB (ref 0.44–1.00)
Calcium: 8.3 mg/dL — ABNORMAL LOW (ref 8.9–10.3)
GFR calc Af Amer: 17 mL/min — ABNORMAL LOW (ref >60)
GFR, EST NON AFRICAN AMERICAN: 15 mL/min — AB (ref >60)
GLUCOSE: 142 mg/dL — AB (ref 65–99)
POTASSIUM: 5.9 mmol/L — AB (ref 3.5–5.1)
Sodium: 130 mmol/L — ABNORMAL LOW (ref 135–145)
TOTAL PROTEIN: 6.8 g/dL (ref 6.5–8.1)

## 2016-12-05 LAB — CREATININE, SERUM
Creatinine, Ser: 2.96 mg/dL — ABNORMAL HIGH (ref 0.44–1.00)
GFR calc non Af Amer: 16 mL/min — ABNORMAL LOW (ref >60)
GFR, EST AFRICAN AMERICAN: 18 mL/min — AB (ref >60)

## 2016-12-05 LAB — GLUCOSE, CAPILLARY
GLUCOSE-CAPILLARY: 89 mg/dL (ref 65–99)
Glucose-Capillary: 94 mg/dL (ref 65–99)

## 2016-12-05 LAB — MAGNESIUM: MAGNESIUM: 2.9 mg/dL — AB (ref 1.7–2.4)

## 2016-12-05 LAB — BRAIN NATRIURETIC PEPTIDE: B Natriuretic Peptide: 991.2 pg/mL — ABNORMAL HIGH (ref 0.0–100.0)

## 2016-12-05 LAB — TROPONIN I
TROPONIN I: 0.03 ng/mL — AB (ref <0.03)
TROPONIN I: 0.04 ng/mL — AB (ref <0.03)
Troponin I: 0.04 ng/mL (ref <0.03)

## 2016-12-05 LAB — POTASSIUM: Potassium: 6 mmol/L — ABNORMAL HIGH (ref 3.5–5.1)

## 2016-12-05 LAB — PHOSPHORUS: PHOSPHORUS: 7 mg/dL — AB (ref 2.5–4.6)

## 2016-12-05 MED ORDER — METOPROLOL TARTRATE 50 MG PO TABS
50.0000 mg | ORAL_TABLET | Freq: Two times a day (BID) | ORAL | Status: DC
  Administered 2016-12-05: 50 mg via ORAL
  Filled 2016-12-05: qty 1

## 2016-12-05 MED ORDER — FUROSEMIDE 10 MG/ML IJ SOLN
40.0000 mg | Freq: Two times a day (BID) | INTRAMUSCULAR | Status: DC
  Administered 2016-12-05 – 2016-12-06 (×2): 40 mg via INTRAVENOUS
  Filled 2016-12-05 (×2): qty 4

## 2016-12-05 MED ORDER — INSULIN ASPART 100 UNIT/ML ~~LOC~~ SOLN
0.0000 [IU] | Freq: Three times a day (TID) | SUBCUTANEOUS | Status: DC
Start: 2016-12-05 — End: 2016-12-05

## 2016-12-05 MED ORDER — SODIUM CHLORIDE 0.9 % IV SOLN: 250.0000 mL | INTRAVENOUS | Status: DC | PRN

## 2016-12-05 MED ORDER — ACETAMINOPHEN 325 MG PO TABS
650.0000 mg | ORAL_TABLET | ORAL | Status: DC | PRN
  Administered 2016-12-07 – 2016-12-09 (×3): 650 mg via ORAL
  Filled 2016-12-05 (×3): qty 2

## 2016-12-05 MED ORDER — NITROGLYCERIN 0.4 MG SL SUBL: 0.4000 mg | SUBLINGUAL_TABLET | SUBLINGUAL | Status: DC | PRN

## 2016-12-05 MED ORDER — ALBUTEROL SULFATE (2.5 MG/3ML) 0.083% IN NEBU
2.5000 mg | INHALATION_SOLUTION | Freq: Three times a day (TID) | RESPIRATORY_TRACT | Status: DC
  Administered 2016-12-06 – 2016-12-08 (×4): 2.5 mg via RESPIRATORY_TRACT
  Filled 2016-12-05 (×5): qty 3

## 2016-12-05 MED ORDER — METHYLPREDNISOLONE SODIUM SUCC 125 MG IJ SOLR
125.0000 mg | Freq: Once | INTRAMUSCULAR | Status: AC
  Administered 2016-12-05: 125 mg via INTRAVENOUS
  Filled 2016-12-05: qty 2

## 2016-12-05 MED ORDER — ALBUTEROL SULFATE (2.5 MG/3ML) 0.083% IN NEBU
2.5000 mg | INHALATION_SOLUTION | RESPIRATORY_TRACT | Status: DC
  Filled 2016-12-05: qty 3

## 2016-12-05 MED ORDER — ASPIRIN EC 325 MG PO TBEC
325.0000 mg | DELAYED_RELEASE_TABLET | Freq: Every day | ORAL | Status: DC
  Administered 2016-12-05 – 2016-12-08 (×4): 325 mg via ORAL
  Filled 2016-12-05 (×4): qty 1

## 2016-12-05 MED ORDER — SODIUM CHLORIDE 0.9% FLUSH: 3.0000 mL | INTRAVENOUS | Status: DC | PRN

## 2016-12-05 MED ORDER — SODIUM CHLORIDE 0.9% FLUSH
3.0000 mL | Freq: Two times a day (BID) | INTRAVENOUS | Status: DC
  Administered 2016-12-05 – 2016-12-12 (×13): 3 mL via INTRAVENOUS

## 2016-12-05 MED ORDER — ALBUTEROL SULFATE (2.5 MG/3ML) 0.083% IN NEBU: 2.5000 mg | INHALATION_SOLUTION | RESPIRATORY_TRACT | Status: DC | PRN

## 2016-12-05 MED ORDER — INSULIN ASPART 100 UNIT/ML ~~LOC~~ SOLN
0.0000 [IU] | Freq: Three times a day (TID) | SUBCUTANEOUS | Status: DC
  Administered 2016-12-06: 7 [IU] via SUBCUTANEOUS
  Administered 2016-12-06: 5 [IU] via SUBCUTANEOUS
  Administered 2016-12-06: 7 [IU] via SUBCUTANEOUS
  Administered 2016-12-07 (×2): 1 [IU] via SUBCUTANEOUS
  Administered 2016-12-07: 2 [IU] via SUBCUTANEOUS
  Administered 2016-12-08: 1 [IU] via SUBCUTANEOUS
  Administered 2016-12-08: 2 [IU] via SUBCUTANEOUS
  Administered 2016-12-09: 1 [IU] via SUBCUTANEOUS
  Administered 2016-12-09: 2 [IU] via SUBCUTANEOUS
  Administered 2016-12-11: 1 [IU] via SUBCUTANEOUS

## 2016-12-05 MED ORDER — HEPARIN SODIUM (PORCINE) 5000 UNIT/ML IJ SOLN
5000.0000 [IU] | Freq: Three times a day (TID) | INTRAMUSCULAR | Status: DC
  Administered 2016-12-05 – 2016-12-09 (×12): 5000 [IU] via SUBCUTANEOUS
  Filled 2016-12-05 (×12): qty 1

## 2016-12-05 MED ORDER — INSULIN ASPART 100 UNIT/ML ~~LOC~~ SOLN
0.0000 [IU] | Freq: Every day | SUBCUTANEOUS | Status: DC
  Administered 2016-12-06: 3 [IU] via SUBCUTANEOUS

## 2016-12-05 MED ORDER — ONDANSETRON HCL 4 MG/2ML IJ SOLN
4.0000 mg | Freq: Four times a day (QID) | INTRAMUSCULAR | Status: DC | PRN
  Filled 2016-12-05: qty 2

## 2016-12-05 MED ORDER — ALBUTEROL SULFATE (2.5 MG/3ML) 0.083% IN NEBU
5.0000 mg | INHALATION_SOLUTION | Freq: Once | RESPIRATORY_TRACT | Status: AC
  Administered 2016-12-05: 5 mg via RESPIRATORY_TRACT
  Filled 2016-12-05: qty 6

## 2016-12-05 MED ORDER — METOPROLOL TARTRATE 50 MG PO TABS: 75.0000 mg | ORAL_TABLET | Freq: Two times a day (BID) | ORAL | Status: DC

## 2016-12-05 NOTE — Consult Note (Signed)
[facility] Renal Consultation Note  Requesting MD: Aggie Moats Indication for Consultation: AKI  HPI:  Cassandra Fisher is a 65 y.o. female with past medical history significant for COPD, diabetes mellitus, hypertension, CAD status post CABG in 2003,  gout, morbid obesity, CHF with an EF of 45-50 in 2011.  She was seen by her cardiologist in October and Vasotec was on her medication list. She also has CK D with creatinine appearing to be in the 1.4-1.6 range of late. Creatinine was 1.43 on 12/4. She presented to the emergency department today with worsening weakness over the last week, dyspnea on exertion, lower summary edema. Blood pressure is as low as 91/75 and 100/38. Presenting creatinine is 3.12 with potassium of 5.9 again on Vasotec. She's determined to be volume overloaded, BNP 991 and CXR  with pulmonary edema.  She has been started on Lasix. ACE inhibitor has been put on hold. She's also been placed on Solu-Medrol.  She has seen Dr. Marval Regal in the past in our office.   Creatinine, Ser  Date/Time Value Ref Range Status  11/24/2016 01:06 PM 3.12 (H) 0.44 - 1.00 mg/dL Final  11/17/2016 02:24 PM 1.43 (H) 0.40 - 1.20 mg/dL Final  07/17/2016 02:09 PM 1.41 (H) 0.44 - 1.00 mg/dL Final  06/03/2016 08:30 AM 1.63 (H) 0.40 - 1.20 mg/dL Final  10/11/2015 10:47 AM 1.54 (H) 0.40 - 1.20 mg/dL Final  03/19/2015 02:23 PM 1.61 (H) 0.40 - 1.20 mg/dL Final  02/07/2015 01:33 PM 1.37 (H) 0.40 - 1.20 mg/dL Final  05/25/2014 12:26 PM 1.4 (H) 0.4 - 1.2 mg/dL Final  04/03/2014 11:53 AM 1.6 (H) 0.4 - 1.2 mg/dL Final  03/27/2014 10:31 AM 1.11 (H) 0.50 - 1.10 mg/dL Final  03/26/2014 05:55 AM 1.43 (H) 0.50 - 1.10 mg/dL Final  03/25/2014 01:40 PM 1.93 (H) 0.50 - 1.10 mg/dL Final  09/06/2013 09:05 AM 1.5 (H) 0.4 - 1.2 mg/dL Final  04/21/2013 12:49 PM 1.3 (H) 0.4 - 1.2 mg/dL Final  02/21/2013 11:29 AM 1.2 0.4 - 1.2 mg/dL Final  02/17/2013 12:56 PM 1.3 (H) 0.4 - 1.2 mg/dL Final  02/10/2013 01:44 PM 1.4  (H) 0.4 - 1.2 mg/dL Final  09/01/2011 08:38 AM 1.4 (H) 0.4 - 1.2 mg/dL Final  01/27/2011 03:09 PM 1.3 (H) 0.4 - 1.2 mg/dL Final  10/28/2010 10:46 AM 1.2 0.4 - 1.2 mg/dL Final  10/25/2010 03:00 AM 1.05 0.4 - 1.2 mg/dL Final  10/24/2010 03:05 AM 1.17 0.4 - 1.2 mg/dL Final  10/23/2010 03:35 AM 1.14 0.4 - 1.2 mg/dL Final  10/22/2010 08:22 AM 1.5 (H) 0.4 - 1.2 mg/dL Final  12/05/2009 01:55 PM 1.05 0.4 - 1.2 mg/dL Final  01/31/2009 04:39 AM 1.14 0.4 - 1.2 mg/dL Final  01/30/2009 02:10 AM 1.22 (H) 0.4 - 1.2 mg/dL Final  01/29/2009 09:25 AM 1.15 0.4 - 1.2 mg/dL Final  09/28/2007 01:59 AM 0.98  Final  09/27/2007 07:19 AM 1.04  Final  09/27/2007 05:39 AM 1.1  Final     PMHx:   Past Medical History:  Diagnosis Date  . Anxiety   . Arthritis   . Atrial fibrillation ([place])   . CAD (coronary artery disease)   . CHF (congestive heart failure) ([place])   . COPD (chronic obstructive pulmonary disease) ([place])   . DM (diabetes mellitus) ([place])   . Gout   . HTN (hypertension)   . Hx of CABG   . Hyperlipidemia   . Kidney stones    x 3  . MI (myocardial infarction)   .  Obesity   . Vertigo     Past Surgical History:  Procedure Laterality Date  . CORONARY ARTERY BYPASS GRAFT  12/2001  . s/p dilatation and curettage    . TUBAL LIGATION      Family Hx:  Family History  Problem Relation Age of Onset  . Hip fracture Mother     died at 63 following hip fx  . Diabetes Mother     borderline  . Kidney disease Father     Bright's disease  . Diabetes Sister   . Lung cancer Maternal Uncle     x 2  . Breast cancer Maternal Aunt   . Crohn's disease Daughter   . Diabetes Maternal Grandmother   . Colon cancer Neg Hx     Social History:  reports that she quit smoking about 25 years ago. Her smoking use included Cigarettes. She has never used smokeless tobacco. She reports that she does not drink alcohol or use drugs.  Allergies:  Allergies  Allergen Reactions  . Colcrys [Colchicine] Other (See  Comments)    Dizzy and fill like pass out  . Metformin And Related Swelling  . Pravastatin Swelling  . Sulfa Antibiotics Swelling  . Zocor [Simvastatin] Hives and Swelling    Medications: Prior to Admission medications   Medication Sig Start Date End Date Taking? Authorizing Provider  acetaminophen (TYLENOL) 500 MG tablet Take 500 mg by mouth every 6 (six) hours as needed for mild pain or headache.   Yes Historical Provider, MD  aspirin EC 325 MG tablet Take 325 mg by mouth daily.    Yes Historical Provider, MD  cholecalciferol (VITAMIN D) 1000 UNITS tablet Take 1,000 Units by mouth daily.    Yes Historical Provider, MD  cyclobenzaprine (FLEXERIL) 5 MG tablet Take 1 tablet (5 mg total) by mouth 3 (three) times daily as needed. for muscle spams 01/18/16  Yes Lucille Passy, MD  enalapril (VASOTEC) 20 MG tablet TAKE ONE TABLET BY MOUTH ONCE DAILY 11/25/16  Yes Lelon Perla, MD  furosemide (LASIX) 40 MG tablet TAKE ONE TABLET BY MOUTH ONCE DAILY 08/21/16  Yes Lelon Perla, MD  glipiZIDE (GLUCOTROL) 5 MG tablet Take 1 tablet (5 mg total) by mouth 2 (two) times daily before a meal. OV WITH LABS REQUIRED FOR ANY ADDITIONAL REFILLS per dr Deborra Medina 11/24/16  Yes Lucille Passy, MD  insulin NPH (HUMULIN N,NOVOLIN N) 100 UNIT/ML injection Inject 82 Units into the skin 2 (two) times daily. 09/12/13  Yes Lucille Passy, MD  metoprolol (LOPRESSOR) 50 MG tablet TAKE ONE & ONE-HALF TABLETS BY MOUTH TWICE DAILY 08/21/16  Yes Lelon Perla, MD  Misc Natural Products (MIDNITE PM PO) Take 1 tablet by mouth at bedtime as needed (sleep). Bromelain 22m Melatonin 1.579mProprietary Blend 2266mLemon Balm Chamomile Lavender   Yes Historical Provider, MD  Multiple Vitamin (MULTIVITAMIN) capsule Take 1 capsule by mouth daily.     Yes Historical Provider, MD  nitroGLYCERIN (NITROSTAT) 0.4 MG SL tablet Place 1 tablet (0.4 mg total) under the tongue every 5 (five) minutes as needed for chest pain. 05/25/14  Yes ScoLiliane ShiA-C    I have reviewed the patient's current medications.  Labs:  Results for orders placed or performed during the hospital encounter of 11/30/2016 (from the past 48 hour(s))  Comprehensive metabolic panel     Status: Abnormal   Collection Time: 12/14/2016  1:06 PM  Result Value Ref Range   Sodium 130 (L)  135 - 145 mmol/L   Potassium 5.9 (H) 3.5 - 5.1 mmol/L   Chloride 97 (L) 101 - 111 mmol/L   CO2 22 22 - 32 mmol/L   Glucose, Bld 142 (H) 65 - 99 mg/dL   BUN 139 (H) 6 - 20 mg/dL   Creatinine, Ser 3.12 (H) 0.44 - 1.00 mg/dL   Calcium 8.3 (L) 8.9 - 10.3 mg/dL   Total Protein 6.8 6.5 - 8.1 g/dL   Albumin 2.5 (L) 3.5 - 5.0 g/dL   AST 22 15 - 41 U/L   ALT 19 14 - 54 U/L   Alkaline Phosphatase 177 (H) 38 - 126 U/L   Total Bilirubin 0.5 0.3 - 1.2 mg/dL   GFR calc non Af Amer 15 (L) >60 mL/min   GFR calc Af Amer 17 (L) >60 mL/min    Comment: (NOTE) The eGFR has been calculated using the CKD EPI equation. This calculation has not been validated in all clinical situations. eGFR's persistently <60 mL/min signify possible Chronic Kidney Disease.    Anion gap 11 5 - 15  CBC WITH DIFFERENTIAL     Status: Abnormal   Collection Time: 12/07/2016  1:06 PM  Result Value Ref Range   WBC 8.8 4.0 - 10.5 K/uL   RBC 4.02 3.87 - 5.11 MIL/uL   Hemoglobin 10.6 (L) 12.0 - 15.0 g/dL   HCT 34.5 (L) 36.0 - 46.0 %   MCV 85.8 78.0 - 100.0 fL   MCH 26.4 26.0 - 34.0 pg   MCHC 30.7 30.0 - 36.0 g/dL   RDW 16.6 (H) 11.5 - 15.5 %   Platelets 132 (L) 150 - 400 K/uL   Neutrophils Relative % 86 %   Neutro Abs 7.6 1.7 - 7.7 K/uL   Lymphocytes Relative 9 %   Lymphs Abs 0.8 0.7 - 4.0 K/uL   Monocytes Relative 4 %   Monocytes Absolute 0.4 0.1 - 1.0 K/uL   Eosinophils Relative 1 %   Eosinophils Absolute 0.1 0.0 - 0.7 K/uL   Basophils Relative 0 %   Basophils Absolute 0.0 0.0 - 0.1 K/uL  Troponin I (MHP)     Status: Abnormal   Collection Time: 11/26/2016  1:06 PM  Result Value Ref Range   Troponin I 0.03  (HH) <0.03 ng/mL    Comment: CRITICAL RESULT CALLED TO, READ BACK BY AND VERIFIED WITH: M.GAGE,RN 12/08/2016 1411 BY BSLADE   Brain natriuretic peptide     Status: Abnormal   Collection Time: 12/10/2016  1:06 PM  Result Value Ref Range   B Natriuretic Peptide 991.2 (H) 0.0 - 100.0 pg/mL  Glucose, capillary     Status: None   Collection Time: 11/20/2016  5:20 PM  Result Value Ref Range   Glucose-Capillary 94 65 - 99 mg/dL     ROS:  A comprehensive review of systems was negative except for: Constitutional: positive for fatigue Respiratory: positive for dyspnea on exertion and wheezing Cardiovascular: positive for lower extremity edema Musculoskeletal: positive for arthralgias and Due to gout  Physical Exam: Vitals:   11/21/2016 1630 12/14/2016 1720  BP: (!) 100/38 120/67  Pulse: 73 69  Resp: 20 (!) 22  Temp:  98.1 F (36.7 C)     General: Morbidly obese white female limited mobility HEENT: Pupils are equal round reactive to light, extraocular motions are intact, mucous membranes moist Neck: Positive for JVD Heart: Regular rate and rhythm Lungs: Decreased breath sounds at the bases Abdomen: Obese, soft, nontender Extremities: 2+ pitting edema bilaterally Skin:  Warm and dry Neuro: Alert. Nonfocal  Assessment/Plan: 65 year old female with multiple medical issues including coronary artery disease, CHF and diabetes presenting with acute kidney injury  1.Renal- acute kidney injury with creatinine on 12/4 of 1.43 now is 3.1.  I suspect ATN secondary to low blood pressure on Vasotec in the setting of diabetic nephropathy. I agree with holding the Vasotec at this time. I will check urinalysis and renal ultrasound to rule out other etiologies.  No indications for dialysis at this time and I am hopeful that this will turn around. Just not sure what brought on the low blood pressure. She's had no change in medicines- I hope she has not had a cardiac event 2. Hypertension/volume  - patient is  volume overloaded with a lowish blood pressure.  ACE inhibitor has been held. I will decrease dose of beta blocker and put parameters on it. I agree with IV Lasix  3. Hyperkalemia - likely secondary to ACE inhibitor. Hopefully will resolve  4. Anemia  - not a significant issue at this time  Thank you for this consult. We will continue to follow with you  Kryslyn Helbig A 11/23/2016, 5:36 PM

## 2016-12-05 NOTE — H&P (Signed)
History and Physical    Cassandra Fisher RUE:[id number] DOB: [birth year] DOA: 11/30/2016  PCP: [name], MD Patient coming from: home  Chief Complaint: weakness  HPI: Cassandra Fisher is a 65 y.o. female with medical history significant COPD, CHF, chronic kidney disease, diabetes, hypertension, gout, obesity presents emergency Department chief complaint generalized weakness. Initial evaluation reveals acute on chronic kidney disease in the setting of acute on chronic heart failure with mild COPD exacerbation possibly elevated troponin and hyperkalemia  Information is obtained from the patient. She states that over the last 5-7 day she has experienced gradual increasing generalized weakness. Husband states during this timeframe she has become more short of breath with exertion and increased cough and sputum production. Associated symptoms include sitting lower extremity edema and a little bit of serous with decreased oral intake and decreased urine output. She reports some frequency but denies dysuria hematuria. She denies headache dizziness syncope or near-syncope. She denies chest pain palpitations. She denies abdominal pain nausea vomiting diarrhea.    ED Course: In the emergency department she is afebrile hemodynamically stable and not hypoxic. Provided with nebulizers  Review of Systems: As per HPI otherwise 10 point review of systems negative.   Ambulatory Status: He ambulates independently but lately with worsening edema and gout left knee has required a cane  Past Medical History:  Diagnosis Date  . Anxiety   . Arthritis   . Atrial fibrillation ([facility])   . CAD (coronary artery disease)   . CHF (congestive heart failure) ([facility])   . COPD (chronic obstructive pulmonary disease) ([facility])   . DM (diabetes mellitus) ([facility])   . Gout   . HTN (hypertension)   . Hx of CABG   . Hyperlipidemia   . Kidney stones    x 3  . MI (myocardial infarction)   . Obesity   . Vertigo     Past Surgical  History:  Procedure Laterality Date  . CORONARY ARTERY BYPASS GRAFT  12/2001  . s/p dilatation and curettage    . TUBAL LIGATION      Social History   Social History  . Marital status: Married    Spouse name: N/A  . Number of children: 4  . Years of education: N/A   Occupational History  . Not on file.   Social History Main Topics  . Smoking status: Former Smoker    Types: Cigarettes    Quit date: 09/01/1991  . Smokeless tobacco: Never Used     Comment: has a 30 pack year history, quit in late 1990s  . Alcohol use No  . Drug use: No  . Sexual activity: No   Other Topics Concern  . Not on file   Social History Narrative  . No narrative on file    Allergies  Allergen Reactions  . Colcrys [Colchicine] Other (See Comments)    Dizzy and fill like pass out  . Metformin And Related Swelling  . Pravastatin Swelling  . Sulfa Antibiotics Swelling  . Zocor [Simvastatin] Hives and Swelling    Family History  Problem Relation Age of Onset  . Hip fracture Mother     died at 29 following hip fx  . Diabetes Mother     borderline  . Kidney disease Father     Bright's disease  . Diabetes Sister   . Lung cancer Maternal Uncle     x 2  . Breast cancer Maternal Aunt   . Crohn's disease Daughter   . Diabetes Maternal  Grandmother   . Colon cancer Neg Hx     Prior to Admission medications   Medication Sig Start Date End Date Taking? Authorizing Provider  acetaminophen (TYLENOL) 500 MG tablet Take 500 mg by mouth every 6 (six) hours as needed for mild pain or headache.   Yes Historical Provider, MD  aspirin EC 325 MG tablet Take 325 mg by mouth daily.    Yes Historical Provider, MD  cholecalciferol (VITAMIN D) 1000 UNITS tablet Take 1,000 Units by mouth daily.    Yes Historical Provider, MD  cyclobenzaprine (FLEXERIL) 5 MG tablet Take 1 tablet (5 mg total) by mouth 3 (three) times daily as needed. for muscle spams 01/18/16  Yes Dianne Dun, MD  enalapril (VASOTEC) 20 MG tablet  TAKE ONE TABLET BY MOUTH ONCE DAILY 11/25/16  Yes Lewayne Bunting, MD  furosemide (LASIX) 40 MG tablet TAKE ONE TABLET BY MOUTH ONCE DAILY 08/21/16  Yes Lewayne Bunting, MD  glipiZIDE (GLUCOTROL) 5 MG tablet Take 1 tablet (5 mg total) by mouth 2 (two) times daily before a meal. OV WITH LABS REQUIRED FOR ANY ADDITIONAL REFILLS per dr Dayton Martes 11/24/16  Yes Dianne Dun, MD  insulin NPH (HUMULIN N,NOVOLIN N) 100 UNIT/ML injection Inject 82 Units into the skin 2 (two) times daily. 09/12/13  Yes Dianne Dun, MD  metoprolol (LOPRESSOR) 50 MG tablet TAKE ONE & ONE-HALF TABLETS BY MOUTH TWICE DAILY 08/21/16  Yes Lewayne Bunting, MD  Misc Natural Products (MIDNITE PM PO) Take 1 tablet by mouth at bedtime as needed (sleep). Bromelain 20mg  Melatonin 1.5mg  Proprietary Blend Lemon Balm Chamomile Lavender   Yes Historical Provider, MD  Multiple Vitamin (MULTIVITAMIN) capsule Take 1 capsule by mouth daily.     Yes Historical Provider, MD  nitroGLYCERIN (NITROSTAT) 0.4 MG SL tablet Place 1 tablet (0.4 mg total) under the tongue every 5 (five) minutes as needed for chest pain. 05/25/14  Yes Beatrice Lecher, PA-C    Physical Exam: Vitals:   2016/12/10 1308 12/10/2016 1312 12-10-2016 1545 12-10-2016 1600  BP:  120/58 (!) 97/47 91/75  Pulse: 65  72 72  Resp: 14  20 17   Temp: 97.8 F (36.6 C)     TempSrc: Oral     SpO2: 95%  93% 93%  Weight:      Height:         General:  Appear B6 quite puffy in the face no acute distress Eyes:  PERRL, EOMI, normal lids, iris ENT:  grossly normal hearing, lips & tongue, his membranes of her mouth are pink but dry Neck:  no LAD, masses or thyromegaly Cardiovascular:  RRR, no m/r/g. Plus lower extremity edema Respiratory: Mild increased work of breathing breath sounds quite diminished throughout. No wheeze no rhonchi Abdomen:  soft, obese nontender nondistended no guarding or rebounding Skin:  no rash or induration seen on limited exam Musculoskeletal:  Right elbow with mild  erythema mild every 8 very tender to palpation left knee with mild edema mild erythema very tender to palpation Psychiatric:  grossly normal mood and affect, speech fluent and appropriate, AOx3 Neurologic:  CN 2-12 grossly intact, moves all extremities in coordinated fashion, sensation intact  Labs on Admission: I have personally reviewed following labs and imaging studies  CBC:  Recent Labs Lab 2016-12-10 1306  WBC 8.8  NEUTROABS 7.6  HGB 10.6*  HCT 34.5*  MCV 85.8  PLT 132*   Basic Metabolic Panel:  Recent Labs Lab 12-10-2016 1306  NA  130*  K 5.9*  CL 97*  CO2 22  GLUCOSE 142*  BUN 139*  CREATININE 3.12*  CALCIUM 8.3*   GFR: Estimated Creatinine Clearance: 23.4 mL/min (by C-G formula based on SCr of 3.12 mg/dL (H)). Liver Function Tests:  Recent Labs Lab January 01, 2017 1306  AST 22  ALT 19  ALKPHOS 177*  BILITOT 0.5  PROT 6.8  ALBUMIN 2.5*   No results for input(s): LIPASE, AMYLASE in the last 168 hours. No results for input(s): AMMONIA in the last 168 hours. Coagulation Profile: No results for input(s): INR, PROTIME in the last 168 hours. Cardiac Enzymes:  Recent Labs Lab 01/01/17 1306  TROPONINI 0.03*   BNP (last 3 results) No results for input(s): PROBNP in the last 8760 hours. HbA1C: No results for input(s): HGBA1C in the last 72 hours. CBG: No results for input(s): GLUCAP in the last 168 hours. Lipid Profile: No results for input(s): CHOL, HDL, LDLCALC, TRIG, CHOLHDL, LDLDIRECT in the last 72 hours. Thyroid Function Tests: No results for input(s): TSH, T4TOTAL, FREET4, T3FREE, THYROIDAB in the last 72 hours. Anemia Panel: No results for input(s): VITAMINB12, FOLATE, FERRITIN, TIBC, IRON, RETICCTPCT in the last 72 hours. Urine analysis:    Component Value Date/Time   COLORURINE YELLOW 03/25/2014 1436   APPEARANCEUR CLOUDY (A) 03/25/2014 1436   LABSPEC 1.017 03/25/2014 1436   PHURINE 5.0 03/25/2014 1436   GLUCOSEU 100 (A) 03/25/2014 1436   HGBUR  NEGATIVE 03/25/2014 1436   BILIRUBINUR NEGATIVE 03/25/2014 1436   BILIRUBINUR unable to read 03/09/2014 1411   KETONESUR NEGATIVE 03/25/2014 1436   PROTEINUR NEGATIVE 03/25/2014 1436   UROBILINOGEN 0.2 03/25/2014 1436   NITRITE NEGATIVE 03/25/2014 1436   LEUKOCYTESUR SMALL (A) 03/25/2014 1436    Creatinine Clearance: Estimated Creatinine Clearance: 23.4 mL/min (by C-G formula based on SCr of 3.12 mg/dL (H)).  Sepsis Labs: @LABRCNTIP (procalcitonin:4,lacticidven:4) )No results found for this or any previous visit (from the past 240 hour(s)).   Radiological Exams on Admission: Dg Chest 2 View  Result Date: January 01, 2017 CLINICAL DATA:  Generalize weakness for several days. History of CHF, coronary artery disease and previous MI and, COPD, former smoker. EXAM: CHEST  2 VIEW COMPARISON:  None in PACs FINDINGS: There is a moderate-sized right pleural effusion. There is no pleural effusion on the left. The left lung is well-expanded. Both lungs exhibit increased interstitial density. The cardiac silhouette is enlarged. The pulmonary vascularity is engorged. The patient has undergone previous median sternotomy and CABG. There is calcification in the wall of the aortic arch. The observed bony thorax is unremarkable. IMPRESSION: COPD. CHF with pulmonary interstitial edema. Moderate-sized right pleural effusion. There may be right basilar atelectasis or pneumonia. Thoracic aortic atherosclerosis. Electronically Signed   By: David  [place] M.D.   On: 01/01/17 14:10    EKG: Independently reviewed. Sinus rhythm Short PR interval Nonspecific intraventricular conduction delay ST-t wave abnormality Abnormal ekg Assessment/Plan Principal Problem:   Acute renal failure superimposed on stage 3 chronic kidney disease (HCC) Active Problems:   Diabetes mellitus with renal complications (H[facility])   HLD (hyperlipidemia)   Anxiety state   Essential hypertension   Acute on chronic systolic heart failure ([facility])    COPD with exacerbation ([facility])   Hyperkalemia   Gout   Cardiomyopathy ([facility]CC)   CKD (chronic kidney disease) stage 3, GFR 30-59 ml/min   Elevated troponin   #1. Acute renal failure superimposed on stage III chronic kidney disease. Etiology uncertain. Maybe related to worsening HF and/or cardiorenal syndrome. Written 3.1  on admission baseline appears to be 1.4. Potassium 5.9 -Admit -Lasix 40 mg IV twice a day -Monitor urine output -If no improvement consider renal ultrasound -Nephrology consult  #2. Acute on chronic systolic heart failure. BNP 991 chest x-ray with pulmonary edema. Most recent echo 2011 estimated EF 45%. Patient was in A. fib at the time. Current EKG sinus rhythm. Appears overloaded. Medications include Lasix, metoprolol, enalapril -Daily weights -Lasix as noted above -Continue metoprolol -Hold ACE inhibitor  #3. Elevated troponin. Likely related to above. Patient denies chest pain. -Cycle troponin -Serial EKG -Home meds as noted above -Continue aspirin  #4. Diabetes. Serum glucose 142. Home medications include glipizide and NPH insulin -We will hold oral agents as appetite unreliable -Obtain a hemoglobin A1c -Scale insulin for optimal control  #5. Hyperkalemia. Mild. Potassium 5.8. Likely related to above -Lasix as noted above -Recheck in the morning  #6. Gout. Currently left knee and right elbow. -monitor -physical therapy -sed rate     DVT prophylaxis: heparin  Code Status: full  Family Communication: husband and daughter at bedside  Disposition Plan: home  Consults called: dr [facility] [place] [facility]  Admission status: obs    Cassandra Fisher,Ceasia Elwell M MD [facility]  If 7PM-7AM, please contact night-coverage [email or web address] Password [email or web address]  07/11/16, 4:27 PM

## 2016-12-05 NOTE — ED Triage Notes (Signed)
Pt coming in with c/o feeling weak and generalized malaise due to a cough for three weeks

## 2016-12-05 NOTE — ED Notes (Signed)
Attempted report X1

## 2016-12-05 NOTE — ED Provider Notes (Signed)
[facility]-EMERGENCY DEPT Provider Note   CSN: [id number] Arrival date & time: 12/14/2016  1300     History   Chief Complaint Chief Complaint  Patient presents with  . Cough    pt has had a cough for the past three weeks increasing cough for the past 3 days     HPI Cassandra Fisher is a 65 y.o. female.  HPI  Patient presents with concern of weakness in the generalized. She is here with a family member who states that patient has been progressively declining over the past 6 weeks patient acknowledges this, states that she has felt particularly poorly over the past 2 or 3 days, with cough, nausea, generalized weakness and lightheadedness, but no focal chest pain, no syncope, no fever. Recent medication changes, diet changes as above, with anorexia.   Past Medical History:  Diagnosis Date  . Anxiety   . Arthritis   . Atrial fibrillation (HCC)   . CAD (coronary artery disease)   . CHF (congestive heart failure) (HCC)   . COPD (chronic obstructive pulmonary disease) (HCC)   . DM (diabetes mellitus) (HCC)   . Gout   . HTN (hypertension)   . Hx of CABG   . Hyperlipidemia   . Kidney stones    x 3  . MI (myocardial infarction)   . Obesity   . Vertigo     Patient Active Problem List   Diagnosis Date Noted  . Type 2 diabetes mellitus with hyperglycemia (HCC) 06/27/2014  . Cardiomyopathy (HCC) 05/25/2014  . CKD (chronic kidney disease) stage 3, GFR 30-59 ml/min 05/25/2014  . Vitamin D deficiency 04/03/2014  . Gout 04/03/2014  . Hyperkalemia 03/25/2014  . Arthralgia 12/22/2013  . Elevated sed rate 09/09/2013  . Carotid stenosis 04/21/2013  . Hx of CABG   . Cerebrovascular disease 01/27/2011  . Diabetes mellitus with renal complications (HCC) 11/27/2010  . HLD (hyperlipidemia) 11/27/2010  . Obesity 11/27/2010  . ANXIETY 11/27/2010  . Essential hypertension 11/27/2010  . MI 11/27/2010  . Coronary atherosclerosis 11/27/2010  . ATRIAL FIBRILLATION 11/27/2010  . Chronic  systolic heart failure (HCC) 11/27/2010  . COPD 11/27/2010  . Backache 11/27/2010    Past Surgical History:  Procedure Laterality Date  . CORONARY ARTERY BYPASS GRAFT  12/2001  . s/p dilatation and curettage    . TUBAL LIGATION      OB History    No data available       Home Medications    Prior to Admission medications   Medication Sig Start Date End Date Taking? Authorizing Provider  acetaminophen (TYLENOL) 500 MG tablet Take 500 mg by mouth every 6 (six) hours as needed for mild pain or headache.    Historical Provider, MD  aspirin EC 325 MG tablet Take 325 mg by mouth daily.     Historical Provider, MD  cholecalciferol (VITAMIN D) 1000 UNITS tablet Take 1,000 Units by mouth daily.     Historical Provider, MD  cyclobenzaprine (FLEXERIL) 5 MG tablet Take 1 tablet (5 mg total) by mouth 3 (three) times daily as needed. for muscle spams 01/18/16   Dianne Dun, MD  enalapril (VASOTEC) 20 MG tablet TAKE ONE TABLET BY MOUTH ONCE DAILY 11/25/16   Lewayne Bunting, MD  furosemide (LASIX) 40 MG tablet TAKE ONE TABLET BY MOUTH ONCE DAILY 08/21/16   Lewayne Bunting, MD  glipiZIDE (GLUCOTROL) 5 MG tablet Take 1 tablet (5 mg total) by mouth 2 (two) times daily before a meal. OV  WITH LABS REQUIRED FOR ANY ADDITIONAL REFILLS per dr Dayton Martesaron 11/24/16   Dianne Dunalia M Aron, MD  insulin NPH (HUMULIN N,NOVOLIN N) 100 UNIT/ML injection Inject 82 Units into the skin 2 (two) times daily. 09/12/13   Dianne Dunalia M Aron, MD  metoprolol (LOPRESSOR) 50 MG tablet TAKE ONE & ONE-HALF TABLETS BY MOUTH TWICE DAILY 08/21/16   Lewayne BuntingBrian S Crenshaw, MD  Misc Natural Products (MIDNITE PM PO) Take by mouth. Sleep aid    Historical Provider, MD  Multiple Vitamin (MULTIVITAMIN) capsule Take 1 capsule by mouth daily.      Historical Provider, MD  nitroGLYCERIN (NITROSTAT) 0.4 MG SL tablet Place 1 tablet (0.4 mg total) under the tongue every 5 (five) minutes as needed for chest pain. 05/25/14   Beatrice LecherScott T Weaver, PA-C    Family History Family  History  Problem Relation Age of Onset  . Hip fracture Mother     died at 2584 following hip fx  . Diabetes Mother     borderline  . Kidney disease Father     Bright's disease  . Diabetes Sister   . Lung cancer Maternal Uncle     x 2  . Breast cancer Maternal Aunt   . Crohn's disease Daughter   . Diabetes Maternal Grandmother   . Colon cancer Neg Hx     Social History Social History  Substance Use Topics  . Smoking status: Former Smoker    Types: Cigarettes    Quit date: 09/01/1991  . Smokeless tobacco: Never Used     Comment: has a 30 pack year history, quit in late 1990s  . Alcohol use No     Allergies   Colcrys [colchicine]; Metformin and related; Pravastatin; Sulfa antibiotics; and Zocor [simvastatin]   Review of Systems Review of Systems  Constitutional: Positive for appetite change and fatigue.  HENT:       Per HPI, otherwise negative  Respiratory:       Per HPI, otherwise negative  Cardiovascular:       Per HPI, otherwise negative  Gastrointestinal: Positive for nausea. Negative for vomiting.  Endocrine:       Negative aside from HPI  Genitourinary:       Neg aside from HPI   Musculoskeletal:       Per HPI, otherwise negative  Skin: Negative.   Neurological: Positive for weakness and light-headedness. Negative for syncope.     Physical Exam Updated Vital Signs BP 120/58 (BP Location: Left Arm)   Pulse 65   Temp 97.8 F (36.6 C) (Oral)   Resp 14   Ht 5\' 7"  (1.702 m)   Wt 250 lb (113.4 kg)   SpO2 95%   BMI 39.16 kg/m   Physical Exam  Constitutional: She is oriented to person, place, and time. She appears well-developed and well-nourished. No distress.  Obese elderly appearing female awake, alert, speaking clearly, answering questions appropriately.  HENT:  Head: Normocephalic and atraumatic.  Eyes: Conjunctivae and EOM are normal.  Cardiovascular: Normal rate and regular rhythm.   Pulmonary/Chest: Effort normal. No stridor.  Diminished  breath sounds bilaterally  Abdominal: She exhibits no distension.  Musculoskeletal: She exhibits no edema.  Neurological: She is alert and oriented to person, place, and time. No cranial nerve deficit.  Skin: Skin is warm and dry.  Psychiatric: She has a normal mood and affect.  Nursing note and vitals reviewed.    ED Treatments / Results  Labs (all labs ordered are listed, but only abnormal results are displayed)  Labs Reviewed  COMPREHENSIVE METABOLIC PANEL - Abnormal; Notable for the following:       Result Value   Sodium 130 (*)    Potassium 5.9 (*)    Chloride 97 (*)    Glucose, Bld 142 (*)    BUN 139 (*)    Creatinine, Ser 3.12 (*)    Calcium 8.3 (*)    Albumin 2.5 (*)    Alkaline Phosphatase 177 (*)    GFR calc non Af Amer 15 (*)    GFR calc Af Amer 17 (*)    All other components within normal limits  CBC WITH DIFFERENTIAL/PLATELET - Abnormal; Notable for the following:    Hemoglobin 10.6 (*)    HCT 34.5 (*)    RDW 16.6 (*)    Platelets 132 (*)    All other components within normal limits  TROPONIN I - Abnormal; Notable for the following:    Troponin I 0.03 (*)    All other components within normal limits  BRAIN NATRIURETIC PEPTIDE - Abnormal; Notable for the following:    B Natriuretic Peptide 991.2 (*)    All other components within normal limits  CREATININE, SERUM  TROPONIN I  TROPONIN I  TROPONIN I  HEMOGLOBIN A1C    Initial labs notable for acute kidney injury, hyperkalemia, elevated BNP and elevated troponin  EKG  EKG Interpretation  Date/Time:  Friday December 05 2016 13:12:45 EST Ventricular Rate:  67 PR Interval:    QRS Duration: 160 QT Interval:  442 QTC Calculation: 467 R Axis:   180 Text Interpretation:  Sinus rhythm Short PR interval Nonspecific intraventricular conduction delay ST-t wave abnormality Abnormal ekg Confirmed by Gerhard Munch  MD [phone]) on 11/22/2016 1:14:58 PM Also confirmed by Gerhard Munch  MD ([id number]), editor Stout  CT, Jola Babinski [phone])  on 12/04/2016 1:51:57 PM       Radiology Dg Chest 2 View  Result Date: 12/11/2016 CLINICAL DATA:  Generalize weakness for several days. History of CHF, coronary artery disease and previous MI and, COPD, former smoker. EXAM: CHEST  2 VIEW COMPARISON:  None in PACs FINDINGS: There is a moderate-sized right pleural effusion. There is no pleural effusion on the left. The left lung is well-expanded. Both lungs exhibit increased interstitial density. The cardiac silhouette is enlarged. The pulmonary vascularity is engorged. The patient has undergone previous median sternotomy and CABG. There is calcification in the wall of the aortic arch. The observed bony thorax is unremarkable. IMPRESSION: COPD. CHF with pulmonary interstitial edema. Moderate-sized right pleural effusion. There may be right basilar atelectasis or pneumonia. Thoracic aortic atherosclerosis. Electronically Signed   By: David  [place] M.D.   On: 11/17/2016 14:10    Procedures Procedures (including critical care time)  Medications Ordered in ED Medications  metoprolol tartrate (LOPRESSOR) tablet 75 mg (not administered)  aspirin EC tablet 325 mg (not administered)  sodium chloride flush (NS) 0.9 % injection 3 mL (not administered)  sodium chloride flush (NS) 0.9 % injection 3 mL (not administered)  0.9 %  sodium chloride infusion (not administered)  acetaminophen (TYLENOL) tablet 650 mg (not administered)  ondansetron (ZOFRAN) injection 4 mg (not administered)  heparin injection 5,000 Units (not administered)  insulin aspart (novoLOG) injection 0-9 Units (not administered)  insulin aspart (novoLOG) injection 0-5 Units (not administered)  albuterol (PROVENTIL) (2.5 MG/3ML) 0.083% nebulizer solution 2.5 mg (not administered)  methylPREDNISolone sodium succinate (SOLU-MEDROL) 125 mg/2 mL injection 125 mg (not administered)  furosemide (LASIX) injection 40 mg (not administered)  albuterol (  PROVENTIL) (2.5  MG/3ML) 0.083% nebulizer solution 5 mg (5 mg Nebulization Given 12/10/2016 1444)     Initial Impression / Assessment and Plan / ED Course  I have reviewed the triage vital signs and the nursing notes.  Pertinent labs & imaging results that were available during my care of the patient were reviewed by me and considered in my medical decision making (see chart for details).  Clinical Course   On repeat exam the patient is in similar condition. Multiple family members are present, we discussed all findings including concern for acute kidney injury, acute exacerbation of congestive heart failure, hyperkalemia. Patient has received albuterol therapy, and initial fluid resuscitation provided for concern of failure to thrive has stopped. With no EKG changes, albuterol therapy is appropriate for her hyperkalemia. Given the patient's to findings of acute kidney injury and congestive heart failure exacerbation, patient requires admission for further evaluation and management.    Final Clinical Impressions(s) / ED Diagnoses  Acute kidney injury Acute congestive heart failure episode Elevated troponin Hyperkalemia   CRITICAL CARE Performed by: Gerhard Munch Total critical care time: 40 minutes Critical care time was exclusive of separately billable procedures and treating other patients. Critical care was necessary to treat or prevent imminent or life-threatening deterioration. Critical care was time spent personally by me on the following activities: development of treatment plan with patient and/or surrogate as well as nursing, discussions with consultants, evaluation of patient's response to treatment, examination of patient, obtaining history from patient or surrogate, ordering and performing treatments and interventions, ordering and review of laboratory studies, ordering and review of radiographic studies, pulse oximetry and re-evaluation of patient's condition.    Gerhard Munch,  MD 12/10/16 [phone]

## 2016-12-06 ENCOUNTER — Observation Stay (HOSPITAL_COMMUNITY): Payer: PPO

## 2016-12-06 DIAGNOSIS — M109 Gout, unspecified: Secondary | ICD-10-CM | POA: Diagnosis present

## 2016-12-06 DIAGNOSIS — E875 Hyperkalemia: Secondary | ICD-10-CM

## 2016-12-06 DIAGNOSIS — N189 Chronic kidney disease, unspecified: Secondary | ICD-10-CM | POA: Diagnosis not present

## 2016-12-06 DIAGNOSIS — R748 Abnormal levels of other serum enzymes: Secondary | ICD-10-CM | POA: Diagnosis not present

## 2016-12-06 DIAGNOSIS — Z515 Encounter for palliative care: Secondary | ICD-10-CM | POA: Diagnosis not present

## 2016-12-06 DIAGNOSIS — E611 Iron deficiency: Secondary | ICD-10-CM | POA: Diagnosis present

## 2016-12-06 DIAGNOSIS — N178 Other acute kidney failure: Secondary | ICD-10-CM | POA: Diagnosis not present

## 2016-12-06 DIAGNOSIS — R0600 Dyspnea, unspecified: Secondary | ICD-10-CM

## 2016-12-06 DIAGNOSIS — I248 Other forms of acute ischemic heart disease: Secondary | ICD-10-CM | POA: Diagnosis present

## 2016-12-06 DIAGNOSIS — Z951 Presence of aortocoronary bypass graft: Secondary | ICD-10-CM | POA: Diagnosis not present

## 2016-12-06 DIAGNOSIS — I429 Cardiomyopathy, unspecified: Secondary | ICD-10-CM | POA: Diagnosis present

## 2016-12-06 DIAGNOSIS — E1022 Type 1 diabetes mellitus with diabetic chronic kidney disease: Secondary | ICD-10-CM | POA: Diagnosis not present

## 2016-12-06 DIAGNOSIS — E1121 Type 2 diabetes mellitus with diabetic nephropathy: Secondary | ICD-10-CM | POA: Diagnosis present

## 2016-12-06 DIAGNOSIS — J441 Chronic obstructive pulmonary disease with (acute) exacerbation: Secondary | ICD-10-CM | POA: Diagnosis present

## 2016-12-06 DIAGNOSIS — J9621 Acute and chronic respiratory failure with hypoxia: Secondary | ICD-10-CM | POA: Diagnosis not present

## 2016-12-06 DIAGNOSIS — E785 Hyperlipidemia, unspecified: Secondary | ICD-10-CM | POA: Diagnosis present

## 2016-12-06 DIAGNOSIS — R5383 Other fatigue: Secondary | ICD-10-CM | POA: Diagnosis not present

## 2016-12-06 DIAGNOSIS — I5023 Acute on chronic systolic (congestive) heart failure: Secondary | ICD-10-CM

## 2016-12-06 DIAGNOSIS — I447 Left bundle-branch block, unspecified: Secondary | ICD-10-CM | POA: Diagnosis present

## 2016-12-06 DIAGNOSIS — E0821 Diabetes mellitus due to underlying condition with diabetic nephropathy: Secondary | ICD-10-CM | POA: Diagnosis not present

## 2016-12-06 DIAGNOSIS — D649 Anemia, unspecified: Secondary | ICD-10-CM | POA: Diagnosis present

## 2016-12-06 DIAGNOSIS — I130 Hypertensive heart and chronic kidney disease with heart failure and stage 1 through stage 4 chronic kidney disease, or unspecified chronic kidney disease: Secondary | ICD-10-CM | POA: Diagnosis present

## 2016-12-06 DIAGNOSIS — D696 Thrombocytopenia, unspecified: Secondary | ICD-10-CM | POA: Diagnosis present

## 2016-12-06 DIAGNOSIS — I255 Ischemic cardiomyopathy: Secondary | ICD-10-CM | POA: Diagnosis not present

## 2016-12-06 DIAGNOSIS — E1122 Type 2 diabetes mellitus with diabetic chronic kidney disease: Secondary | ICD-10-CM | POA: Diagnosis present

## 2016-12-06 DIAGNOSIS — I420 Dilated cardiomyopathy: Secondary | ICD-10-CM | POA: Diagnosis not present

## 2016-12-06 DIAGNOSIS — R05 Cough: Secondary | ICD-10-CM | POA: Diagnosis present

## 2016-12-06 DIAGNOSIS — I252 Old myocardial infarction: Secondary | ICD-10-CM | POA: Diagnosis not present

## 2016-12-06 DIAGNOSIS — E872 Acidosis: Secondary | ICD-10-CM | POA: Diagnosis present

## 2016-12-06 DIAGNOSIS — N170 Acute kidney failure with tubular necrosis: Secondary | ICD-10-CM | POA: Diagnosis present

## 2016-12-06 DIAGNOSIS — N179 Acute kidney failure, unspecified: Secondary | ICD-10-CM | POA: Diagnosis not present

## 2016-12-06 DIAGNOSIS — N183 Chronic kidney disease, stage 3 (moderate): Secondary | ICD-10-CM | POA: Diagnosis not present

## 2016-12-06 DIAGNOSIS — Z6841 Body Mass Index (BMI) 40.0 and over, adult: Secondary | ICD-10-CM | POA: Diagnosis not present

## 2016-12-06 DIAGNOSIS — I2510 Atherosclerotic heart disease of native coronary artery without angina pectoris: Secondary | ICD-10-CM | POA: Diagnosis present

## 2016-12-06 DIAGNOSIS — Z87891 Personal history of nicotine dependence: Secondary | ICD-10-CM | POA: Diagnosis not present

## 2016-12-06 DIAGNOSIS — J9622 Acute and chronic respiratory failure with hypercapnia: Secondary | ICD-10-CM | POA: Diagnosis not present

## 2016-12-06 DIAGNOSIS — Z7189 Other specified counseling: Secondary | ICD-10-CM | POA: Diagnosis not present

## 2016-12-06 LAB — URINALYSIS, ROUTINE W REFLEX MICROSCOPIC
BILIRUBIN URINE: NEGATIVE
Glucose, UA: NEGATIVE mg/dL
Hgb urine dipstick: NEGATIVE
KETONES UR: NEGATIVE mg/dL
Leukocytes, UA: NEGATIVE
NITRITE: NEGATIVE
Protein, ur: NEGATIVE mg/dL
Specific Gravity, Urine: 1.01 (ref 1.005–1.030)
pH: 5 (ref 5.0–8.0)

## 2016-12-06 LAB — ECHOCARDIOGRAM COMPLETE
AOPV: 0.46 m/s
AV Area mean vel: 1.5 cm2
AV VEL mean LVOT/AV: 0.43
AV area mean vel ind: 0.66 cm2/m2
AV peak Index: 0.69
AV vel: 1.64
AVA: 1.64 cm2
AVAREAVTI: 1.58 cm2
AVAREAVTIIND: 0.72 cm2/m2
AVG: 9 mmHg
AVPG: 14 mmHg
AVPKVEL: 190 cm/s
CHL CUP MV DEC (S): 208
DOP CAL AO MEAN VELOCITY: 143 cm/s
EERAT: 15.38
EWDT: 208 ms
FS: 27 % — AB (ref 28–44)
Height: 67 in
IVS/LV PW RATIO, ED: 0.89
LA ID, A-P, ES: 44 mm
LA diam index: 1.92 cm/m2
LA vol A4C: 76.8 ml
LAVOL: 84.1 mL
LAVOLIN: 36.7 mL/m2
LDCA: 3.46 cm2
LEFT ATRIUM END SYS DIAM: 44 mm
LV PW d: 8.85 mm — AB (ref 0.6–1.1)
LV e' LATERAL: 8.52 cm/s
LVEEAVG: 15.38
LVEEMED: 15.38
LVOT SV: 69 mL
LVOT VTI: 19.8 cm
LVOT peak VTI: 0.47 cm
LVOT peak vel: 86.8 cm/s
LVOTD: 21 mm
MV pk A vel: 74.4 m/s
MVPG: 7 mmHg
MVPKEVEL: 131 m/s
RV LATERAL S' VELOCITY: 8.18 cm/s
RV sys press: 33 mmHg
Reg peak vel: 276 cm/s
TAPSE: 10.9 mm
TDI e' lateral: 8.52
TDI e' medial: 5.12
TR max vel: 276 cm/s
VTI: 41.9 cm
Valve area index: 0.72
Weight: 4281.6 oz

## 2016-12-06 LAB — GLUCOSE, CAPILLARY
GLUCOSE-CAPILLARY: 281 mg/dL — AB (ref 65–99)
GLUCOSE-CAPILLARY: 217 mg/dL — AB (ref 65–99)
Glucose-Capillary: 320 mg/dL — ABNORMAL HIGH (ref 65–99)
Glucose-Capillary: 393 mg/dL — ABNORMAL HIGH (ref 65–99)
Glucose-Capillary: 347 mg/dL — ABNORMAL HIGH (ref 65–99)
Glucose-Capillary: 232 mg/dL — ABNORMAL HIGH (ref 65–99)

## 2016-12-06 LAB — POTASSIUM
POTASSIUM: 7.1 mmol/L — AB (ref 3.5–5.1)
Potassium: 5.4 mmol/L — ABNORMAL HIGH (ref 3.5–5.1)
Potassium: 4.6 mmol/L (ref 3.5–5.1)

## 2016-12-06 LAB — BASIC METABOLIC PANEL
Anion gap: 11 (ref 5–15)
BUN: 142 mg/dL — AB (ref 6–20)
CALCIUM: 8.4 mg/dL — AB (ref 8.9–10.3)
CO2: 24 mmol/L (ref 22–32)
Chloride: 94 mmol/L — ABNORMAL LOW (ref 101–111)
Creatinine, Ser: 3.06 mg/dL — ABNORMAL HIGH (ref 0.44–1.00)
GFR calc Af Amer: 17 mL/min — ABNORMAL LOW (ref >60)
GFR, EST NON AFRICAN AMERICAN: 15 mL/min — AB (ref >60)
Glucose, Bld: 273 mg/dL — ABNORMAL HIGH (ref 65–99)
Potassium: >7.5 mmol/L (ref 3.5–5.1)
Sodium: 129 mmol/L — ABNORMAL LOW (ref 135–145)

## 2016-12-06 LAB — CBC
HCT: 35.9 % — ABNORMAL LOW (ref 36.0–46.0)
Hemoglobin: 10.7 g/dL — ABNORMAL LOW (ref 12.0–15.0)
MCH: 26.5 pg (ref 26.0–34.0)
MCHC: 29.8 g/dL — AB (ref 30.0–36.0)
MCV: 88.9 fL (ref 78.0–100.0)
PLATELETS: 122 10*3/uL — AB (ref 150–400)
RBC: 4.04 MIL/uL (ref 3.87–5.11)
RDW: 17.1 % — AB (ref 11.5–15.5)
WBC: 7.8 10*3/uL (ref 4.0–10.5)

## 2016-12-06 LAB — TROPONIN I: TROPONIN I: 0.04 ng/mL — AB (ref <0.03)

## 2016-12-06 LAB — CREATININE, URINE, RANDOM: CREATININE, URINE: 92.44 mg/dL

## 2016-12-06 LAB — MRSA PCR SCREENING: MRSA by PCR: NEGATIVE

## 2016-12-06 LAB — HEMOGLOBIN A1C
HEMOGLOBIN A1C: 7.1 % — AB (ref 4.8–5.6)
MEAN PLASMA GLUCOSE: 157 mg/dL

## 2016-12-06 MED ORDER — SODIUM POLYSTYRENE SULFONATE 15 GM/60ML PO SUSP
30.0000 g | Freq: Once | ORAL | Status: AC
  Administered 2016-12-06: 30 g via ORAL
  Filled 2016-12-06: qty 120

## 2016-12-06 MED ORDER — FUROSEMIDE 10 MG/ML IJ SOLN
40.0000 mg | Freq: Once | INTRAMUSCULAR | Status: AC
  Administered 2016-12-06: 40 mg via INTRAVENOUS
  Filled 2016-12-06: qty 4

## 2016-12-06 MED ORDER — INSULIN ASPART 100 UNIT/ML IV SOLN
10.0000 [IU] | Freq: Once | INTRAVENOUS | Status: AC
  Administered 2016-12-06: 10 [IU] via INTRAVENOUS

## 2016-12-06 MED ORDER — ALBUTEROL SULFATE (2.5 MG/3ML) 0.083% IN NEBU
10.0000 mg | INHALATION_SOLUTION | RESPIRATORY_TRACT | Status: AC
  Administered 2016-12-06: 10 mg via RESPIRATORY_TRACT
  Filled 2016-12-06: qty 12

## 2016-12-06 MED ORDER — DEXTROSE 50 % IV SOLN
1.0000 | INTRAVENOUS | Status: AC
  Administered 2016-12-06: 50 mL via INTRAVENOUS
  Filled 2016-12-06: qty 50

## 2016-12-06 MED ORDER — DEXTROSE 50 % IV SOLN: 1.0000 | Freq: Once | INTRAVENOUS | Status: DC

## 2016-12-06 MED ORDER — SODIUM CHLORIDE 0.9 % IV SOLN
1.0000 g | Freq: Once | INTRAVENOUS | Status: DC
  Filled 2016-12-06: qty 10

## 2016-12-06 MED ORDER — INSULIN GLARGINE 100 UNIT/ML ~~LOC~~ SOLN: 24.0000 [IU] | Freq: Every day | SUBCUTANEOUS | Status: DC

## 2016-12-06 MED ORDER — STERILE WATER FOR INJECTION IV SOLN
INTRAVENOUS | Status: DC
  Administered 2016-12-06: 10:00:00 via INTRAVENOUS
  Filled 2016-12-06 (×2): qty 850

## 2016-12-06 MED ORDER — SODIUM CHLORIDE 0.9 % IV SOLN
1.0000 g | Freq: Once | INTRAVENOUS | Status: AC
  Administered 2016-12-06: 1 g via INTRAVENOUS
  Filled 2016-12-06 (×2): qty 10

## 2016-12-06 MED ORDER — INSULIN GLARGINE 100 UNIT/ML ~~LOC~~ SOLN
40.0000 [IU] | Freq: Every day | SUBCUTANEOUS | Status: DC
  Administered 2016-12-06 – 2016-12-11 (×6): 40 [IU] via SUBCUTANEOUS
  Filled 2016-12-06 (×7): qty 0.4

## 2016-12-06 MED ORDER — SODIUM POLYSTYRENE SULFONATE 15 GM/60ML PO SUSP
30.0000 g | ORAL | Status: AC
  Administered 2016-12-06: 30 g via ORAL
  Filled 2016-12-06: qty 120

## 2016-12-06 MED ORDER — SODIUM CHLORIDE 0.9 % IV SOLN
1.0000 g | INTRAVENOUS | Status: AC
  Administered 2016-12-06: 1 g via INTRAVENOUS

## 2016-12-06 MED ORDER — SODIUM POLYSTYRENE SULFONATE 15 GM/60ML PO SUSP: 30.0000 g | Freq: Once | ORAL | Status: DC

## 2016-12-06 MED ORDER — ORAL CARE MOUTH RINSE
15.0000 mL | Freq: Two times a day (BID) | OROMUCOSAL | Status: DC
  Administered 2016-12-07 – 2016-12-11 (×8): 15 mL via OROMUCOSAL

## 2016-12-06 MED ORDER — ALBUTEROL SULFATE (2.5 MG/3ML) 0.083% IN NEBU
INHALATION_SOLUTION | RESPIRATORY_TRACT | Status: AC
  Administered 2016-12-06: 2.5 mg
  Filled 2016-12-06: qty 12

## 2016-12-06 MED ORDER — INSULIN ASPART 100 UNIT/ML IV SOLN
5.0000 [IU] | Freq: Once | INTRAVENOUS | Status: AC
  Administered 2016-12-06: 5 [IU] via INTRAVENOUS

## 2016-12-06 MED ORDER — FUROSEMIDE 10 MG/ML IJ SOLN
60.0000 mg | Freq: Four times a day (QID) | INTRAMUSCULAR | Status: DC
  Administered 2016-12-06 – 2016-12-07 (×5): 60 mg via INTRAVENOUS
  Filled 2016-12-06 (×5): qty 6

## 2016-12-06 MED ORDER — ALBUTEROL SULFATE (2.5 MG/3ML) 0.083% IN NEBU: 10.0000 mg | INHALATION_SOLUTION | Freq: Once | RESPIRATORY_TRACT | Status: DC

## 2016-12-06 NOTE — Progress Notes (Signed)
Pt's k was 7.5 while I got report this am, calcium gluconate, insulin Novolog 10units, Kayexalate 9ml given, pt is asymptomatic and vitals stable, MD aware, CBG before insulin and after insulin is 320 and 390, 5units insulin IV given again, pt is getting sodium bicarbonate 125cc/hr right now, Foley catheter is just inserted, 12lead EKG done twice, pt is transferring to 96M for higher level of care, will continue to monitor the patient.

## 2016-12-06 NOTE — Progress Notes (Signed)
Report provided to the RN, Byrd Hesselbach, pt is transferring to 2M08 for further level of care.

## 2016-12-06 NOTE — Progress Notes (Signed)
Patient K is >7.5, EKG showing new LBBB, patient received hyperkalemia protocol including D50 with IV insulin, calcium gluconate, albuterol, and Kayexalate, on telemetry monitor, seen by renal who recommended initiation of hemodialysis, patient is adamant about not receiving any hemodialysis, she understands significant risk at her life, discussed multiple times, she still refusing dialysis, as well discussed with her husband who will try to convince her, meanwhile will transfer to ICU. Huey Bienenstock MD [phone]

## 2016-12-06 NOTE — Progress Notes (Signed)
Pt arrived on the unit.a/o x4, IV leaking and not flushing. Attempted IV x3 by different RNs (tolerated well), IV team consulted. New IV placed and working. Medications given as ordered. Pt complaining of being hungry. MD called, but NPO at this moment. Only meds for possability of intubation ahead. UO is ok. Pt is on 2L [place]. Family at the bedside, asking pt to "please do the dialysis", but patient states: "NO" Education was given about risks and complications. Emotional support was given to pt and family. Skin assessment: MASD: groin, folds of abdomen and under the breasts. Also, under right breast  very suspicious, (seems like vesicles) wound. MD notified. Both feet very cracked and dry with inflamed toes (with small wounds). Pt states "It is gout" [place] skars in lower legs (Pt states "I had Bipass" Bottom red, but blenchable. Foam applied to areas of need. Skin cleaned, powder applied to folds. Continue to monitor

## 2016-12-06 NOTE — Progress Notes (Signed)
  Echocardiogram 2D Echocardiogram has been performed.  Cassandra Fisher 12/06/2016, 3:19 PM

## 2016-12-06 NOTE — Progress Notes (Signed)
[facility] ROUNDING NOTE   Subjective:   Interval History:  K > 7.5 repeat pending  Patient refuses both dialysis and initially foley catheter . She is edematous and dyspneic and morbid obesity.  She called her husband and informed him that under no circumstances would she want dialysis Dr Vertell Novak was at bedside and he also was told that she would not want dialysis  Objective:  Vital signs in last 24 hours:  Temp:  [97.4 F (36.3 C)-98.2 F (36.8 C)] 97.4 F (36.3 C) (12/23 0902) Pulse Rate:  [59-73] 64 (12/23 0902) Resp:  [14-22] 20 (12/23 0528) BP: (91-120)/(38-86) 112/86 (12/23 0902) SpO2:  [92 %-100 %] 93 % (12/23 0902) Weight:  [113.4 kg (250 lb)-121.4 kg (267 lb 9.6 oz)] 121.4 kg (267 lb 9.6 oz) (12/23 0528)  Weight change:  Filed Weights   11/18/2016 1303 11/26/2016 1720 12/06/16 0528  Weight: 113.4 kg (250 lb) 121.1 kg (267 lb) 121.4 kg (267 lb 9.6 oz)    Intake/Output: I/O last 3 completed shifts: In: 360 [P.O.:360] Out: 0    Intake/Output this shift:  No intake/output data recorded.  CVS- RRR RS- CTA ABD- BS present soft non-distended EXT- no edema   Basic Metabolic Panel:  Recent Labs Lab 11/17/2016 1306 12/02/2016 1645 11/18/2016 2058 12/06/16 0303  NA 130*  --   --  129*  K 5.9*  --  6.0* >7.5*  CL 97*  --   --  94*  CO2 22  --   --  24  GLUCOSE 142*  --   --  273*  BUN 139*  --   --  142*  CREATININE 3.12* 2.96*  --  3.06*  CALCIUM 8.3*  --   --  8.4*  MG  --   --  2.9*  --   PHOS  --   --  7.0*  --     Liver Function Tests:  Recent Labs Lab 11/22/2016 1306  AST 22  ALT 19  ALKPHOS 177*  BILITOT 0.5  PROT 6.8  ALBUMIN 2.5*   No results for input(s): LIPASE, AMYLASE in the last 168 hours. No results for input(s): AMMONIA in the last 168 hours.  CBC:  Recent Labs Lab 12/14/2016 1306 12/06/16 0303  WBC 8.8 7.8  NEUTROABS 7.6  --   HGB 10.6* 10.7*  HCT 34.5* 35.9*  MCV 85.8 88.9  PLT 132* 122*    Cardiac  Enzymes:  Recent Labs Lab 11/23/2016 1306 12/07/2016 1645 11/24/2016 2058 12/06/16 0303  TROPONINI 0.03* 0.04* 0.04* 0.04*    BNP: Invalid input(s): POCBNP  CBG:  Recent Labs Lab 11/17/2016 1720 12/07/2016 2108 12/06/16 0608 12/06/16 0732 12/06/16 0800  GLUCAP 94 89 281* 320* 393*    Microbiology: Results for orders placed or performed in visit on 09/20/13  Fecal occult blood, imunochemical     Status: None   Collection Time: 09/20/13  2:56 PM  Result Value Ref Range Status   Fecal Occult Bld Positive Negative Final    Coagulation Studies: No results for input(s): LABPROT, INR in the last 72 hours.  Urinalysis:  Recent Labs  12/06/16 0142  COLORURINE YELLOW  LABSPEC 1.010  PHURINE 5.0  GLUCOSEU NEGATIVE  HGBUR NEGATIVE  BILIRUBINUR NEGATIVE  KETONESUR NEGATIVE  PROTEINUR NEGATIVE  NITRITE NEGATIVE  LEUKOCYTESUR NEGATIVE      Imaging: Dg Chest 2 View  Result Date: 12/06/2016 CLINICAL DATA:  Generalize weakness for several days. History of CHF, coronary artery disease and previous MI and,  COPD, former smoker. EXAM: CHEST  2 VIEW COMPARISON:  None in PACs FINDINGS: There is a moderate-sized right pleural effusion. There is no pleural effusion on the left. The left lung is well-expanded. Both lungs exhibit increased interstitial density. The cardiac silhouette is enlarged. The pulmonary vascularity is engorged. The patient has undergone previous median sternotomy and CABG. There is calcification in the wall of the aortic arch. The observed bony thorax is unremarkable. IMPRESSION: COPD. CHF with pulmonary interstitial edema. Moderate-sized right pleural effusion. There may be right basilar atelectasis or pneumonia. Thoracic aortic atherosclerosis. Electronically Signed   By: David  [place]azilandJordan M.D.   On: 11/16/2016 14:10   [place] [facility]  Result Date: 12/04/2016 CLINICAL DATA:  Acute on chronic kidney injury. EXAM: RENAL / URINARY TRACT ULTRASOUND COMPLETE COMPARISON:  None.  FINDINGS: Right Kidney: Length: 11.1 cm. Mildly increased echogenicity and moderate parenchymal thinning, consistent with medical renal disease. No hydronephrosis. No suspicious focal parenchymal lesion. Left Kidney: Length: 11.0 cm. Mildly increased echogenicity and moderate parenchymal thinning, consistent with medical renal disease. No hydronephrosis. No suspicious focal parenchymal lesion. Bladder: Appears normal for degree of bladder distention. IMPRESSION: Atrophic appearing kidneys with increased echogenicity consistent with medical renal disease. No hydronephrosis. Electronically Signed   By: Ellery Plunkaniel R Mitchell M.D.   On: 11/18/2016 23:17     Medications:   .  sodium bicarbonate 150 mEq in sterile water 1000 mL infusion     . albuterol  2.5 mg Nebulization TID  . aspirin EC  325 mg Oral Daily  . furosemide  40 mg Intravenous BID  . heparin  5,000 Units Subcutaneous Q8H  . insulin aspart  0-5 Units Subcutaneous QHS  . insulin aspart  0-9 Units Subcutaneous TID WC  . sodium chloride flush  3 mL Intravenous Q12H   sodium chloride, acetaminophen, albuterol, nitroGLYCERIN, ondansetron (ZOFRAN) IV, sodium chloride flush  Assessment/ Plan:  Assessment/Plan: 65 year old female with multiple medical issues including coronary artery disease, CHF and diabetes presenting with acute kidney injury  1.Renal- acute kidney injury with creatinine on 12/4 of 1.43 now is 3.1.  I suspect ATN secondary to low blood pressure on Vasotec in the setting of diabetic nephropathy. She will agree to a foley catheter to monitor output. 2. Hypertension/volume  - patient is volume overloaded with a lowish blood pressure.  ACE inhibitor has been held. I will decrease dose of beta blocker and put parameters on it. I agree with IV Lasix and foley placement  3. Hyperkalemia - We will attempt to manage medically and will give a little IV bicarbonate. She refuses dialysis as her aunt was on dialysis and died. She understands  that dialysis would be only temporary and still refuses   4. Anemia  - not a significant issue at this time    LOS: 0 Arleigh Odowd W @TODAY @9 :35 AM

## 2016-12-06 NOTE — Progress Notes (Addendum)
PROGRESS NOTE                                                                                                                                                                                                             Patient Demographics:    Cassandra Fisher, is a 65 y.o. female, DOB - 10/20/1951, ONG:[id number]  Admit date - 12/04/2016   Admitting Physician Haydee SalterPhillip M Hobbs, MD  Outpatient Primary MD for the patient is [name], MD  LOS - 0   Chief Complaint  Patient presents with  . Cough    pt has had a cough for the past three weeks increasing cough for the past 3 days        Brief Narrative   65 y.o. female with a Past Medical History negative for chronic kidney disease, diabetes, hypertension and CHF who presents with weakness, workup significant for acute renal failure, as well hyperkalemia.   Subjective:    Cassandra Fisher today has, No headache, No abdominal pain , Foley catheter inserted this a.m., she denies any chest pain, reports dyspnea   Assessment  & Plan :    Principal Problem:   Acute renal failure superimposed on stage 3 chronic kidney disease (HCC) Active Problems:   Diabetes mellitus with renal complications (HCC)   HLD (hyperlipidemia)   Anxiety state   Essential hypertension   Acute on chronic systolic heart failure (HCC)   COPD with exacerbation (HCC)   Hyperkalemia   Gout   Cardiomyopathy (HCC)   CKD (chronic kidney disease) stage 3, GFR 30-59 ml/min   Elevated troponin   Acute renal failure and CKD stage III - Baseline creatinine 1.5, with worsening creatinine in the setting of ATN secondary to low blood pressure on Vaseretic in the setting of diabetic nephropathy. - renal input greatly appreciated , continue to hold nephrotoxic medication, continue with diuresis for volume overload, continue to monitor ins and outs closely, will consider inserted. - Patient with significant hyperkalemia secondary to renal  failure, she is adamant about not receiving any hemodialysis, she has new left bundle branch block, she was clearly explained this is a life-threatening situation, stating this to her multiple family members, she still refusing hemodialysis.  Hyperkalemia - Secondary to renal failure, refusing hemodialysis, holding Vasotec, received Kayexalate, calcium gluconate, D50/IV insulin, albuterol, multiple times so far an  attempt to help with her hyperkalemia, optimal management is hemodialysis with patient is refusing.  Left bundle branch block - Continue to hyperkalemia, new onset, continue to monitor on telemetry, transferred to ICU.  Acute Hypoxic respiratory failure - Secondary to volume overload, possibly related to renal failure versus CHF - Continue with oxygen as needed, continue to diuresis aggressively - Will have one BiPAP when necessary.  COPD - No active wheezing, continue with nebs as needed  Acute on chronic systolic heart failure - Most recent echo in 2011 EF 45%, repeat 2-D echo pending - Continue with IV diuresis, continue to hold ACE inhibitor in the setting of renal failure, continue to hold beta blocker in the setting of soft blood pressure  Elevated troponin - Likely demand ischemia in the setting of renal failure, hypoxia and respiratory distress, upon a trend is non-ACS pattern 0.03> 0.04> 0.04 - Her left bundle branch block related to hyperkalemia  Diabetes mellitus - Patient on glipizide and insulin NPH 82 units twice a day , Continue with insulin sliding scale, maintenance control, will start on Lantus 40 units subcutaneous daily. - Follow on hemoglobin A1c  Code Status : Full code, patient at high risk for cardiopulmonary arrest with her continue to refusing hemodialysis, discussed with husband and daughter , transferred to ICU  Family Communication  : Cost with husband, daughter and granddaughter at bedside  Disposition Plan  : Pending further work  up.  Consults  :  Renal  Procedures  : None  DVT Prophylaxis  : Heparin - SCD  Lab Results  Component Value Date   PLT 122 (L) 12/06/2016    Antibiotics  :   Anti-infectives    None        Objective:   Vitals:   12/06/16 0755 12/06/16 0902 12/06/16 1115 12/06/16 1130  BP:  112/86 (!) 113/48 (!) 101/48  Pulse:  64  70  Resp:   20 18  Temp:  97.4 F (36.3 C) 97.3 F (36.3 C)   TempSrc:  Oral Oral   SpO2: 100% 93% 97% 94%  Weight:      Height:        Wt Readings from Last 3 Encounters:  12/06/16 121.4 kg (267 lb 9.6 oz)  07/17/16 109.8 kg (242 lb)  06/03/16 112.3 kg (247 lb 8 oz)     Intake/Output Summary (Last 24 hours) at 12/06/16 1154 Last data filed at 12/06/16 1100  Gross per 24 hour  Intake           482.92 ml  Output                0 ml  Net           482.92 ml     Physical Exam  Awake Alert, Oriented X 3, Normal affect Supple Neck,No JVD  Symmetrical Chest wall movement, Diminished air movement bilaterally at bases, no wheezing. RRR,No Gallops,Rubs or new Murmurs, No Parasternal Heave +ve B.Sounds, Abd Soft, No tenderness,  No rebound - guarding or rigidity. No Cyanosis,  No new Rash or bruise, +2 edema B/L    Data Review:    CBC  Recent Labs Lab 11/18/2016 1306 12/06/16 0303  WBC 8.8 7.8  HGB 10.6* 10.7*  HCT 34.5* 35.9*  PLT 132* 122*  MCV 85.8 88.9  MCH 26.4 26.5  MCHC 30.7 29.8*  RDW 16.6* 17.1*  LYMPHSABS 0.8  --   MONOABS 0.4  --   EOSABS 0.1  --  BASOSABS 0.0  --     Chemistries   Recent Labs Lab 12/11/16 1306 Dec 11, 2016 1645 12-11-2016 2058 12/06/16 0303 12/06/16 0714  NA 130*  --   --  129*  --   K 5.9*  --  6.0* >7.5* 7.1*  CL 97*  --   --  94*  --   CO2 22  --   --  24  --   GLUCOSE 142*  --   --  273*  --   BUN 139*  --   --  142*  --   CREATININE 3.12* 2.96*  --  3.06*  --   CALCIUM 8.3*  --   --  8.4*  --   MG  --   --  2.9*  --   --   AST 22  --   --   --   --   ALT 19  --   --   --   --   ALKPHOS  177*  --   --   --   --   BILITOT 0.5  --   --   --   --    ------------------------------------------------------------------------------------------------------------------ No results for input(s): CHOL, HDL, LDLCALC, TRIG, CHOLHDL, LDLDIRECT in the last 72 hours.  Lab Results  Component Value Date   HGBA1C 7.1 (H) December 11, 2016   ------------------------------------------------------------------------------------------------------------------ No results for input(s): TSH, T4TOTAL, T3FREE, THYROIDAB in the last 72 hours.  Invalid input(s): FREET3 ------------------------------------------------------------------------------------------------------------------ No results for input(s): VITAMINB12, FOLATE, FERRITIN, TIBC, IRON, RETICCTPCT in the last 72 hours.  Coagulation profile No results for input(s): INR, PROTIME in the last 168 hours.  No results for input(s): DDIMER in the last 72 hours.  Cardiac Enzymes  Recent Labs Lab 11-Dec-2016 1645 December 11, 2016 2058 12/06/16 0303  TROPONINI 0.04* 0.04* 0.04*   ------------------------------------------------------------------------------------------------------------------    Component Value Date/Time   BNP 991.2 (H) Dec 11, 2016 1306    Inpatient Medications  Scheduled Meds: . albuterol  2.5 mg Nebulization TID  . aspirin EC  325 mg Oral Daily  . calcium chloride  IV  1 g Intravenous Once  . furosemide  60 mg Intravenous Q6H  . heparin  5,000 Units Subcutaneous Q8H  . insulin aspart  0-5 Units Subcutaneous QHS  . insulin aspart  0-9 Units Subcutaneous TID WC  . sodium chloride flush  3 mL Intravenous Q12H   Continuous Infusions: PRN Meds:.sodium chloride, acetaminophen, albuterol, nitroGLYCERIN, ondansetron (ZOFRAN) IV, sodium chloride flush  Micro Results No results found for this or any previous visit (from the past 240 hour(s)).  Radiology Reports Dg Chest 2 View  Result Date: 12/11/16 CLINICAL DATA:  Generalize  weakness for several days. History of CHF, coronary artery disease and previous MI and, COPD, former smoker. EXAM: CHEST  2 VIEW COMPARISON:  None in PACs FINDINGS: There is a moderate-sized right pleural effusion. There is no pleural effusion on the left. The left lung is well-expanded. Both lungs exhibit increased interstitial density. The cardiac silhouette is enlarged. The pulmonary vascularity is engorged. The patient has undergone previous median sternotomy and CABG. There is calcification in the wall of the aortic arch. The observed bony thorax is unremarkable. IMPRESSION: COPD. CHF with pulmonary interstitial edema. Moderate-sized right pleural effusion. There may be right basilar atelectasis or pneumonia. Thoracic aortic atherosclerosis. Electronically Signed   By: David  [place] M.D.   On: 2016-12-11 14:10   US Renal  Result Date: December 11, 2016 CLINICAL DATA:  Acute on chronic kidney injury. EXAM: RENAL / URINARY TRACT ULTRASOUND  COMPLETE COMPARISON:  None. FINDINGS: Right Kidney: Length: 11.1 cm. Mildly increased echogenicity and moderate parenchymal thinning, consistent with medical renal disease. No hydronephrosis. No suspicious focal parenchymal lesion. Left Kidney: Length: 11.0 cm. Mildly increased echogenicity and moderate parenchymal thinning, consistent with medical renal disease. No hydronephrosis. No suspicious focal parenchymal lesion. Bladder: Appears normal for degree of bladder distention. IMPRESSION: Atrophic appearing kidneys with increased echogenicity consistent with medical renal disease. No hydronephrosis. Electronically Signed   By: Ellery Plunk M.D.   On: 11/23/2016 23:17      Dakotah Orrego M.D on 12/06/2016 at 11:54 AM  Between 7am to 7pm - Pager - [phone]  After 7pm go to [email or web address] - password [facility]  [facility] -  Office  [phone]

## 2016-12-07 ENCOUNTER — Encounter (HOSPITAL_COMMUNITY): Payer: Self-pay | Admitting: Cardiology

## 2016-12-07 DIAGNOSIS — E1022 Type 1 diabetes mellitus with diabetic chronic kidney disease: Secondary | ICD-10-CM

## 2016-12-07 DIAGNOSIS — N184 Chronic kidney disease, stage 4 (severe): Secondary | ICD-10-CM

## 2016-12-07 DIAGNOSIS — I5043 Acute on chronic combined systolic (congestive) and diastolic (congestive) heart failure: Secondary | ICD-10-CM

## 2016-12-07 DIAGNOSIS — I255 Ischemic cardiomyopathy: Secondary | ICD-10-CM

## 2016-12-07 DIAGNOSIS — N179 Acute kidney failure, unspecified: Secondary | ICD-10-CM

## 2016-12-07 DIAGNOSIS — N183 Chronic kidney disease, stage 3 (moderate): Secondary | ICD-10-CM

## 2016-12-07 DIAGNOSIS — R748 Abnormal levels of other serum enzymes: Secondary | ICD-10-CM

## 2016-12-07 LAB — RENAL FUNCTION PANEL
ALBUMIN: 2.4 g/dL — AB (ref 3.5–5.0)
Anion gap: 12 (ref 5–15)
BUN: 153 mg/dL — AB (ref 6–20)
CALCIUM: 8.6 mg/dL — AB (ref 8.9–10.3)
CO2: 24 mmol/L (ref 22–32)
CREATININE: 2.77 mg/dL — AB (ref 0.44–1.00)
Chloride: 98 mmol/L — ABNORMAL LOW (ref 101–111)
GFR calc Af Amer: 20 mL/min — ABNORMAL LOW (ref >60)
GFR calc non Af Amer: 17 mL/min — ABNORMAL LOW (ref >60)
GLUCOSE: 207 mg/dL — AB (ref 65–99)
PHOSPHORUS: 8.6 mg/dL — AB (ref 2.5–4.6)
POTASSIUM: 4.5 mmol/L (ref 3.5–5.1)
SODIUM: 134 mmol/L — AB (ref 135–145)

## 2016-12-07 LAB — GLUCOSE, CAPILLARY
GLUCOSE-CAPILLARY: 136 mg/dL — AB (ref 65–99)
GLUCOSE-CAPILLARY: 145 mg/dL — AB (ref 65–99)
Glucose-Capillary: 186 mg/dL — ABNORMAL HIGH (ref 65–99)
Glucose-Capillary: 148 mg/dL — ABNORMAL HIGH (ref 65–99)

## 2016-12-07 LAB — CBC
HCT: 31 % — ABNORMAL LOW (ref 36.0–46.0)
Hemoglobin: 9.3 g/dL — ABNORMAL LOW (ref 12.0–15.0)
MCH: 26 pg (ref 26.0–34.0)
MCHC: 30 g/dL (ref 30.0–36.0)
MCV: 86.6 fL (ref 78.0–100.0)
PLATELETS: 112 10*3/uL — AB (ref 150–400)
RBC: 3.58 MIL/uL — ABNORMAL LOW (ref 3.87–5.11)
RDW: 16.4 % — AB (ref 11.5–15.5)
WBC: 5.7 10*3/uL (ref 4.0–10.5)

## 2016-12-07 LAB — UREA NITROGEN, URINE: Urea Nitrogen, Ur: 478 mg/dL

## 2016-12-07 MED ORDER — DEXTROSE 5 % IV SOLN
120.0000 mg | Freq: Four times a day (QID) | INTRAVENOUS | Status: DC
Start: 2016-12-07 — End: 2016-12-10
  Administered 2016-12-07 – 2016-12-10 (×10): 120 mg via INTRAVENOUS
  Filled 2016-12-07 (×15): qty 12

## 2016-12-07 NOTE — Progress Notes (Signed)
[facility] ROUNDING NOTE   Subjective:   Interval History:  Fairly good urine output       2D ECHO EF 40%     She refused dialysis and had life threatening hyperkalemia   Fortunately K has improved   Will increase diuresis    Objective:  Vital signs in last 24 hours:  Temp:  [97.5 F (36.4 C)-98.8 F (37.1 C)] 98.8 F (37.1 C) (12/24 0815) Pulse Rate:  [68-79] 72 (12/24 1000) Resp:  [14-22] 19 (12/24 1000) BP: (67-139)/(34-74) 108/53 (12/24 1000) SpO2:  [87 %-95 %] 94 % (12/24 1000) Weight:  [124 kg (273 lb 5.9 oz)] 124 kg (273 lb 5.9 oz) (12/24 0500)  Weight change: 10.6 kg (23 lb 5.9 oz) Filed Weights   12/09/2016 1720 12/06/16 0528 12/07/16 0500  Weight: 121.1 kg (267 lb) 121.4 kg (267 lb 9.6 oz) 124 kg (273 lb 5.9 oz)    Intake/Output: I/O last 3 completed shifts: In: 762.9 [P.O.:480; I.V.:282.9] Out: 1375 [Urine:1375]   Intake/Output this shift:  Total I/O In: 30 [I.V.:30] Out: 50 [Urine:50]  CVS- RRR RS- CTA ABD- BS present soft non-distended EXT-  2+  edema   Basic Metabolic Panel:  Recent Labs Lab 12/11/2016 1306 11/22/2016 1645 11/16/2016 2058 12/06/16 0303 12/06/16 0714 12/06/16 1450 12/06/16 2015 12/07/16 0159  NA 130*  --   --  129*  --   --   --  134*  K 5.9*  --  6.0* >7.5* 7.1* 5.4* 4.6 4.5  CL 97*  --   --  94*  --   --   --  98*  CO2 22  --   --  24  --   --   --  24  GLUCOSE 142*  --   --  273*  --   --   --  207*  BUN 139*  --   --  142*  --   --   --  153*  CREATININE 3.12* 2.96*  --  3.06*  --   --   --  2.77*  CALCIUM 8.3*  --   --  8.4*  --   --   --  8.6*  MG  --   --  2.9*  --   --   --   --   --   PHOS  --   --  7.0*  --   --   --   --  8.6*    Liver Function Tests:  Recent Labs Lab 11/28/2016 1306 12/07/16 0159  AST 22  --   ALT 19  --   ALKPHOS 177*  --   BILITOT 0.5  --   PROT 6.8  --   ALBUMIN 2.5* 2.4*   No results for input(s): LIPASE, AMYLASE in the last 168 hours. No results for input(s):  AMMONIA in the last 168 hours.  CBC:  Recent Labs Lab 11/29/2016 1306 12/06/16 0303 12/07/16 0159  WBC 8.8 7.8 5.7  NEUTROABS 7.6  --   --   HGB 10.6* 10.7* 9.3*  HCT 34.5* 35.9* 31.0*  MCV 85.8 88.9 86.6  PLT 132* 122* 112*    Cardiac Enzymes:  Recent Labs Lab 12/04/2016 1306 11/21/2016 1645 12/01/2016 2058 12/06/16 0303  TROPONINI 0.03* 0.04* 0.04* 0.04*    BNP: Invalid input(s): POCBNP  CBG:  Recent Labs Lab 12/06/16 0800 12/06/16 1129 12/06/16 1546 12/06/16 2237 12/07/16 0821  GLUCAP 393* 347* 232* 217* 136*    Microbiology: Results for  orders placed or performed during the hospital encounter of 12/04/2016  MRSA PCR Screening     Status: None   Collection Time: 12/06/16 11:33 AM  Result Value Ref Range Status   MRSA by PCR NEGATIVE NEGATIVE Final    Comment:        The GeneXpert MRSA Assay (FDA approved for NASAL specimens only), is one component of a comprehensive MRSA colonization surveillance program. It is not intended to diagnose MRSA infection nor to guide or monitor treatment for MRSA infections.     Coagulation Studies: No results for input(s): LABPROT, INR in the last 72 hours.  Urinalysis:  Recent Labs  12/06/16 0142  COLORURINE YELLOW  LABSPEC 1.010  PHURINE 5.0  GLUCOSEU NEGATIVE  HGBUR NEGATIVE  BILIRUBINUR NEGATIVE  KETONESUR NEGATIVE  PROTEINUR NEGATIVE  NITRITE NEGATIVE  LEUKOCYTESUR NEGATIVE      Imaging: Dg Chest 2 View  Result Date: 11/26/2016 CLINICAL DATA:  Generalize weakness for several days. History of CHF, coronary artery disease and previous MI and, COPD, former smoker. EXAM: CHEST  2 VIEW COMPARISON:  None in PACs FINDINGS: There is a moderate-sized right pleural effusion. There is no pleural effusion on the left. The left lung is well-expanded. Both lungs exhibit increased interstitial density. The cardiac silhouette is enlarged. The pulmonary vascularity is engorged. The patient has undergone previous median  sternotomy and CABG. There is calcification in the wall of the aortic arch. The observed bony thorax is unremarkable. IMPRESSION: COPD. CHF with pulmonary interstitial edema. Moderate-sized right pleural effusion. There may be right basilar atelectasis or pneumonia. Thoracic aortic atherosclerosis. Electronically Signed   By: David  [place]azil[place] M.D.   On: 12/03/2016 14:10   [place] Renal  Result Date: 12/03/2016 CLINICAL DATA:  Acute on chronic kidney injury. EXAM: RENAL / URINARY TRACT ULTRASOUND COMPLETE COMPARISON:  None. FINDINGS: Right Kidney: Length: 11.1 cm. Mildly increased echogenicity and moderate parenchymal thinning, consistent with medical renal disease. No hydronephrosis. No suspicious focal parenchymal lesion. Left Kidney: Length: 11.0 cm. Mildly increased echogenicity and moderate parenchymal thinning, consistent with medical renal disease. No hydronephrosis. No suspicious focal parenchymal lesion. Bladder: Appears normal for degree of bladder distention. IMPRESSION: Atrophic appearing kidneys with increased echogenicity consistent with medical renal disease. No hydronephrosis. Electronically Signed   By: Ellery Plunkaniel R Mitchell M.D.   On: 12/07/2016 23:17     Medications:    . albuterol  2.5 mg Nebulization TID  . aspirin EC  325 mg Oral Daily  . furosemide  120 mg Intravenous Q6H  . heparin  5,000 Units Subcutaneous Q8H  . insulin aspart  0-5 Units Subcutaneous QHS  . insulin aspart  0-9 Units Subcutaneous TID WC  . insulin glargine  40 Units Subcutaneous Daily  . mouth rinse  15 mL Mouth Rinse BID  . sodium chloride flush  3 mL Intravenous Q12H   sodium chloride, acetaminophen, albuterol, nitroGLYCERIN, ondansetron (ZOFRAN) IV, sodium chloride flush  Assessment/ Plan:  Assessment/Plan:65 year old female with multiple medical issues including coronary artery disease, CHF and diabetes presenting with acute kidney injury  1.Renal-acute kidney injury with creatinine on 12/4 of 1.43 now is  3.1. I suspect ATN secondary to low blood pressure on Vasotec in the setting of diabetic nephropathy. She will agree to a foley catheter to monitor output. 2. Hypertension/volume - patient is volume overloaded with a lowish blood pressure. ACE inhibitor has been held. I will decrease dose of beta blocker and put parameters on it. I agree with increasing IV Lasix and foley placement  3. Hyperkalemia - Resolved medical therapy only  4. Anemia- not a significant issue at this time  LOS: 1 Philomena Buttermore W @TODAY @11 :28 AM

## 2016-12-07 NOTE — Progress Notes (Signed)
Assisted Pt to transfer from bed to chair.

## 2016-12-07 NOTE — Progress Notes (Signed)
UO is diminished, around 25 per every 2 hours, MD aware. Pt states: I lost appetite, and have no energy.   Pt refusing to turn. Education was provided about risk of immobility and possibility of pressure ulcers. With Pt's hx it will be hard to heal wounds. Pt verbalized understanding, but turns head away. Family at the bedside, encouraged to encourage the patient to turn at least every 2 hrs. Husband told same thing to the patient, but pt turns head away.

## 2016-12-07 NOTE — Progress Notes (Signed)
PROGRESS NOTE                                                                                                                                                                                                             Patient Demographics:    Cassandra Fisher, is a 65 y.o. female, DOB - 10/25/1951, QMV:[id number]  Admit date - 11/27/2016   Admitting Physician Haydee SalterPhillip M Hobbs, MD  Outpatient Primary MD for the patient is [name], MD  LOS - 1   Chief Complaint  Patient presents with  . Cough    pt has had a cough for the past three weeks increasing cough for the past 3 days        Brief Narrative   65 y.o. female with a Past Medical History negative for chronic kidney disease, diabetes, hypertension and CHF who presents with weakness, workup significant for acute renal failure, as well hyperkalemia, Significant volume overload, potassium> 7.5, with new LBBB, patient refused hemodialysis.   Subjective:    Cassandra Fisher today has, No headache, No abdominal pain , she denies any chest pain, Still reports dyspnea   Assessment  & Plan :    Principal Problem:   Acute renal failure superimposed on stage 3 chronic kidney disease (HCC) Active Problems:   Diabetes mellitus with renal complications (HCC)   HLD (hyperlipidemia)   Anxiety state   Essential hypertension   Acute on chronic systolic heart failure (HCC)   COPD with exacerbation (HCC)   Hyperkalemia   Gout   Cardiomyopathy (HCC)   CKD (chronic kidney disease) stage 3, GFR 30-59 ml/min   Elevated troponin   Acute renal failure and CKD stage III - Baseline creatinine 1.5, with worsening creatinine in the setting of ATN secondary to low blood pressure on vasotec in the setting of diabetic nephropathy. - renal input greatly appreciated , continue to hold nephrotoxic medication, continue with diuresis for volume overload, continue to monitor ins and outs closely, -1350 since admission, renal  function continues to improve despite large dose Lasix 60 mg IV every 6 hours, cardiorenal syndrome most likely contributing factor, will increase her diuresis to 120 mg IV every 6 hours as discussed with renal. - Patient with significant hyperkalemia secondary to renal failure, she is adamant about not receiving any hemodialysis, she has new left bundle branch block,  she was clearly explained this is a life-threatening situation, stating this to her multiple family members, she still refusing hemodialysis, hyperkalemia corrected with medical management.  Hyperkalemia - Secondary to renal failure, refusing hemodialysis, holding Vasotec, received Kayexalate, calcium gluconate, D50/IV insulin, albuterol, multiple times so far an attempt to help with her hyperkalemia, optimal management is hemodialysis with patient is refusing. - hypokalemia corrected with medical management, K is 4.5 today.  Left bundle branch block - Continue to hyperkalemia, new onset, continue to monitor on telemetry, repeat EKG today..  Acute Hypoxic respiratory failure - Secondary to volume overload, related to renal failure versus CHF - Continue with oxygen as needed, continue to diuresis aggressively - Will have one BiPAP when necessary.  COPD - No active wheezing, continue with nebs as needed  Acute on chronic systolic heart failure - Most recent echo in 2011 EF 45%, repeat 2-D echo with EF 40%, severe hypokinesis of the mid anterior septal myocardium. - Continue with IV diuresis, continue to hold ACE inhibitor in the setting of renal failure, continue to hold beta blocker in the setting of soft blood pressure - Cardiology consulted to assist with further management  Elevated troponin - Likely demand ischemia in the setting of renal failure, hypoxia and respiratory distress, upon a trend is non-ACS pattern 0.03> 0.04> 0.04 - Her left bundle branch block related to hyperkalemia  Diabetes mellitus - Patient on  glipizide and insulin NPH 82 units twice a day , Continue with insulin sliding scale, maintenance control, acceptable  on Lantus 40 units subcutaneous daily. - Follow on hemoglobin A1c  Code Status : Full code, patient at high risk for cardiopulmonary arrest with her continue to refusing hemodialysis.  Family Communication  : D/W patient , none at bedside today.  Disposition Plan  : Pending further work up.  Consults  :  Renal, cardiology  Procedures  : None  DVT Prophylaxis  : Heparin - SCD  Lab Results  Component Value Date   PLT 112 (L) 12/07/2016    Antibiotics  :   Anti-infectives    None        Objective:   Vitals:   12/07/16 0800 12/07/16 0815 12/07/16 0900 12/07/16 1000  BP: (!) 123/45  (!) 115/52 (!) 108/53  Pulse: 70  71 72  Resp: 20  20 19   Temp:  98.8 F (37.1 C)    TempSrc:  Oral    SpO2: 95%  95% 94%  Weight:      Height:        Wt Readings from Last 3 Encounters:  12/07/16 124 kg (273 lb 5.9 oz)  07/17/16 109.8 kg (242 lb)  06/03/16 112.3 kg (247 lb 8 oz)     Intake/Output Summary (Last 24 hours) at 12/07/16 1054 Last data filed at 12/07/16 1051  Gross per 24 hour  Intake           552.92 ml  Output             1425 ml  Net          -872.08 ml     Physical Exam  Awake Alert, Oriented X 3, Normal affect Supple Neck,No JVD  Symmetrical Chest wall movement, Diminished air movement bilaterally at bases, no wheezing. RRR,No Gallops,Rubs or new Murmurs, No Parasternal Heave +ve B.Sounds, Abd Soft, No tenderness,  No rebound - guarding or rigidity. No Cyanosis,  No new Rash or bruise, +2 edema B/L    Data Review:  CBC  Recent Labs Lab 2016-12-29 1306 12/06/16 0303 12/07/16 0159  WBC 8.8 7.8 5.7  HGB 10.6* 10.7* 9.3*  HCT 34.5* 35.9* 31.0*  PLT 132* 122* 112*  MCV 85.8 88.9 86.6  MCH 26.4 26.5 26.0  MCHC 30.7 29.8* 30.0  RDW 16.6* 17.1* 16.4*  LYMPHSABS 0.8  --   --   MONOABS 0.4  --   --   EOSABS 0.1  --   --   BASOSABS  0.0  --   --     Chemistries   Recent Labs Lab 12-29-16 1306 29-Dec-2016 1645 12/29/2016 2058 12/06/16 0303 12/06/16 0714 12/06/16 1450 12/06/16 2015 12/07/16 0159  NA 130*  --   --  129*  --   --   --  134*  K 5.9*  --  6.0* >7.5* 7.1* 5.4* 4.6 4.5  CL 97*  --   --  94*  --   --   --  98*  CO2 22  --   --  24  --   --   --  24  GLUCOSE 142*  --   --  273*  --   --   --  207*  BUN 139*  --   --  142*  --   --   --  153*  CREATININE 3.12* 2.96*  --  3.06*  --   --   --  2.77*  CALCIUM 8.3*  --   --  8.4*  --   --   --  8.6*  MG  --   --  2.9*  --   --   --   --   --   AST 22  --   --   --   --   --   --   --   ALT 19  --   --   --   --   --   --   --   ALKPHOS 177*  --   --   --   --   --   --   --   BILITOT 0.5  --   --   --   --   --   --   --    ------------------------------------------------------------------------------------------------------------------ No results for input(s): CHOL, HDL, LDLCALC, TRIG, CHOLHDL, LDLDIRECT in the last 72 hours.  Lab Results  Component Value Date   HGBA1C 7.1 (H) 2016-12-29   ------------------------------------------------------------------------------------------------------------------ No results for input(s): TSH, T4TOTAL, T3FREE, THYROIDAB in the last 72 hours.  Invalid input(s): FREET3 ------------------------------------------------------------------------------------------------------------------ No results for input(s): VITAMINB12, FOLATE, FERRITIN, TIBC, IRON, RETICCTPCT in the last 72 hours.  Coagulation profile No results for input(s): INR, PROTIME in the last 168 hours.  No results for input(s): DDIMER in the last 72 hours.  Cardiac Enzymes  Recent Labs Lab 12/29/2016 1645 December 29, 2016 2058 12/06/16 0303  TROPONINI 0.04* 0.04* 0.04*   ------------------------------------------------------------------------------------------------------------------    Component Value Date/Time   BNP 991.2 (H) 12/29/2016 1306     Inpatient Medications  Scheduled Meds: . albuterol  2.5 mg Nebulization TID  . aspirin EC  325 mg Oral Daily  . furosemide  60 mg Intravenous Q6H  . heparin  5,000 Units Subcutaneous Q8H  . insulin aspart  0-5 Units Subcutaneous QHS  . insulin aspart  0-9 Units Subcutaneous TID WC  . insulin glargine  40 Units Subcutaneous Daily  . mouth rinse  15 mL Mouth Rinse BID  . sodium chloride flush  3 mL Intravenous Q12H  Continuous Infusions: PRN Meds:.sodium chloride, acetaminophen, albuterol, nitroGLYCERIN, ondansetron (ZOFRAN) IV, sodium chloride flush  Micro Results Recent Results (from the past 240 hour(s))  MRSA PCR Screening     Status: None   Collection Time: 12/06/16 11:33 AM  Result Value Ref Range Status   MRSA by PCR NEGATIVE NEGATIVE Final    Comment:        The GeneXpert MRSA Assay (FDA approved for NASAL specimens only), is one component of a comprehensive MRSA colonization surveillance program. It is not intended to diagnose MRSA infection nor to guide or monitor treatment for MRSA infections.     Radiology Reports Dg Chest 2 View  Result Date: 12/08/16 CLINICAL DATA:  Generalize weakness for several days. History of CHF, coronary artery disease and previous MI and, COPD, former smoker. EXAM: CHEST  2 VIEW COMPARISON:  None in PACs FINDINGS: There is a moderate-sized right pleural effusion. There is no pleural effusion on the left. The left lung is well-expanded. Both lungs exhibit increased interstitial density. The cardiac silhouette is enlarged. The pulmonary vascularity is engorged. The patient has undergone previous median sternotomy and CABG. There is calcification in the wall of the aortic arch. The observed bony thorax is unremarkable. IMPRESSION: COPD. CHF with pulmonary interstitial edema. Moderate-sized right pleural effusion. There may be right basilar atelectasis or pneumonia. Thoracic aortic atherosclerosis. Electronically Signed   By: David   [place] M.D.   On: 12-08-2016 14:10   US Renal  Result Date: 12/08/2016 CLINICAL DATA:  Acute on chronic kidney injury. EXAM: RENAL / URINARY TRACT ULTRASOUND COMPLETE COMPARISON:  None. FINDINGS: Right Kidney: Length: 11.1 cm. Mildly increased echogenicity and moderate parenchymal thinning, consistent with medical renal disease. No hydronephrosis. No suspicious focal parenchymal lesion. Left Kidney: Length: 11.0 cm. Mildly increased echogenicity and moderate parenchymal thinning, consistent with medical renal disease. No hydronephrosis. No suspicious focal parenchymal lesion. Bladder: Appears normal for degree of bladder distention. IMPRESSION: Atrophic appearing kidneys with increased echogenicity consistent with medical renal disease. No hydronephrosis. Electronically Signed   By: Ellery Plunk M.D.   On: 08-Dec-2016 23:17      Amoreena Neubert M.D on 12/07/2016 at 10:54 AM  Between 7am to 7pm - Pager - [phone]  After 7pm go to [email or web address] - password [facility]  [facility] -  Office  [phone]

## 2016-12-07 NOTE — Consult Note (Signed)
Reason for Consult: HF ,  Referring Physician: Dr. Waldron Labs    PCP:  Arnette Norris, MD  Primary Cardiologist:Dr. Jonne Ply is an 66 y.o. female.    Chief Complaint: admitted 11/28/2016 for  HPI:  Asked to see 39 yof with hx CAD, CABG in 2003 and P a fib. LHC (10/2010): 6 bypass grafts patent,Severe disease in the proximal portion of the previously stented circumflex vessel that is unchanged from films in 2008 but is unprotected by a vein graft. Medical therapy was recommended. Echo (10/2010): EF 45% to 50%. Diffuse hypokinesis. Mitral valve: Mild regurgitation.  Carotid US (5/16): 40-59% bilateral.   Other hx of COPD, CKD, DM, HTN, HLD,   Now admitted with weakness.  She was found to be in acute renal failure, she had increased SOB, increased cough + sputum. + edema.  Also with mild COPD exacerbation. She denies any chest pain prior to admit or now.   Admit labs Cr 3.12, K+ 5.9, BNP 991, troponins 0.03;0.04; 0.04, 0.04- all flat.  K+ did climb to > 7.5 and she was treated.    Renal is following as well.  Her ACE was held.  CXR with pul. Edema.  She is diuresed and neg. 512 and wt today is up from 267 to 273.  Cr today 2.77, K+ 4.5  Echo with EF 40% and severe hypokinesis of midanteroseptal myocardium -new;  Mild to mod AS, moderate posterior MAC mild MR, LA mildly dilated, PA pk pressure 33 mmHg no pericardial effusion. Moderately reduced right ventricular contraction.  On echo in 2011 there was diffuse hypokinesis and EF 45-50%. Mild MR.   Currently lethargic, though once awake will answer questions.     Past Medical History:  Diagnosis Date  . Anxiety   . Arthritis   . Atrial fibrillation ([place])   . CAD (coronary artery disease)   . CHF (congestive heart failure) ([place])   . COPD (chronic obstructive pulmonary disease) ([place])   . DM (diabetes mellitus) ([place])   . Gout   . HTN (hypertension)   . Hx of CABG   . Hyperlipidemia   . Kidney stones    x 3  . MI  (myocardial infarction)   . Obesity   . Vertigo     Past Surgical History:  Procedure Laterality Date  . CORONARY ARTERY BYPASS GRAFT  12/2001  . s/p dilatation and curettage    . TUBAL LIGATION      Family History  Problem Relation Age of Onset  . Hip fracture Mother     died at 49 following hip fx  . Diabetes Mother     borderline  . Kidney disease Father     Bright's disease  . Diabetes Sister   . Lung cancer Maternal Uncle     x 2  . Breast cancer Maternal Aunt   . Crohn's disease Daughter   . Diabetes Maternal Grandmother   . Colon cancer Neg Hx    Social History:  reports that she quit smoking about 25 years ago. Her smoking use included Cigarettes. She has never used smokeless tobacco. She reports that she does not drink alcohol or use drugs.  Allergies:  Allergies  Allergen Reactions  . Colcrys [Colchicine] Other (See Comments)    Dizzy and fill like pass out  . Metformin And Related Swelling  . Pravastatin Swelling  . Sulfa Antibiotics Swelling  . Zocor [Simvastatin] Hives and Swelling    . albuterol  2.5 mg Nebulization TID  . aspirin EC  325 mg Oral Daily  . furosemide  120 mg Intravenous Q6H  . heparin  5,000 Units Subcutaneous Q8H  . insulin aspart  0-5 Units Subcutaneous QHS  . insulin aspart  0-9 Units Subcutaneous TID WC  . insulin glargine  40 Units Subcutaneous Daily  . mouth rinse  15 mL Mouth Rinse BID  . sodium chloride flush  3 mL Intravenous Q12H       OUTPATIENT MEDICATIONS: No current facility-administered medications on file prior to encounter.    Current Outpatient Prescriptions on File Prior to Encounter  Medication Sig Dispense Refill  . acetaminophen (TYLENOL) 500 MG tablet Take 500 mg by mouth every 6 (six) hours as needed for mild pain or headache.    Marland Kitchen aspirin EC 325 MG tablet Take 325 mg by mouth daily.     . cholecalciferol (VITAMIN D) 1000 UNITS tablet Take 1,000 Units by mouth daily.     . cyclobenzaprine (FLEXERIL) 5  MG tablet Take 1 tablet (5 mg total) by mouth 3 (three) times daily as needed. for muscle spams 20 tablet 0  . enalapril (VASOTEC) 20 MG tablet TAKE ONE TABLET BY MOUTH ONCE DAILY 90 tablet 3  . furosemide (LASIX) 40 MG tablet TAKE ONE TABLET BY MOUTH ONCE DAILY 30 tablet 4  . glipiZIDE (GLUCOTROL) 5 MG tablet Take 1 tablet (5 mg total) by mouth 2 (two) times daily before a meal. OV WITH LABS REQUIRED FOR ANY ADDITIONAL REFILLS per dr Deborra Medina 60 tablet 0  . insulin NPH (HUMULIN N,NOVOLIN N) 100 UNIT/ML injection Inject 82 Units into the skin 2 (two) times daily. 5 vial 3  . metoprolol (LOPRESSOR) 50 MG tablet TAKE ONE & ONE-HALF TABLETS BY MOUTH TWICE DAILY 90 tablet 4  . Misc Natural Products (MIDNITE PM PO) Take 1 tablet by mouth at bedtime as needed (sleep). Bromelain 70m Melatonin 1.532mProprietary Blend 2235mLemon Balm Chamomile Lavender    . Multiple Vitamin (MULTIVITAMIN) capsule Take 1 capsule by mouth daily.      . nitroGLYCERIN (NITROSTAT) 0.4 MG SL tablet Place 1 tablet (0.4 mg total) under the tongue every 5 (five) minutes as needed for chest pain. 25 tablet 3    CURRENT MEDICATIONS: Scheduled Meds: . albuterol  2.5 mg Nebulization TID  . aspirin EC  325 mg Oral Daily  . furosemide  120 mg Intravenous Q6H  . heparin  5,000 Units Subcutaneous Q8H  . insulin aspart  0-5 Units Subcutaneous QHS  . insulin aspart  0-9 Units Subcutaneous TID WC  . insulin glargine  40 Units Subcutaneous Daily  . mouth rinse  15 mL Mouth Rinse BID  . sodium chloride flush  3 mL Intravenous Q12H   Continuous Infusions: PRN Meds:.sodium chloride, acetaminophen, albuterol, nitroGLYCERIN, ondansetron (ZOFRAN) IV, sodium chloride flush   Results for orders placed or performed during the hospital encounter of 11/27/2016 (from the past 48 hour(s))  Comprehensive metabolic panel     Status: Abnormal   Collection Time: 11/21/2016  1:06 PM  Result Value Ref Range   Sodium 130 (L) 135 - 145 mmol/L    Potassium 5.9 (H) 3.5 - 5.1 mmol/L   Chloride 97 (L) 101 - 111 mmol/L   CO2 22 22 - 32 mmol/L   Glucose, Bld 142 (H) 65 - 99 mg/dL   BUN 139 (H) 6 - 20 mg/dL   Creatinine, Ser 3.12 (H) 0.44 - 1.00 mg/dL   Calcium 8.3 (L) 8.9 -  10.3 mg/dL   Total Protein 6.8 6.5 - 8.1 g/dL   Albumin 2.5 (L) 3.5 - 5.0 g/dL   AST 22 15 - 41 U/L   ALT 19 14 - 54 U/L   Alkaline Phosphatase 177 (H) 38 - 126 U/L   Total Bilirubin 0.5 0.3 - 1.2 mg/dL   GFR calc non Af Amer 15 (L) >60 mL/min   GFR calc Af Amer 17 (L) >60 mL/min    Comment: (NOTE) The eGFR has been calculated using the CKD EPI equation. This calculation has not been validated in all clinical situations. eGFR's persistently <60 mL/min signify possible Chronic Kidney Disease.    Anion gap 11 5 - 15  CBC WITH DIFFERENTIAL     Status: Abnormal   Collection Time: 12/04/2016  1:06 PM  Result Value Ref Range   WBC 8.8 4.0 - 10.5 K/uL   RBC 4.02 3.87 - 5.11 MIL/uL   Hemoglobin 10.6 (L) 12.0 - 15.0 g/dL   HCT 34.5 (L) 36.0 - 46.0 %   MCV 85.8 78.0 - 100.0 fL   MCH 26.4 26.0 - 34.0 pg   MCHC 30.7 30.0 - 36.0 g/dL   RDW 16.6 (H) 11.5 - 15.5 %   Platelets 132 (L) 150 - 400 K/uL   Neutrophils Relative % 86 %   Neutro Abs 7.6 1.7 - 7.7 K/uL   Lymphocytes Relative 9 %   Lymphs Abs 0.8 0.7 - 4.0 K/uL   Monocytes Relative 4 %   Monocytes Absolute 0.4 0.1 - 1.0 K/uL   Eosinophils Relative 1 %   Eosinophils Absolute 0.1 0.0 - 0.7 K/uL   Basophils Relative 0 %   Basophils Absolute 0.0 0.0 - 0.1 K/uL  Troponin I (MHP)     Status: Abnormal   Collection Time: 12/04/2016  1:06 PM  Result Value Ref Range   Troponin I 0.03 (HH) <0.03 ng/mL    Comment: CRITICAL RESULT CALLED TO, READ BACK BY AND VERIFIED WITH: M.GAGE,RN 11/28/2016 1411 BY BSLADE   Brain natriuretic peptide     Status: Abnormal   Collection Time: 12/11/2016  1:06 PM  Result Value Ref Range   B Natriuretic Peptide 991.2 (H) 0.0 - 100.0 pg/mL  Hemoglobin A1c     Status: Abnormal   Collection  Time: 11/20/2016  1:06 PM  Result Value Ref Range   Hgb A1c MFr Bld 7.1 (H) 4.8 - 5.6 %    Comment: (NOTE)         Pre-diabetes: 5.7 - 6.4         Diabetes: >6.4         Glycemic control for adults with diabetes: <7.0    Mean Plasma Glucose 157 mg/dL    Comment: (NOTE) Performed At: [facility] [address] [place], [place] [id number] Lindon Romp MD [id number]   Creatinine, serum     Status: Abnormal   Collection Time: 11/19/2016  4:45 PM  Result Value Ref Range   Creatinine, Ser 2.96 (H) 0.44 - 1.00 mg/dL   GFR calc non Af Amer 16 (L) >60 mL/min   GFR calc Af Amer 18 (L) >60 mL/min    Comment: (NOTE) The eGFR has been calculated using the CKD EPI equation. This calculation has not been validated in all clinical situations. eGFR's persistently <60 mL/min signify possible Chronic Kidney Disease.   Troponin I     Status: Abnormal   Collection Time: 11/18/2016  4:45 PM  Result Value Ref Range   Troponin I  0.04 (HH) <0.03 ng/mL    Comment: CRITICAL VALUE NOTED.  VALUE IS CONSISTENT WITH PREVIOUSLY REPORTED AND CALLED VALUE.  Glucose, capillary     Status: None   Collection Time: 11/15/2016  5:20 PM  Result Value Ref Range   Glucose-Capillary 94 65 - 99 mg/dL  Troponin I     Status: Abnormal   Collection Time: 12/06/2016  8:58 PM  Result Value Ref Range   Troponin I 0.04 (HH) <0.03 ng/mL    Comment: CRITICAL VALUE NOTED.  VALUE IS CONSISTENT WITH PREVIOUSLY REPORTED AND CALLED VALUE.  Magnesium     Status: Abnormal   Collection Time: 11/29/2016  8:58 PM  Result Value Ref Range   Magnesium 2.9 (H) 1.7 - 2.4 mg/dL  Phosphorus     Status: Abnormal   Collection Time: 11/16/2016  8:58 PM  Result Value Ref Range   Phosphorus 7.0 (H) 2.5 - 4.6 mg/dL  Potassium     Status: Abnormal   Collection Time: 11/21/2016  8:58 PM  Result Value Ref Range   Potassium 6.0 (H) 3.5 - 5.1 mmol/L  Glucose, capillary     Status: None   Collection Time: 11/17/2016  9:08 PM  Result Value Ref  Range   Glucose-Capillary 89 65 - 99 mg/dL   Comment 1 Notify RN    Comment 2 Document in Chart   Urinalysis, Routine w reflex microscopic     Status: None   Collection Time: 12/06/16  1:42 AM  Result Value Ref Range   Color, Urine YELLOW YELLOW   APPearance CLEAR CLEAR   Specific Gravity, Urine 1.010 1.005 - 1.030   pH 5.0 5.0 - 8.0   Glucose, UA NEGATIVE NEGATIVE mg/dL   Hgb urine dipstick NEGATIVE NEGATIVE   Bilirubin Urine NEGATIVE NEGATIVE   Ketones, ur NEGATIVE NEGATIVE mg/dL   Protein, ur NEGATIVE NEGATIVE mg/dL   Nitrite NEGATIVE NEGATIVE   Leukocytes, UA NEGATIVE NEGATIVE  Creatinine, urine, random     Status: None   Collection Time: 12/06/16  1:42 AM  Result Value Ref Range   Creatinine, Urine 92.44 mg/dL  Urea nitrogen, urine     Status: None   Collection Time: 12/06/16  1:42 AM  Result Value Ref Range   Urea Nitrogen, Ur 478 Not Estab. mg/dL    Comment: (NOTE) Performed At: [facility] [address] [place], [place] [id number] Lindon Romp MD [id number]   Troponin I     Status: Abnormal   Collection Time: 12/06/16  3:03 AM  Result Value Ref Range   Troponin I 0.04 (HH) <0.03 ng/mL    Comment: CRITICAL VALUE NOTED.  VALUE IS CONSISTENT WITH PREVIOUSLY REPORTED AND CALLED VALUE.  Basic metabolic panel     Status: Abnormal   Collection Time: 12/06/16  3:03 AM  Result Value Ref Range   Sodium 129 (L) 135 - 145 mmol/L   Potassium >7.5 (HH) 3.5 - 5.1 mmol/L    Comment: DELTA CHECK NOTED NO VISIBLE HEMOLYSIS CRITICAL RESULT CALLED TO, READ BACK BY AND VERIFIED WITH: YOCUM R,RN 12/06/16 0403 WAYK    Chloride 94 (L) 101 - 111 mmol/L   CO2 24 22 - 32 mmol/L   Glucose, Bld 273 (H) 65 - 99 mg/dL   BUN 142 (H) 6 - 20 mg/dL   Creatinine, Ser 3.06 (H) 0.44 - 1.00 mg/dL   Calcium 8.4 (L) 8.9 - 10.3 mg/dL   GFR calc non Af Amer 15 (L) >60 mL/min   GFR calc Af Wyvonnia Lora  17 (L) >60 mL/min    Comment: (NOTE) The eGFR has been calculated using the CKD EPI  equation. This calculation has not been validated in all clinical situations. eGFR's persistently <60 mL/min signify possible Chronic Kidney Disease.    Anion gap 11 5 - 15  CBC     Status: Abnormal   Collection Time: 12/06/16  3:03 AM  Result Value Ref Range   WBC 7.8 4.0 - 10.5 K/uL   RBC 4.04 3.87 - 5.11 MIL/uL   Hemoglobin 10.7 (L) 12.0 - 15.0 g/dL   HCT 35.9 (L) 36.0 - 46.0 %   MCV 88.9 78.0 - 100.0 fL   MCH 26.5 26.0 - 34.0 pg   MCHC 29.8 (L) 30.0 - 36.0 g/dL   RDW 17.1 (H) 11.5 - 15.5 %   Platelets 122 (L) 150 - 400 K/uL  Glucose, capillary     Status: Abnormal   Collection Time: 12/06/16  6:08 AM  Result Value Ref Range   Glucose-Capillary 281 (H) 65 - 99 mg/dL  Potassium     Status: Abnormal   Collection Time: 12/06/16  7:14 AM  Result Value Ref Range   Potassium 7.1 (HH) 3.5 - 5.1 mmol/L    Comment: CRITICAL RESULT CALLED TO, READ BACK BY AND VERIFIED WITH: RELYEAL,R. RN @ [phone] 12/06/16 BY EDENS,C.   Glucose, capillary     Status: Abnormal   Collection Time: 12/06/16  7:32 AM  Result Value Ref Range   Glucose-Capillary 320 (H) 65 - 99 mg/dL  Glucose, capillary     Status: Abnormal   Collection Time: 12/06/16  8:00 AM  Result Value Ref Range   Glucose-Capillary 393 (H) 65 - 99 mg/dL   Comment 1 Notify RN    Comment 2 Document in Chart   Glucose, capillary     Status: Abnormal   Collection Time: 12/06/16 11:29 AM  Result Value Ref Range   Glucose-Capillary 347 (H) 65 - 99 mg/dL   Comment 1 Notify RN   MRSA PCR Screening     Status: None   Collection Time: 12/06/16 11:33 AM  Result Value Ref Range   MRSA by PCR NEGATIVE NEGATIVE    Comment:        The GeneXpert MRSA Assay (FDA approved for NASAL specimens only), is one component of a comprehensive MRSA colonization surveillance program. It is not intended to diagnose MRSA infection nor to guide or monitor treatment for MRSA infections.   Potassium     Status: Abnormal   Collection Time: 12/06/16   2:50 PM  Result Value Ref Range   Potassium 5.4 (H) 3.5 - 5.1 mmol/L  Glucose, capillary     Status: Abnormal   Collection Time: 12/06/16  3:46 PM  Result Value Ref Range   Glucose-Capillary 232 (H) 65 - 99 mg/dL   Comment 1 Document in Chart   Potassium     Status: None   Collection Time: 12/06/16  8:15 PM  Result Value Ref Range   Potassium 4.6 3.5 - 5.1 mmol/L  Glucose, capillary     Status: Abnormal   Collection Time: 12/06/16 10:37 PM  Result Value Ref Range   Glucose-Capillary 217 (H) 65 - 99 mg/dL   Comment 1 Notify RN    Comment 2 Document in Chart   CBC     Status: Abnormal   Collection Time: 12/07/16  1:59 AM  Result Value Ref Range   WBC 5.7 4.0 - 10.5 K/uL   RBC 3.58 (L) 3.87 -  5.11 MIL/uL   Hemoglobin 9.3 (L) 12.0 - 15.0 g/dL   HCT 31.0 (L) 36.0 - 46.0 %   MCV 86.6 78.0 - 100.0 fL   MCH 26.0 26.0 - 34.0 pg   MCHC 30.0 30.0 - 36.0 g/dL   RDW 16.4 (H) 11.5 - 15.5 %   Platelets 112 (L) 150 - 400 K/uL    Comment: PLATELET COUNT CONFIRMED BY SMEAR  Renal function panel     Status: Abnormal   Collection Time: 12/07/16  1:59 AM  Result Value Ref Range   Sodium 134 (L) 135 - 145 mmol/L   Potassium 4.5 3.5 - 5.1 mmol/L   Chloride 98 (L) 101 - 111 mmol/L   CO2 24 22 - 32 mmol/L   Glucose, Bld 207 (H) 65 - 99 mg/dL   BUN 153 (H) 6 - 20 mg/dL   Creatinine, Ser 2.77 (H) 0.44 - 1.00 mg/dL   Calcium 8.6 (L) 8.9 - 10.3 mg/dL   Phosphorus 8.6 (H) 2.5 - 4.6 mg/dL   Albumin 2.4 (L) 3.5 - 5.0 g/dL   GFR calc non Af Amer 17 (L) >60 mL/min   GFR calc Af Amer 20 (L) >60 mL/min    Comment: (NOTE) The eGFR has been calculated using the CKD EPI equation. This calculation has not been validated in all clinical situations. eGFR's persistently <60 mL/min signify possible Chronic Kidney Disease.    Anion gap 12 5 - 15  Glucose, capillary     Status: Abnormal   Collection Time: 12/07/16  8:21 AM  Result Value Ref Range   Glucose-Capillary 136 (H) 65 - 99 mg/dL   Comment 1  Notify RN    Dg Chest 2 View  Result Date: 11/21/2016 CLINICAL DATA:  Generalize weakness for several days. History of CHF, coronary artery disease and previous MI and, COPD, former smoker. EXAM: CHEST  2 VIEW COMPARISON:  None in PACs FINDINGS: There is a moderate-sized right pleural effusion. There is no pleural effusion on the left. The left lung is well-expanded. Both lungs exhibit increased interstitial density. The cardiac silhouette is enlarged. The pulmonary vascularity is engorged. The patient has undergone previous median sternotomy and CABG. There is calcification in the wall of the aortic arch. The observed bony thorax is unremarkable. IMPRESSION: COPD. CHF with pulmonary interstitial edema. Moderate-sized right pleural effusion. There may be right basilar atelectasis or pneumonia. Thoracic aortic atherosclerosis. Electronically Signed   By: David  Martinique M.D.   On: 11/19/2016 14:10   US Renal  Result Date: 11/28/2016 CLINICAL DATA:  Acute on chronic kidney injury. EXAM: RENAL / URINARY TRACT ULTRASOUND COMPLETE COMPARISON:  None. FINDINGS: Right Kidney: Length: 11.1 cm. Mildly increased echogenicity and moderate parenchymal thinning, consistent with medical renal disease. No hydronephrosis. No suspicious focal parenchymal lesion. Left Kidney: Length: 11.0 cm. Mildly increased echogenicity and moderate parenchymal thinning, consistent with medical renal disease. No hydronephrosis. No suspicious focal parenchymal lesion. Bladder: Appears normal for degree of bladder distention. IMPRESSION: Atrophic appearing kidneys with increased echogenicity consistent with medical renal disease. No hydronephrosis. Electronically Signed   By: Andreas Newport M.D.   On: 12/04/2016 23:17    XHF:SFSELTR:VU colds or fevers, no weight changes Skin:no rashes or ulcers HEENT:no blurred vision, no congestion CV:see HPI PUL:see HPI GI:no diarrhea constipation or melena, no indigestion GU:no hematuria, no  dysuria MS:rt elbow joint pain and rt leg with gout, no claudication Neuro:no syncope, no lightheadedness Endo:+ diabetes controlled to improved control per husband., no thyroid disease  Blood  pressure (!) 108/53, pulse 72, temperature 98.8 F (37.1 C), temperature source Oral, resp. rate 19, height 5' 7"  (1.702 m), weight 273 lb 5.9 oz (124 kg), SpO2 94 %.  Wt Readings from Last 3 Encounters:  12/07/16 273 lb 5.9 oz (124 kg)  07/17/16 242 lb (109.8 kg)  06/03/16 247 lb 8 oz (112.3 kg)    PE: General:Pleasant affect, but sleepy, NAD Skin:Warm and dry, brisk capillary refill, cheeks flushed HEENT:normocephalic, sclera clear, mucus membranes moist Neck:supple, no JVD, no bruits  Heart:S1S2 RRR without murmur, gallup, rub or click Lungs: with few rales, rhonchi, or wheezes listening ant BHA:LPFXT,KWIO, non tender, + BS, do not palpate liver spleen or masses Ext:3+ lower ext edema, edema into her abd. 1+ pedal pulses, 2+ radial pulses Neuro:alert and oriented X 3 but sleepy, MAE, follows commands, + facial symmetry   Assessment/Plan Principal Problem:   Acute renal failure superimposed on stage 3 chronic kidney disease (HCC) Active Problems:   Diabetes mellitus with renal complications (HCC)   HLD (hyperlipidemia)   Anxiety state   Essential hypertension   Acute on chronic systolic heart failure (HCC)   COPD with exacerbation (HCC)   Hyperkalemia   Gout   Cardiomyopathy (HCC)   CKD (chronic kidney disease) stage 3, GFR 30-59 ml/min   Elevated troponin   Elevated troponin- flat and with acute renal failure not sure that show heart damage. Dr. Meda Coffee to see.   Acute on chronic HF with BNP at 991 and pul. Edema on CXR. Now on lasix 120 mg IV every 6 hours per renal.  To start today.   LV dysfunction increased.  Now 40% EF and more localized and severe hypokinesis.  CAD wit hx CABG and last cath 2011 with patent grafts  Acute on chronic renal failure. Per renal  Anemia -  per IM  HTN controlled  COPD on steroids and Nebs.  Per IM  DM per IM  Cecilie Kicks  Nurse Practitioner Certified [place] Pager [phone] or after 5pm or weekends call [phone] 12/07/2016, 11:01 AM   The patient was seen, examined and discussed with Cecilie Kicks, NP and I agree with the above.   57 yof with hx CAD, CABG in 2003 and P a fib. LHC (10/2010): 6 bypass grafts patent,Severe disease in the proximal portion of the previously stented circumflex vessel that is unchanged from films in 2008 but is unprotected by a vein graft. Medical therapy was recommended. Echo (10/2010): EF 45% to 50%. Diffuse hypokinesis. Mitral valve: Mild regurgitation.  Carotid US (5/16): 40-59% bilateral.   Other hx of COPD, CKD, DM, HTN, HLD,  The patient was admitted with acute mental status changes and found to be in acute on chronic kidney failure Crea 1.4 --> 3.0, stage 4, acute hypoxic respiratory failure and acute on chronic combined systolic and diastolic kidney failure. The patient has minimally elevated troponin with flat trend sec to kidney failure and CHF.  The patient is currently anuric, on Lasix 120 mg IV Q6H. She was offered HD however her daughter from complications of HD and she is adamant that she doesn't was any HD, even short term. She has 30 extra lbs, in August 243 lbs, now 273 lbs, in anasarca.  Repeat echo shows LVEF 40, previously 45% in 2011.  I have offered a placement of picc line in order to obtain Co-ox and possibly use Dopamine to increase renal perfusion, she is not interested, but wants to think about it.  At this point no ischemic work up is needed, she urgently needs HD. Not much we can offer here, we will follow.   Ena Dawley 12/07/2016

## 2016-12-07 NOTE — Progress Notes (Signed)
Report was given. Pt transported on the monitor to 3W37. Transferred with one assist from bed to bed. No SOB, or complication noted. Family at the bedside. Again, encouraged to remind of importance of turning. Belongings with the family.

## 2016-12-07 NOTE — Progress Notes (Signed)
Pt c/o having the urge to void with foley cath present. Bladder scan done, 24 ml residual noted. 50 ml in foley bag, no kinks noted.

## 2016-12-07 NOTE — Progress Notes (Signed)
Pt refused to be turned to assess skin for pressure sore.

## 2016-12-08 DIAGNOSIS — I420 Dilated cardiomyopathy: Secondary | ICD-10-CM

## 2016-12-08 DIAGNOSIS — N178 Other acute kidney failure: Secondary | ICD-10-CM

## 2016-12-08 LAB — CBC
HEMATOCRIT: 31.4 % — AB (ref 36.0–46.0)
HEMOGLOBIN: 9.5 g/dL — AB (ref 12.0–15.0)
MCH: 26.1 pg (ref 26.0–34.0)
MCHC: 30.3 g/dL (ref 30.0–36.0)
MCV: 86.3 fL (ref 78.0–100.0)
Platelets: 111 10*3/uL — ABNORMAL LOW (ref 150–400)
RBC: 3.64 MIL/uL — ABNORMAL LOW (ref 3.87–5.11)
RDW: 16.4 % — ABNORMAL HIGH (ref 11.5–15.5)
WBC: 7.3 10*3/uL (ref 4.0–10.5)

## 2016-12-08 LAB — RENAL FUNCTION PANEL
ANION GAP: 11 (ref 5–15)
Albumin: 2.5 g/dL — ABNORMAL LOW (ref 3.5–5.0)
BUN: 158 mg/dL — ABNORMAL HIGH (ref 6–20)
CALCIUM: 8.3 mg/dL — AB (ref 8.9–10.3)
CHLORIDE: 96 mmol/L — AB (ref 101–111)
CO2: 25 mmol/L (ref 22–32)
Creatinine, Ser: 3.34 mg/dL — ABNORMAL HIGH (ref 0.44–1.00)
GFR calc Af Amer: 16 mL/min — ABNORMAL LOW (ref >60)
GFR calc non Af Amer: 13 mL/min — ABNORMAL LOW (ref >60)
GLUCOSE: 121 mg/dL — AB (ref 65–99)
POTASSIUM: 4.2 mmol/L (ref 3.5–5.1)
Phosphorus: 8.3 mg/dL — ABNORMAL HIGH (ref 2.5–4.6)
SODIUM: 132 mmol/L — AB (ref 135–145)

## 2016-12-08 LAB — GLUCOSE, CAPILLARY
GLUCOSE-CAPILLARY: 197 mg/dL — AB (ref 65–99)
Glucose-Capillary: 163 mg/dL — ABNORMAL HIGH (ref 65–99)
Glucose-Capillary: 145 mg/dL — ABNORMAL HIGH (ref 65–99)
Glucose-Capillary: 129 mg/dL — ABNORMAL HIGH (ref 65–99)

## 2016-12-08 LAB — IRON AND TIBC
IRON: 14 ug/dL — AB (ref 28–170)
Saturation Ratios: 5 % — ABNORMAL LOW (ref 10.4–31.8)
TIBC: 311 ug/dL (ref 250–450)
UIBC: 297 ug/dL

## 2016-12-08 MED ORDER — ALBUTEROL SULFATE (2.5 MG/3ML) 0.083% IN NEBU: 2.5000 mg | INHALATION_SOLUTION | Freq: Four times a day (QID) | RESPIRATORY_TRACT | Status: DC | PRN

## 2016-12-08 MED ORDER — DARBEPOETIN ALFA 150 MCG/0.3ML IJ SOSY
150.0000 ug | PREFILLED_SYRINGE | INTRAMUSCULAR | Status: DC
  Administered 2016-12-09: 150 ug via SUBCUTANEOUS
  Filled 2016-12-08: qty 0.3

## 2016-12-08 MED ORDER — ASPIRIN EC 81 MG PO TBEC
81.0000 mg | DELAYED_RELEASE_TABLET | Freq: Every day | ORAL | Status: DC
  Administered 2016-12-09 – 2016-12-11 (×3): 81 mg via ORAL
  Filled 2016-12-08 (×3): qty 1

## 2016-12-08 NOTE — Progress Notes (Signed)
[facility] ROUNDING NOTE   Subjective:   Interval History:  Some urine output and response to high dose diuretics  She consistently refuses dialysis  EF 40%   Objective:  Vital signs in last 24 hours:  Temp:  [97.4 F (36.3 C)-97.6 F (36.4 C)] 97.4 F (36.3 C) (12/25 0732) Pulse Rate:  [68-78] 72 (12/25 0732) Resp:  [14-24] 14 (12/25 0732) BP: (94-125)/(31-77) 106/48 (12/25 0732) SpO2:  [90 %-98 %] 96 % (12/25 0828) Weight:  [123.7 kg (272 lb 12.8 oz)] 123.7 kg (272 lb 12.8 oz) (12/25 0400)  Weight change: -0.259 kg (-9.1 oz) Filed Weights   12/06/16 0528 12/07/16 0500 12/08/16 0400  Weight: 121.4 kg (267 lb 9.6 oz) 124 kg (273 lb 5.9 oz) 123.7 kg (272 lb 12.8 oz)    Intake/Output: I/O last 3 completed shifts: In: 932 [P.O.:660; I.V.:200; Other:10; IV Piggyback:62] Out: 920 [Urine:920]   Intake/Output this shift:  Total I/O In: 120 [P.O.:120] Out: -   CVS- RRR RS- CTA ABD- BS present soft non-distended EXT-  2+  edema   Basic Metabolic Panel:  Recent Labs Lab 12/11/2016 1306 12/11/2016 1645 12/06/2016 2058 12/06/16 0303 12/06/16 0714 12/06/16 1450 12/06/16 2015 12/07/16 0159 12/08/16 0320  NA 130*  --   --  129*  --   --   --  134* 132*  K 5.9*  --  6.0* >7.5* 7.1* 5.4* 4.6 4.5 4.2  CL 97*  --   --  94*  --   --   --  98* 96*  CO2 22  --   --  24  --   --   --  24 25  GLUCOSE 142*  --   --  273*  --   --   --  207* 121*  BUN 139*  --   --  142*  --   --   --  153* 158*  CREATININE 3.12* 2.96*  --  3.06*  --   --   --  2.77* 3.34*  CALCIUM 8.3*  --   --  8.4*  --   --   --  8.6* 8.3*  MG  --   --  2.9*  --   --   --   --   --   --   PHOS  --   --  7.0*  --   --   --   --  8.6* 8.3*    Liver Function Tests:  Recent Labs Lab 11/22/2016 1306 12/07/16 0159 12/08/16 0320  AST 22  --   --   ALT 19  --   --   ALKPHOS 177*  --   --   BILITOT 0.5  --   --   PROT 6.8  --   --   ALBUMIN 2.5* 2.4* 2.5*   No results for input(s): LIPASE,  AMYLASE in the last 168 hours. No results for input(s): AMMONIA in the last 168 hours.  CBC:  Recent Labs Lab 12/02/2016 1306 12/06/16 0303 12/07/16 0159 12/08/16 0320  WBC 8.8 7.8 5.7 7.3  NEUTROABS 7.6  --   --   --   HGB 10.6* 10.7* 9.3* 9.5*  HCT 34.5* 35.9* 31.0* 31.4*  MCV 85.8 88.9 86.6 86.3  PLT 132* 122* 112* 111*    Cardiac Enzymes:  Recent Labs Lab 11/18/2016 1306 12/09/2016 1645 11/20/2016 2058 12/06/16 0303  TROPONINI 0.03* 0.04* 0.04* 0.04*    BNP: Invalid input(s): POCBNP  CBG:  Recent Labs  Lab 12/07/16 1220 12/07/16 1625 12/07/16 2104 12/08/16 0731 12/08/16 1141  GLUCAP 145* 186* 148* 163* 145*    Microbiology: Results for orders placed or performed during the hospital encounter of 12/07/2016  MRSA PCR Screening     Status: None   Collection Time: 12/06/16 11:33 AM  Result Value Ref Range Status   MRSA by PCR NEGATIVE NEGATIVE Final    Comment:        The GeneXpert MRSA Assay (FDA approved for NASAL specimens only), is one component of a comprehensive MRSA colonization surveillance program. It is not intended to diagnose MRSA infection nor to guide or monitor treatment for MRSA infections.     Coagulation Studies: No results for input(s): LABPROT, INR in the last 72 hours.  Urinalysis:  Recent Labs  12/06/16 0142  COLORURINE YELLOW  LABSPEC 1.010  PHURINE 5.0  GLUCOSEU NEGATIVE  HGBUR NEGATIVE  BILIRUBINUR NEGATIVE  KETONESUR NEGATIVE  PROTEINUR NEGATIVE  NITRITE NEGATIVE  LEUKOCYTESUR NEGATIVE      Imaging: No results found.   Medications:    . [START ON 12/09/2016] aspirin EC  81 mg Oral Daily  . furosemide  120 mg Intravenous Q6H  . heparin  5,000 Units Subcutaneous Q8H  . insulin aspart  0-5 Units Subcutaneous QHS  . insulin aspart  0-9 Units Subcutaneous TID WC  . insulin glargine  40 Units Subcutaneous Daily  . mouth rinse  15 mL Mouth Rinse BID  . sodium chloride flush  3 mL Intravenous Q12H   sodium  chloride, acetaminophen, albuterol, nitroGLYCERIN, ondansetron (ZOFRAN) IV, sodium chloride flush  Assessment/ Plan:  Assessment/Plan:65 year old female with multiple medical issues including coronary artery disease, CHF CABG in 2003 and P a fib. LHC (10/2010)and diabetes presenting with acute kidney injury  1.Renal-acute kidney injury with creatinine on 12/4 of 1.43 . I suspect ATN secondary to low blood pressure on Vasotec in the setting of diabetic nephropathy.  She developed life threatining hyperkalemia and would not use any ACE or ARBs  2. Hypertension/volume - patient is volume overloaded with a lowish blood pressure. I agree with IV Lasix for now and continue to reassess situation  The prognosis is going to be poor.  3. Hyperkalemia - Resolved medical therapy only  4. Anemia- not a significant issue at this time  Hb 9's would check iron stores and give ESA  5. Bones  Will check PTH   LOS: 2 Cassandra Fisher W @TODAY @12 :37 PM

## 2016-12-08 NOTE — Progress Notes (Signed)
PROGRESS NOTE                                                                                                                                                                                                             Patient Demographics:    Cassandra Fisher, is a 65 y.o. female, DOB - 08/25/1951, ZOX:[id number]  Admit date - 20-Jun-2016   Admitting Physician Haydee SalterPhillip M Hobbs, MD  Outpatient Primary MD for the patient is [name], MD  LOS - 2   Chief Complaint  Patient presents with  . Cough    pt has had a cough for the past three weeks increasing cough for the past 3 days        Brief Narrative   65 y.o. female with a Past Medical History negative for chronic kidney disease, diabetes, hypertension and CHF who presents with weakness, workup significant for acute renal failure, as well hyperkalemia, Significant volume overload, potassium> 7.5, with new LBBB, patient refused hemodialysis.   Subjective:    Cassandra MediaPaula Fisher today has, No headache, No abdominal pain , she denies any chest pain, Still reports dyspnea   Assessment  & Plan :    Principal Problem:   Acute renal failure superimposed on stage 3 chronic kidney disease (HCC) Active Problems:   Diabetes mellitus with renal complications (HCC)   HLD (hyperlipidemia)   Anxiety state   Essential hypertension   Acute on chronic systolic heart failure (HCC)   COPD with exacerbation (HCC)   Hyperkalemia   Gout   Cardiomyopathy (HCC)   CKD (chronic kidney disease) stage 3, GFR 30-59 ml/min   Elevated troponin   Acute renal failure and CKD stage III - Baseline creatinine 1.5, with worsening creatinine in the setting of ATN secondary to low blood pressure on vasotec in the setting of diabetic nephropathy. - renal input greatly appreciated , continue to hold nephrotoxic medication, continue with diuresis for volume overload, Not much response for large dose of IV Lasix 120 mg IV every 6 hours ,  urine output is only 4 50 mL over last 24 hours , D/W with patient and her husband again today that she needs dialysis for her volume management, she still refusing dialysis, her prognosis is very poor.  Hyperkalemia - Secondary to renal failure, refusing hemodialysis, holding Vasotec, received Kayexalate, calcium gluconate, D50/IV insulin,  albuterol, corrected with medical management.  Left bundle branch block - Continue to hyperkalemia, new onset, continue to monitor on telemetry, repeat EKG today..  Acute Hypoxic respiratory failure - Secondary to volume overload, related to renal failure versus CHF - Continue with oxygen as needed, continue to diuresis aggressively - Will have one BiPAP when necessary.  COPD - No active wheezing, continue with nebs as needed  Acute on chronic systolic heart failure - Most recent echo in 2011 EF 45%, repeat 2-D echo with EF 40%, severe hypokinesis of the mid anterior septal myocardium. - Continue with IV diuresis, continue to hold ACE inhibitor in the setting of renal failure, continue to hold beta blocker in the setting of soft blood pressure - Cardiology consulted to assist with further management - Patient still refusing hemodialysis  Elevated troponin - Likely demand ischemia in the setting of renal failure, hypoxia and respiratory distress, upon a trend is non-ACS pattern 0.03> 0.04> 0.04 - Her left bundle branch block related to hyperkalemia  Diabetes mellitus - Patient on glipizide and insulin NPH 82 units twice a day , Continue with insulin sliding scale, maintenance control, acceptable  on Lantus 40 units subcutaneous daily. - Follow on hemoglobin A1c  Code Status : Full code, patient at high risk for cardiopulmonary arrest with her continue to refusing hemodialysis.  Family Communication  : D/W patient , and husband  at bedside today.  Disposition Plan  : Pending further work up.  Consults  :  Renal, cardiology  Procedures  :  None  DVT Prophylaxis  : Heparin - SCD  Lab Results  Component Value Date   PLT 111 (L) 12/08/2016    Antibiotics  :   Anti-infectives    None        Objective:   Vitals:   12/08/16 0400 12/08/16 0732 12/08/16 0828 12/08/16 1312  BP: 100/70 (!) 106/48  (!) 110/52  Pulse: 69 72  72  Resp: 16 14  (!) 22  Temp: 97.4 F (36.3 C) 97.4 F (36.3 C)  97.6 F (36.4 C)  TempSrc:  Oral  Oral  SpO2: 96% 97% 96% 90%  Weight: 123.7 kg (272 lb 12.8 oz)     Height:        Wt Readings from Last 3 Encounters:  12/08/16 123.7 kg (272 lb 12.8 oz)  07/17/16 109.8 kg (242 lb)  06/03/16 112.3 kg (247 lb 8 oz)     Intake/Output Summary (Last 24 hours) at 12/08/16 1529 Last data filed at 12/08/16 0920  Gross per 24 hour  Intake              602 ml  Output              350 ml  Net              252 ml     Physical Exam  Awake Alert, Oriented X 3, Normal affect Supple Neck,No JVD  Symmetrical Chest wall movement, Diminished air movement bilaterally at bases, no wheezing. RRR,No Gallops,Rubs or new Murmurs, No Parasternal Heave +ve B.Sounds, Abd Soft, No tenderness,  No rebound - guarding or rigidity. No Cyanosis,  No new Rash or bruise, anasarca   Data Review:    CBC  Recent Labs Lab 12/14/2016 1306 12/06/16 0303 12/07/16 0159 12/08/16 0320  WBC 8.8 7.8 5.7 7.3  HGB 10.6* 10.7* 9.3* 9.5*  HCT 34.5* 35.9* 31.0* 31.4*  PLT 132* 122* 112* 111*  MCV 85.8 88.9 86.6 86.3  MCH 26.4 26.5 26.0 26.1  MCHC 30.7 29.8* 30.0 30.3  RDW 16.6* 17.1* 16.4* 16.4*  LYMPHSABS 0.8  --   --   --   MONOABS 0.4  --   --   --   EOSABS 0.1  --   --   --   BASOSABS 0.0  --   --   --     Chemistries   Recent Labs Lab Dec 22, 2016 1306 12-22-16 1645 12/22/16 2058 12/06/16 0303 12/06/16 0714 12/06/16 1450 12/06/16 2015 12/07/16 0159 12/08/16 0320  NA 130*  --   --  129*  --   --   --  134* 132*  K 5.9*  --  6.0* >7.5* 7.1* 5.4* 4.6 4.5 4.2  CL 97*  --   --  94*  --   --   --  98* 96*   CO2 22  --   --  24  --   --   --  24 25  GLUCOSE 142*  --   --  273*  --   --   --  207* 121*  BUN 139*  --   --  142*  --   --   --  153* 158*  CREATININE 3.12* 2.96*  --  3.06*  --   --   --  2.77* 3.34*  CALCIUM 8.3*  --   --  8.4*  --   --   --  8.6* 8.3*  MG  --   --  2.9*  --   --   --   --   --   --   AST 22  --   --   --   --   --   --   --   --   ALT 19  --   --   --   --   --   --   --   --   ALKPHOS 177*  --   --   --   --   --   --   --   --   BILITOT 0.5  --   --   --   --   --   --   --   --    ------------------------------------------------------------------------------------------------------------------ No results for input(s): CHOL, HDL, LDLCALC, TRIG, CHOLHDL, LDLDIRECT in the last 72 hours.  Lab Results  Component Value Date   HGBA1C 7.1 (H) 12/22/2016   ------------------------------------------------------------------------------------------------------------------ No results for input(s): TSH, T4TOTAL, T3FREE, THYROIDAB in the last 72 hours.  Invalid input(s): FREET3 ------------------------------------------------------------------------------------------------------------------  Recent Labs  12/08/16 1327  TIBC 311  IRON 14*    Coagulation profile No results for input(s): INR, PROTIME in the last 168 hours.  No results for input(s): DDIMER in the last 72 hours.  Cardiac Enzymes  Recent Labs Lab 2016/12/22 1645 December 22, 2016 2058 12/06/16 0303  TROPONINI 0.04* 0.04* 0.04*   ------------------------------------------------------------------------------------------------------------------    Component Value Date/Time   BNP 991.2 (H) 2016-12-22 1306    Inpatient Medications  Scheduled Meds: . [START ON 12/09/2016] aspirin EC  81 mg Oral Daily  . [START ON 12/09/2016] darbepoetin (ARANESP) injection - NON-DIALYSIS  150 mcg Subcutaneous Q Tue-1800  . furosemide  120 mg Intravenous Q6H  . heparin  5,000 Units Subcutaneous Q8H  . insulin aspart   0-5 Units Subcutaneous QHS  . insulin aspart  0-9 Units Subcutaneous TID WC  . insulin glargine  40 Units Subcutaneous Daily  . mouth rinse  15 mL Mouth Rinse  BID  . sodium chloride flush  3 mL Intravenous Q12H   Continuous Infusions: PRN Meds:.sodium chloride, acetaminophen, albuterol, nitroGLYCERIN, ondansetron (ZOFRAN) IV, sodium chloride flush  Micro Results Recent Results (from the past 240 hour(s))  MRSA PCR Screening     Status: None   Collection Time: 12/06/16 11:33 AM  Result Value Ref Range Status   MRSA by PCR NEGATIVE NEGATIVE Final    Comment:        The GeneXpert MRSA Assay (FDA approved for NASAL specimens only), is one component of a comprehensive MRSA colonization surveillance program. It is not intended to diagnose MRSA infection nor to guide or monitor treatment for MRSA infections.     Radiology Reports Dg Chest 2 View  Result Date: December 07, 2016 CLINICAL DATA:  Generalize weakness for several days. History of CHF, coronary artery disease and previous MI and, COPD, former smoker. EXAM: CHEST  2 VIEW COMPARISON:  None in PACs FINDINGS: There is a moderate-sized right pleural effusion. There is no pleural effusion on the left. The left lung is well-expanded. Both lungs exhibit increased interstitial density. The cardiac silhouette is enlarged. The pulmonary vascularity is engorged. The patient has undergone previous median sternotomy and CABG. There is calcification in the wall of the aortic arch. The observed bony thorax is unremarkable. IMPRESSION: COPD. CHF with pulmonary interstitial edema. Moderate-sized right pleural effusion. There may be right basilar atelectasis or pneumonia. Thoracic aortic atherosclerosis. Electronically Signed   By: David  [place] M.D.   On: 12/07/2016 14:10   US Renal  Result Date: 12-07-16 CLINICAL DATA:  Acute on chronic kidney injury. EXAM: RENAL / URINARY TRACT ULTRASOUND COMPLETE COMPARISON:  None. FINDINGS: Right Kidney: Length:  11.1 cm. Mildly increased echogenicity and moderate parenchymal thinning, consistent with medical renal disease. No hydronephrosis. No suspicious focal parenchymal lesion. Left Kidney: Length: 11.0 cm. Mildly increased echogenicity and moderate parenchymal thinning, consistent with medical renal disease. No hydronephrosis. No suspicious focal parenchymal lesion. Bladder: Appears normal for degree of bladder distention. IMPRESSION: Atrophic appearing kidneys with increased echogenicity consistent with medical renal disease. No hydronephrosis. Electronically Signed   By: Ellery Plunk M.D.   On: 12/07/2016 23:17      Lynea Rollison M.D on 12/08/2016 at 3:29 PM  Between 7am to 7pm - Pager - [phone]  After 7pm go to [email or web address] - password [facility]  [facility] -  Office  [phone]

## 2016-12-08 NOTE — Progress Notes (Signed)
Patient Name: Cassandra Fisher      SUBJECTIVE: 65 yo female admitted wit weak ness SOB pulm edema on CXRand productive cough and found to have acute renal failure cx by hyperkalemia.Ralene Muskrat neg  Was on enalapril and ?? High dose ASA.  She has refused  HD and PICC ,  Seen by renal yday.  Unfortunately Cr continues to worsen 3.0>>2.8>>3.4   Echo with EF 40% and severe hypokinesis of midanteroseptal myocardium -new;  Mild to mod AS, moderate posterior MAC mild MR, LA mildly dilated, PA pk pressure 33 mmHg no pericardial effusion. Moderately reduced right ventricular contraction  Feels a little better today No Chest pain Back huting      Past Medical History:  Diagnosis Date  . Anxiety   . Arthritis   . Atrial fibrillation (HCC)   . CAD (coronary artery disease)   . CHF (congestive heart failure) (HCC)   . COPD (chronic obstructive pulmonary disease) (HCC)   . DM (diabetes mellitus) (HCC)   . Gout   . HTN (hypertension)   . Hx of CABG   . Hyperlipidemia   . Kidney stones    x 3  . MI (myocardial infarction)   . Obesity   . Vertigo     Scheduled Meds:  Scheduled Meds: . aspirin EC  325 mg Oral Daily  . furosemide  120 mg Intravenous Q6H  . heparin  5,000 Units Subcutaneous Q8H  . insulin aspart  0-5 Units Subcutaneous QHS  . insulin aspart  0-9 Units Subcutaneous TID WC  . insulin glargine  40 Units Subcutaneous Daily  . mouth rinse  15 mL Mouth Rinse BID  . sodium chloride flush  3 mL Intravenous Q12H   Continuous Infusions: sodium chloride, acetaminophen, albuterol, nitroGLYCERIN, ondansetron (ZOFRAN) IV, sodium chloride flush    PHYSICAL EXAM Vitals:   12/08/16 0355 12/08/16 0400 12/08/16 0732 12/08/16 0828  BP: 100/70 100/70 (!) 106/48   Pulse: 71 69 72   Resp: 16 16 14    Temp:  97.4 F (36.3 C) 97.4 F (36.3 C)   TempSrc:   Oral   SpO2: 93% 96% 97% 96%  Weight:  272 lb 12.8 oz (123.7 kg)    Height:       Well developed and Morbidly obese  with back  HENT normal Neck supple   Crackles Regular rate and rhythm, no murmurs or gallops Abd-soft with active BS No Clubbing cyanosis 1+ edema Skin-warm and dry A & Oriented  Grossly normal sensory and motor function  TELEMETRY: Reviewed personnally pt in *NSR      Intake/Output Summary (Last 24 hours) at 12/08/16 1119 Last data filed at 12/08/16 0920  Gross per 24 hour  Intake              772 ml  Output              420 ml  Net              352 ml    LABS: Basic Metabolic Panel:  Recent Labs Lab 12/11/2016 1306 12/08/2016 1645  12/03/2016 2058 12/06/16 0303 12/06/16 0714 12/06/16 1450 12/06/16 2015 12/07/16 0159 12/08/16 0320  NA 130*  --   --   --  129*  --   --   --  134* 132*  K 5.9*  --   --  6.0* >7.5* 7.1* 5.4* 4.6 4.5 4.2  CL 97*  --   --   --  94*  --   --   --  98* 96*  CO2 22  --   --   --  24  --   --   --  24 25  GLUCOSE 142*  --   --   --  273*  --   --   --  207* 121*  BUN 139*  --   --   --  142*  --   --   --  153* 158*  CREATININE 3.12* 2.96*  --   --  3.06*  --   --   --  2.77* 3.34*  CALCIUM 8.3*  --   --   --  8.4*  --   --   --  8.6* 8.3*  MG  --   --   --  2.9*  --   --   --   --   --   --   PHOS  --   --   < > 7.0*  --   --   --   --  8.6* 8.3*  < > = values in this interval not displayed. Cardiac Enzymes:  Recent Labs  01/30/2016 1645 01/30/2016 2058 12/06/16 0303  TROPONINI 0.04* 0.04* 0.04*   CBC:  Recent Labs Lab 01/30/2016 1306 12/06/16 0303 12/07/16 0159 12/08/16 0320  WBC 8.8 7.8 5.7 7.3  NEUTROABS 7.6  --   --   --   HGB 10.6* 10.7* 9.3* 9.5*  HCT 34.5* 35.9* 31.0* 31.4*  MCV 85.8 88.9 86.6 86.3  PLT 132* 122* 112* 111*   PROTIME: No results for input(s): LABPROT, INR in the last 72 hours. Liver Function Tests:  Recent Labs  01/30/2016 1306 12/07/16 0159 12/08/16 0320  AST 22  --   --   ALT 19  --   --   ALKPHOS 177*  --   --   BILITOT 0.5  --   --   PROT 6.8  --   --   ALBUMIN 2.5* 2.4* 2.5*   No results  for input(s): LIPASE, AMYLASE in the last 72 hours. BNP: BNP (last 3 results)  Recent Labs  01/30/2016 1306  BNP 991.2*    ProBNP (last 3 results) No results for input(s): PROBNP in the last 8760 hours.  D-Dimer: No results for input(s): DDIMER in the last 72 hours. Hemoglobin A1C:  Recent Labs  01/30/2016 1306  HGBA1C 7.1*      ASSESSMENT AND PLAN:  Principal Problem:   Acute renal failure superimposed on stage 3 chronic kidney disease (HCC)   Now class 5 Active Problems:   Diabetes mellitus with renal complications (HCC)   HLD (hyperlipidemia)   Anxiety state   Essential hypertension   Acute on chronic systolic heart failure (HCC)   COPD with exacerbation (HCC)   Hyperkalemia   Gout   Cardiomyopathy (HCC)   CKD (chronic kidney disease) stage 3, GFR 30-59 ml/min   Elevated troponin  Will decrease ASA-- not sure of indiction for 325 vs 81  K stable NOt much for us to do today.   Discussed with her that short term dialysis might suffice and asked her to consider this possibility  Am not sure PICC line as valuable as HD ( if we get there)    Signed, Sherryl MangesSteven Finlee Milo MD  12/08/2016

## 2016-12-09 DIAGNOSIS — Z7189 Other specified counseling: Secondary | ICD-10-CM

## 2016-12-09 LAB — GLUCOSE, CAPILLARY
GLUCOSE-CAPILLARY: 99 mg/dL (ref 65–99)
GLUCOSE-CAPILLARY: 151 mg/dL — AB (ref 65–99)
GLUCOSE-CAPILLARY: 139 mg/dL — AB (ref 65–99)
Glucose-Capillary: 183 mg/dL — ABNORMAL HIGH (ref 65–99)

## 2016-12-09 LAB — BASIC METABOLIC PANEL
ANION GAP: 13 (ref 5–15)
BUN: 158 mg/dL — AB (ref 6–20)
CO2: 24 mmol/L (ref 22–32)
Calcium: 8.2 mg/dL — ABNORMAL LOW (ref 8.9–10.3)
Chloride: 95 mmol/L — ABNORMAL LOW (ref 101–111)
Creatinine, Ser: 3.6 mg/dL — ABNORMAL HIGH (ref 0.44–1.00)
GFR calc Af Amer: 14 mL/min — ABNORMAL LOW (ref >60)
GFR, EST NON AFRICAN AMERICAN: 12 mL/min — AB (ref >60)
Glucose, Bld: 125 mg/dL — ABNORMAL HIGH (ref 65–99)
POTASSIUM: 4 mmol/L (ref 3.5–5.1)
SODIUM: 132 mmol/L — AB (ref 135–145)

## 2016-12-09 LAB — CBC
HCT: 28.3 % — ABNORMAL LOW (ref 36.0–46.0)
Hemoglobin: 8.8 g/dL — ABNORMAL LOW (ref 12.0–15.0)
MCH: 26.1 pg (ref 26.0–34.0)
MCHC: 31.1 g/dL (ref 30.0–36.0)
MCV: 84 fL (ref 78.0–100.0)
PLATELETS: 99 10*3/uL — AB (ref 150–400)
RBC: 3.37 MIL/uL — AB (ref 3.87–5.11)
RDW: 16.6 % — ABNORMAL HIGH (ref 11.5–15.5)
WBC: 6.8 10*3/uL (ref 4.0–10.5)

## 2016-12-09 MED ORDER — ZOLPIDEM TARTRATE 5 MG PO TABS
5.0000 mg | ORAL_TABLET | Freq: Every evening | ORAL | Status: DC | PRN
  Administered 2016-12-10 – 2016-12-11 (×2): 5 mg via ORAL
  Filled 2016-12-09 (×3): qty 1

## 2016-12-09 MED ORDER — SALINE SPRAY 0.65 % NA SOLN
1.0000 | NASAL | Status: DC | PRN
  Filled 2016-12-09: qty 44

## 2016-12-09 MED ORDER — SODIUM CHLORIDE 0.9 % IV SOLN
510.0000 mg | INTRAVENOUS | Status: DC
  Administered 2016-12-09: 510 mg via INTRAVENOUS
  Filled 2016-12-09 (×2): qty 17

## 2016-12-09 MED ORDER — HEPARIN SODIUM (PORCINE) 5000 UNIT/ML IJ SOLN: 5000.0000 [IU] | Freq: Three times a day (TID) | INTRAMUSCULAR | Status: DC

## 2016-12-09 NOTE — Progress Notes (Signed)
Report from 1st shift RN Dedra Skeens that pt. having a nosebleed off on today and that heparin injection sites bleeding some; K. Schorr, NP paged and informed at 2010; RTC 2252- informed her no nose bleeding for me and scant amt. bleeding noted from (L) lower abdomen and mepelix placed over site; (R) and (L) lower abdomen bruised. Schorr, NP will let attending know. Informed her no bleeding noted with Heparin injection this pm.

## 2016-12-09 NOTE — Progress Notes (Signed)
O2 sats reading low and probe on finger checked and readjusted several times; finger probe changed and readings are better.

## 2016-12-09 NOTE — Progress Notes (Signed)
Patient Name: Cassandra Fisher Date of Encounter: 12/09/2016  [facility]  Hospital Problem List     Principal Problem:   Acute renal failure superimposed on stage 3 chronic kidney disease (HCC) Active Problems:   Diabetes mellitus with renal complications (HCC)   HLD (hyperlipidemia)   Anxiety state   Essential hypertension   Acute on chronic systolic heart failure (HCC)   COPD with exacerbation (HCC)   Hyperkalemia   Gout   Cardiomyopathy (HCC)   CKD (chronic kidney disease) stage 3, GFR 30-59 ml/min   Elevated troponin     Subjective   No chest pain, shortness of breath or palpitations.  Inpatient Medications    Scheduled Meds: . aspirin EC  81 mg Oral Daily  . darbepoetin (ARANESP) injection - NON-DIALYSIS  150 mcg Subcutaneous Q Tue-1800  . ferumoxytol  510 mg Intravenous Weekly  . furosemide  120 mg Intravenous Q6H  . heparin  5,000 Units Subcutaneous Q8H  . insulin aspart  0-5 Units Subcutaneous QHS  . insulin aspart  0-9 Units Subcutaneous TID WC  . insulin glargine  40 Units Subcutaneous Daily  . mouth rinse  15 mL Mouth Rinse BID  . sodium chloride flush  3 mL Intravenous Q12H   Continuous Infusions:  PRN Meds: sodium chloride, acetaminophen, albuterol, nitroGLYCERIN, ondansetron (ZOFRAN) IV, sodium chloride flush   Vital Signs    Vitals:   12/08/16 2300 12/09/16 0026 12/09/16 0400 12/09/16 0741  BP: (!) 84/36 (!) 102/31 (!) 106/50 (!) 114/45  Pulse: 75  74 73  Resp: 20  20 18   Temp: 97.9 F (36.6 C)  98.3 F (36.8 C) 98.4 F (36.9 C)  TempSrc:    Oral  SpO2: 95%  95% 97%  Weight:   263 lb 14.4 oz (119.7 kg)   Height:        Intake/Output Summary (Last 24 hours) at 12/09/16 0951 Last data filed at 12/09/16 0500  Gross per 24 hour  Intake              688 ml  Output              525 ml  Net              163 ml   Filed Weights   12/07/16 0500 12/08/16 0400 12/09/16 0400  Weight: 273 lb 5.9 oz (124 kg) 272 lb 12.8  oz (123.7 kg) 263 lb 14.4 oz (119.7 kg)    Physical Exam    GEN: Obese caucasian female, appears chronically ill, in no acute distress.  HEENT: Grossly normal.  Neck: Supple, no JVD, carotid bruits, or masses. Cardiac: RRR, 2/6 systolic murmur best appreciated at the right upper sternal border. 1+ pitting edema of the lower legs  Radials/DP/PT 2+ and equal bilaterally.  Respiratory:  Respirations regular and unlabored, Few crackles in bases GI: Soft, nontender, obese, BS + x 4. MS: no deformity or atrophy. Skin: warm and dry, no rash. Neuro:  Strength and sensation are intact. Psych: AAOx3.  Normal affect.  Labs    CBC  Recent Labs  12/08/16 0320 12/09/16 0413  WBC 7.3 6.8  HGB 9.5* 8.8*  HCT 31.4* 28.3*  MCV 86.3 84.0  PLT 111* 99*   Basic Metabolic Panel  Recent Labs  12/07/16 0159 12/08/16 0320 12/09/16 0413  NA 134* 132* 132*  K 4.5 4.2 4.0  CL 98* 96* 95*  CO2 24 25 24   GLUCOSE 207* 121* 125*  BUN  153* 158* 158*  CREATININE 2.77* 3.34* 3.60*  CALCIUM 8.6* 8.3* 8.2*  PHOS 8.6* 8.3*  --    Liver Function Tests  Recent Labs  12/07/16 0159 12/08/16 0320  ALBUMIN 2.4* 2.5*   No results for input(s): LIPASE, AMYLASE in the last 72 hours. Cardiac Enzymes No results for input(s): CKTOTAL, CKMB, CKMBINDEX, TROPONINI in the last 72 hours. BNP Invalid input(s): POCBNP D-Dimer No results for input(s): DDIMER in the last 72 hours. Hemoglobin A1C No results for input(s): HGBA1C in the last 72 hours. Fasting Lipid Panel No results for input(s): CHOL, HDL, LDLCALC, TRIG, CHOLHDL, LDLDIRECT in the last 72 hours. Thyroid Function Tests No results for input(s): TSH, T4TOTAL, T3FREE, THYROIDAB in the last 72 hours.  Invalid input(s): FREET3  Telemetry    Sinus rhythm with occ PACs in the 70's. Had a short burst of afib with RVR - Personally Reviewed  ECG    No new results  Radiology    No results found.  Cardiac Studies   12/06/2016- Echo Study  Conclusions  - Left ventricle: The cavity size was normal. Wall thickness was   normal. The estimated ejection fraction was 40%. There is severe   hypokinesis of the midanteroseptal myocardium. - Aortic valve: Trileaflet; moderately calcified leaflets. There   was mild to moderate stenosis. Mean gradient (S): 9 mm Hg. Peak   gradient (S): 14 mm Hg. VTI ratio of LVOT to aortic valve: 0.47.   Valve area (VTI): 1.64 cm^2. Valve area (Vmax): 1.58 cm^2. Valve   area (Vmean): 1.5 cm^2. - Mitral valve: Calcified annulus. There was mild regurgitation. - Left atrium: The atrium was mildly dilated. - Right ventricle: The cavity size was mildly dilated. Systolic   function was moderately reduced. - Tricuspid valve: There was trivial regurgitation. - Pulmonary arteries: PA peak pressure: 33 mm Hg (S). - Pericardium, extracardiac: There was no pericardial effusion.  Impressions:  - Normal LV wall thickness with LVEF approximately 40%, hypokinesis   of the mid anteroseptal wall noted. Grossly normal diastolic   function. Mild left atrial enlargement. Moderate posterior MAC   with mild mitral regurgitation. Mild to moderate aortic stenosis.   Moderately reduced right ventricular contraction. Trivial   tricuspid regurgitation with PASP 33 mmHg.   Patient Profile     65 yo female with history of CABG in 2003 and paroxysmal atrial fibrillation, COPD, DM, CKD, HTN, HLD who presented to the [facility] ED on 12/11/2016 for weakness. She was found to be in acute renal failure with creatinine of 3.12 and serum potassium of 5.9. Troponins were mildly elevated at 0.04. She is being seen by nephrology and may require dialysis.  Assessment & Plan    1. Acute renal failure -Creatinine 3.12-->2.96-->3.06-->2.77-->3.34-->3.60 (today, 12/26) -Followed by nephrology for suspected ATN secondary to low blood pressure on Vasotec in the setting of diabetic nephropathy.  -Receiving IV lasix -No ACE-I or  ARB -Aspirin reduced from 325 mg to 81 mg -Patient is refusing dialysis at this time  2. Elevated troponin -At flat pattern, 0.03, 0.04, 0.04, 0.04 -Likely related to renal failure  3. LV dysfunction -EF 40% with severe anteroseptal hypokinesis, was 45% in 2011 -BNP on 991 11/20/2016 -Pulmonary edema on CXR 12/22 -On IV lasix -Now breathing better  4. CAD -Hx CABG in 2003, LHC (10/2010): 6 bypass grafts patent,Severe disease in the proximal portion of the previously stented circumflex vessel that is unchanged from films in 2008 but is unprotected by a vein graft. Medical therapy  was recommended. -Mildly elevated tropoinin -No chest pain -No current need for further ischemic workup -continue aspirin -No current beta blocker, pt is hypotensive -consider adding statin  5. Hyperkalemia -resolved  6. Anemia -Managed by IM  7. DM -managed by IM  8. COPD -No current wheezing -Managed by IM, prn nebs  9. Paroxysmal atrial fibrillation -Currently sinus rhythm with a short burst of atrial fib with RVR -No anticoagulants at present due to anemia -Pt with low BP's, today (12/26) 102/53, limiting use of beta blocker at present.  As above, patient seen and examined. She denies dyspnea or chest pain but is volume overloaded on examination. She is considering dialysis. There are no active cardiac issues. We will sign off. Please call with questions.  Olga Millers, MD         Signed, Berton Bon, NP  12/09/2016, 9:51 AM

## 2016-12-09 NOTE — Plan of Care (Signed)
Problem: Education: Goal: Knowledge of [facility] Education information/materials will improve Outcome: Progressing POC reviewed with pt.   

## 2016-12-09 NOTE — Progress Notes (Signed)
PROGRESS NOTE                                                                                                                                                                                                             Patient Demographics:    Cassandra Fisher, is a 65 y.o. female, DOB - 03-07-1951, AUQ:[id number]  Admit date - 2017/01/03   Admitting Physician Haydee Salter, MD  Outpatient Primary MD for the patient is [name], MD  LOS - 3   Chief Complaint  Patient presents with  . Cough    pt has had a cough for the past three weeks increasing cough for the past 3 days        Brief Narrative   65 y.o. female with a Past Medical History negative for chronic kidney disease, diabetes, hypertension and CHF who presents with weakness, workup significant for acute renal failure, as well hyperkalemia, Significant volume overload, potassium> 7.5, with new LBBB, patient Initially refused hemodialysis, hyperkalemia resolved with medical measures, patient continues to have significantly elevated BUN, and volume overload, unresponsive to large dose diuresis, palliative medicine consulted, and agreeable to start dialysis.   Subjective:    Blanch Media today has, No headache, No abdominal pain , she denies any chest pain, Still reports dyspnea   Assessment  & Plan :    Principal Problem:   Acute renal failure superimposed on stage 3 chronic kidney disease (HCC) Active Problems:   Diabetes mellitus with renal complications (HCC)   HLD (hyperlipidemia)   Anxiety state   Essential hypertension   COPD with exacerbation (HCC)   Hyperkalemia   Gout   Cardiomyopathy (HCC)   CKD (chronic kidney disease) stage 3, GFR 30-59 ml/min   Elevated troponin   Goals of care, counseling/discussion   Acute renal failure and CKD stage III - Baseline creatinine 1.5, with worsening creatinine in the setting of ATN secondary to low blood pressure on vasotec in the  setting of diabetic nephropathy. - renal input greatly appreciated , continue to hold nephrotoxic medication, continue with diuresis for volume overload, Not much response for large dose of IV Lasix 120 mg IV every 6 hours , urine output is only 525 mL over last 24 hours ,  - Patient currently agreeable to hemodialysis after discussion with palliative medicine, I requested to insert temporary  dialysis catheter.  Hyperkalemia - Secondary to renal failure, resolved with medical management, continue to monitor.  Left bundle branch block - Continue to hyperkalemia, new onset, continue to monitor on telemetry.  Acute Hypoxic respiratory failure - Secondary to volume overload, related to renal failure versus CHF - Continue with oxygen as needed, continue to diuresis aggressively - Agreeable to dialysis - Will have one BiPAP when necessary.  COPD - No active wheezing, continue with nebs as needed  Acute on chronic systolic heart failure - Most recent echo in 2011 EF 45%, repeat 2-D echo with EF 40%, severe hypokinesis of the mid anterior septal myocardium. - Continue with IV diuresis, continue to hold ACE inhibitor in the setting of renal failure, continue to hold beta blocker in the setting of soft blood pressure - Patient currently agreeable to dialysis, volume management will through hemodialysis  Elevated troponin - Likely demand ischemia in the setting of renal failure, hypoxia and respiratory distress, upon a trend is non-ACS pattern 0.03> 0.04> 0.04 - Her left bundle branch block related to hyperkalemia  Diabetes mellitus - Patient on glipizide and insulin NPH 82 units twice a day , Continue with insulin sliding scale, maintenance control, acceptable  on Lantus 40 units subcutaneous daily. - Follow on hemoglobin A1c  Code Status : DO NOT RESUSCITATE  Family Communication  : D/W patient , and husband  at bedside today.  Disposition Plan  : Pending further work up.  Consults  :   Renal, cardiology, palliative medicine  Procedures  : None  DVT Prophylaxis  : Heparin - SCD  Lab Results  Component Value Date   PLT 99 (L) 12/09/2016    Antibiotics  :   Anti-infectives    None        Objective:   Vitals:   12/09/16 0741 12/09/16 0934 12/09/16 1134 12/09/16 1202  BP: (!) 114/45 (!) 102/53 (!) 121/51   Pulse: 73 73 77 71  Resp: 18 20 20 18   Temp: 98.4 F (36.9 C)   98.6 F (37 C)  TempSrc: Oral   Oral  SpO2: 97%  90% 96%  Weight:      Height:        Wt Readings from Last 3 Encounters:  12/09/16 119.7 kg (263 lb 14.4 oz)  07/17/16 109.8 kg (242 lb)  06/03/16 112.3 kg (247 lb 8 oz)     Intake/Output Summary (Last 24 hours) at 12/09/16 1513 Last data filed at 12/09/16 1347  Gross per 24 hour  Intake              808 ml  Output              725 ml  Net               83 ml     Physical Exam  Awake Alert, Oriented X 3, Normal affect Supple Neck,No JVD  Symmetrical Chest wall movement, Diminished air movement bilaterally at bases, no wheezing. RRR,No Gallops,Rubs or new Murmurs, No Parasternal Heave +ve B.Sounds, Abd Soft, No tenderness,  No rebound - guarding or rigidity. No Cyanosis,  No new Rash or bruise, anasarca   Data Review:    CBC  Recent Labs Lab 12/14/2016 1306 12/06/16 0303 12/07/16 0159 12/08/16 0320 12/09/16 0413  WBC 8.8 7.8 5.7 7.3 6.8  HGB 10.6* 10.7* 9.3* 9.5* 8.8*  HCT 34.5* 35.9* 31.0* 31.4* 28.3*  PLT 132* 122* 112* 111* 99*  MCV 85.8 88.9 86.6 86.3 84.0  MCH 26.4 26.5 26.0 26.1 26.1  MCHC 30.7 29.8* 30.0 30.3 31.1  RDW 16.6* 17.1* 16.4* 16.4* 16.6*  LYMPHSABS 0.8  --   --   --   --   MONOABS 0.4  --   --   --   --   EOSABS 0.1  --   --   --   --   BASOSABS 0.0  --   --   --   --     Chemistries   Recent Labs Lab 12/06/2016 1306 12/12/2016 1645 11/20/2016 2058 12/06/16 0303  12/06/16 1450 12/06/16 2015 12/07/16 0159 12/08/16 0320 12/09/16 0413  NA 130*  --   --  129*  --   --   --  134* 132* 132*   K 5.9*  --  6.0* >7.5*  < > 5.4* 4.6 4.5 4.2 4.0  CL 97*  --   --  94*  --   --   --  98* 96* 95*  CO2 22  --   --  24  --   --   --  24 25 24   GLUCOSE 142*  --   --  273*  --   --   --  207* 121* 125*  BUN 139*  --   --  142*  --   --   --  153* 158* 158*  CREATININE 3.12* 2.96*  --  3.06*  --   --   --  2.77* 3.34* 3.60*  CALCIUM 8.3*  --   --  8.4*  --   --   --  8.6* 8.3* 8.2*  MG  --   --  2.9*  --   --   --   --   --   --   --   AST 22  --   --   --   --   --   --   --   --   --   ALT 19  --   --   --   --   --   --   --   --   --   ALKPHOS 177*  --   --   --   --   --   --   --   --   --   BILITOT 0.5  --   --   --   --   --   --   --   --   --   < > = values in this interval not displayed. ------------------------------------------------------------------------------------------------------------------ No results for input(s): CHOL, HDL, LDLCALC, TRIG, CHOLHDL, LDLDIRECT in the last 72 hours.  Lab Results  Component Value Date   HGBA1C 7.1 (H) 12/14/2016   ------------------------------------------------------------------------------------------------------------------ No results for input(s): TSH, T4TOTAL, T3FREE, THYROIDAB in the last 72 hours.  Invalid input(s): FREET3 ------------------------------------------------------------------------------------------------------------------  Recent Labs  12/08/16 1327  TIBC 311  IRON 14*    Coagulation profile No results for input(s): INR, PROTIME in the last 168 hours.  No results for input(s): DDIMER in the last 72 hours.  Cardiac Enzymes  Recent Labs Lab 11/19/2016 1645 12/08/2016 2058 12/06/16 0303  TROPONINI 0.04* 0.04* 0.04*   ------------------------------------------------------------------------------------------------------------------    Component Value Date/Time   BNP 991.2 (H) 11/23/2016 1306    Inpatient Medications  Scheduled Meds: . aspirin EC  81 mg Oral Daily  . darbepoetin (ARANESP)  injection - NON-DIALYSIS  150 mcg Subcutaneous Q Tue-1800  . ferumoxytol  510 mg Intravenous Weekly  .  furosemide  120 mg Intravenous Q6H  . [START ON 12/10/2016] heparin  5,000 Units Subcutaneous Q8H  . insulin aspart  0-5 Units Subcutaneous QHS  . insulin aspart  0-9 Units Subcutaneous TID WC  . insulin glargine  40 Units Subcutaneous Daily  . mouth rinse  15 mL Mouth Rinse BID  . sodium chloride flush  3 mL Intravenous Q12H   Continuous Infusions: PRN Meds:.sodium chloride, acetaminophen, albuterol, nitroGLYCERIN, ondansetron (ZOFRAN) IV, sodium chloride, sodium chloride flush  Micro Results Recent Results (from the past 240 hour(s))  MRSA PCR Screening     Status: None   Collection Time: 12/06/16 11:33 AM  Result Value Ref Range Status   MRSA by PCR NEGATIVE NEGATIVE Final    Comment:        The GeneXpert MRSA Assay (FDA approved for NASAL specimens only), is one component of a comprehensive MRSA colonization surveillance program. It is not intended to diagnose MRSA infection nor to guide or monitor treatment for MRSA infections.     Radiology Reports Dg Chest 2 View  Result Date: 12/19/16 CLINICAL DATA:  Generalize weakness for several days. History of CHF, coronary artery disease and previous MI and, COPD, former smoker. EXAM: CHEST  2 VIEW COMPARISON:  None in PACs FINDINGS: There is a moderate-sized right pleural effusion. There is no pleural effusion on the left. The left lung is well-expanded. Both lungs exhibit increased interstitial density. The cardiac silhouette is enlarged. The pulmonary vascularity is engorged. The patient has undergone previous median sternotomy and CABG. There is calcification in the wall of the aortic arch. The observed bony thorax is unremarkable. IMPRESSION: COPD. CHF with pulmonary interstitial edema. Moderate-sized right pleural effusion. There may be right basilar atelectasis or pneumonia. Thoracic aortic atherosclerosis. Electronically  Signed   By: David  [place] M.D.   On: December 19, 2016 14:10   US Renal  Result Date: 12/19/2016 CLINICAL DATA:  Acute on chronic kidney injury. EXAM: RENAL / URINARY TRACT ULTRASOUND COMPLETE COMPARISON:  None. FINDINGS: Right Kidney: Length: 11.1 cm. Mildly increased echogenicity and moderate parenchymal thinning, consistent with medical renal disease. No hydronephrosis. No suspicious focal parenchymal lesion. Left Kidney: Length: 11.0 cm. Mildly increased echogenicity and moderate parenchymal thinning, consistent with medical renal disease. No hydronephrosis. No suspicious focal parenchymal lesion. Bladder: Appears normal for degree of bladder distention. IMPRESSION: Atrophic appearing kidneys with increased echogenicity consistent with medical renal disease. No hydronephrosis. Electronically Signed   By: Ellery Plunk M.D.   On: Dec 19, 2016 23:17      Merle Cirelli M.D on 12/09/2016 at 3:13 PM  Between 7am to 7pm - Pager - [phone]  After 7pm go to [email or web address] - password [facility]  [facility] -  Office  [phone]

## 2016-12-09 NOTE — Progress Notes (Signed)
S: Denies overt uremic sxs.  Denies SOB O:BP (!) 106/50   Pulse 74   Temp 98.3 F (36.8 C)   Resp 20   Ht 5\' 7"  (1.702 m)   Wt 119.7 kg (263 lb 14.4 oz)   SpO2 95%   BMI 41.33 kg/m   Intake/Output Summary (Last 24 hours) at 12/09/16 0735 Last data filed at 12/09/16 0500  Gross per 24 hour  Intake              808 ml  Output              525 ml  Net              283 ml   Weight change: -4.037 kg (-8 lb 14.4 oz) WER:XVQMG and alert CVS: RRR Resp: Basilar crackles, scattered wheeze Abd: + BS NT ND Ext: 1-2+ edema NEURO:CNI Ox3  + asterixis   . aspirin EC  81 mg Oral Daily  . darbepoetin (ARANESP) injection - NON-DIALYSIS  150 mcg Subcutaneous Q Tue-1800  . furosemide  120 mg Intravenous Q6H  . heparin  5,000 Units Subcutaneous Q8H  . insulin aspart  0-5 Units Subcutaneous QHS  . insulin aspart  0-9 Units Subcutaneous TID WC  . insulin glargine  40 Units Subcutaneous Daily  . mouth rinse  15 mL Mouth Rinse BID  . sodium chloride flush  3 mL Intravenous Q12H   No results found. BMET    Component Value Date/Time   NA 132 (L) 12/09/2016 0413   K 4.0 12/09/2016 0413   CL 95 (L) 12/09/2016 0413   CO2 24 12/09/2016 0413   GLUCOSE 125 (H) 12/09/2016 0413   BUN 158 (H) 12/09/2016 0413   CREATININE 3.60 (H) 12/09/2016 0413   CALCIUM 8.2 (L) 12/09/2016 0413   GFRNONAA 12 (L) 12/09/2016 0413   GFRAA 14 (L) 12/09/2016 0413   CBC    Component Value Date/Time   WBC 6.8 12/09/2016 0413   RBC 3.37 (L) 12/09/2016 0413   HGB 8.8 (L) 12/09/2016 0413   HCT 28.3 (L) 12/09/2016 0413   PLT 99 (L) 12/09/2016 0413   MCV 84.0 12/09/2016 0413   MCH 26.1 12/09/2016 0413   MCHC 31.1 12/09/2016 0413   RDW 16.6 (H) 12/09/2016 0413   LYMPHSABS 0.8 11/21/2016 1306   MONOABS 0.4 11/27/2016 1306   EOSABS 0.1 11/27/2016 1306   BASOSABS 0.0 11/27/2016 1306     Assessment: 1. Acute on CKD 3, UO not fully recorded.  Scr sl higher 2. Anemia, Fe def 3. DM 4. Cardiomyopathy 5. Mild  thrombocytopenia  Plan: 1. Replace iron 2.  Again discussed the role of HD and that there is a 50/50 chance this could be temporary but she is still refusing (but will think about it) 3. Cont diuretics for now 4. Daily labs 5. I would stop the SQ heparin   Larisa Lanius T

## 2016-12-09 NOTE — Consult Note (Signed)
Consultation Note Date: 12/09/2016   Patient Name: Cassandra Fisher  DOB: 1951/06/28  MRN: [id number]  Age / Sex: 65 y.o., female  PCP: Lucille Passy, MD Referring Physician: Albertine Patricia, MD  Reason for Consultation: Disposition, Establishing goals of care and Psychosocial/spiritual support  HPI/Patient Profile: 65 y.o. female  with past medical history of CAD, CHF with CABG in 2003, HTN, A. FIB, COPD, and type 2 diabetes who was admitted on 11/19/2016 with weakness. Workup showed acute renal failure, hyperkalemia, new LBBB, and significant volume overload with CXR showing CHF with pulmonary interstitial edema, moderate right pleural effusion, and possible right basilar atelectasis vs PNA. Nephrology was consulted and felt ATN secondary to low blood pressure from Vasotec in the setting of diabetic nephropathy. Both Nephrology and Cardiology have advised dialysis (likely short term) for volume management, however pt continues to refuse, presently managing with diuresis.  Clinical Assessment and Goals of Care: I met with Cassandra Fisher at her bedside. Her husband, daughter, and multiple granddaughters were present. I introduced myself and explained the role of palliative care, specifically explaining that she has serious health issues, and my role was to support her and her family in clarifying her goals of care and making hard decisions to best align with those goals.   Cassandra Fisher was very withdrawn during our conversation, and her family did the majority of the talking. They relayed that she was previously living at home and doing quite well. Her husband provided supportive care, and they found a good system to support each other to enable living independently. Her progressive weakness, beyond the point that her husband could manage, was what prompted presentation to the hospital. Her acute renal failure with associated electrolyte abnormalities was a big shock  to them. She has multiple chronic health issues, but they didn't expect the rapid decline and decisions that followed. Cassandra Fisher husband and daughter both have strong reservations about dialysis, including worsening quality of life in an already frail woman. They are, however very supportive of whatever decision she makes.   In talking with Cassandra Fisher, she expressed surprise and sadness over the kidney issues. She understands her options very clearly: decline dialysis and understand she will likely have worsening kidney failure, volume overload issues, and she will die in days to weeks, versus start dialysis with the potential that it may be lifelong, but even in the short term it may make her very tired and will likely diminish the quality of her life. In discussing these things, she verbalized a clear desire to live longer, even at the cost of potentially reducing the quality of her life. She does not feel ready to accept death, and wants more time with her family. I did explain that she could always decide to stop dialysis after starting if she found the burden greater than the benefit, which she acknowledged.   Finally, I also addressed code status with her. I asked that, in the event that her heart stopped or her lungs stopped and she should die a natural death, would she want [place] to try to bring her back with chest compressions, heart shocks, and possibly putting her on a breathing machine. She very clearly stated she would NOT want those things, and would want to be made comfortable and to die in peace. I explained that we call that a Do Not Resuscitate order in the hospital, which she understood. Her daughter was present during this conversation, and reiterated that she knew her mother would  not want those interventions.   Primary Decision Maker PATIENT   SUMMARY OF RECOMMENDATIONS    Pt would like to start dialysis  DNR  Palliative will follow along to support her and her family in this  difficult time  Code Status/Advance Care Planning:  DNR  Symptom Management:   Pt's main symptom is feeling short of breath and very weak. We discussed the fluid overload issues as the likely culprit for her SOB, and I provided strategies to help manage, which included: frequent breaks between activities, utilization of O2, shortened activity time, and smaller meals eaten with breaks. I suspect this will improve with dialysis and subsequent fluid management  Pt also having nosebleeds. I discussed with Primary provider and have held SQ heparin for 24 hours, as well as written for a saline nose spray to help hydrate nares and manage congestion  Palliative Prophylaxis:   Bowel Regimen, Frequent Pain Assessment and Turn Reposition  Additional Recommendations (Limitations, Scope, Preferences):  Full Scope Treatment  Psycho-social/Spiritual:   Desire for further Chaplaincy support:no  Additional Recommendations: TBD  Prognosis:   Unable to determine  Discharge Planning: I would expect discharge home with [facility] services     Primary Diagnoses: Present on Admission: . Anxiety state . COPD with exacerbation ([place]) . CKD (chronic kidney disease) stage 3, GFR 30-59 ml/min . Diabetes mellitus with renal complications ([place]) . Essential hypertension . Gout . HLD (hyperlipidemia) . Hyperkalemia . Elevated troponin . Acute renal failure superimposed on stage 3 chronic kidney disease ([place]) . Acute on chronic systolic heart failure ([place])   I have reviewed the medical record, interviewed the patient and family, and examined the patient. The following aspects are pertinent.  Past Medical History:  Diagnosis Date  . Anxiety   . Arthritis   . Atrial fibrillation ([place])   . CAD (coronary artery disease)   . CHF (congestive heart failure) ([place])   . COPD (chronic obstructive pulmonary disease) ([place])   . DM (diabetes mellitus) ([place])   . Gout   . HTN (hypertension)   . Hx of CABG   .  Hyperlipidemia   . Kidney stones    x 3  . MI (myocardial infarction)   . Obesity   . Vertigo    Social History   Social History  . Marital status: Married    Spouse name: N/A  . Number of children: 4  . Years of education: N/A   Social History Main Topics  . Smoking status: Former Smoker    Types: Cigarettes    Quit date: 09/01/1991  . Smokeless tobacco: Never Used     Comment: has a 30 pack year history, quit in late 1990s  . Alcohol use No  . Drug use: No  . Sexual activity: No   Other Topics Concern  . None   Social History Narrative  . None   Family History  Problem Relation Age of Onset  . Hip fracture Mother     died at 77 following hip fx  . Diabetes Mother     borderline  . Kidney disease Father     Bright's disease  . Diabetes Sister   . Lung cancer Maternal Uncle     x 2  . Breast cancer Maternal Aunt   . Crohn's disease Daughter   . Diabetes Maternal Grandmother   . Colon cancer Neg Hx    Scheduled Meds: . aspirin EC  81 mg Oral Daily  . darbepoetin (ARANESP) injection - NON-DIALYSIS  150 mcg Subcutaneous Q Tue-1800  . ferumoxytol  510 mg Intravenous Weekly  . furosemide  120 mg Intravenous Q6H  . heparin  5,000 Units Subcutaneous Q8H  . insulin aspart  0-5 Units Subcutaneous QHS  . insulin aspart  0-9 Units Subcutaneous TID WC  . insulin glargine  40 Units Subcutaneous Daily  . mouth rinse  15 mL Mouth Rinse BID  . sodium chloride flush  3 mL Intravenous Q12H   Continuous Infusions: PRN Meds:.sodium chloride, acetaminophen, albuterol, nitroGLYCERIN, ondansetron (ZOFRAN) IV, sodium chloride flush Allergies  Allergen Reactions  . Colcrys [Colchicine] Other (See Comments)    Dizzy and fill like pass out  . Metformin And Related Swelling  . Pravastatin Swelling  . Sulfa Antibiotics Swelling  . Zocor [Simvastatin] Hives and Swelling   Review of Systems  Constitutional: Positive for activity change, appetite change and fatigue.  HENT:  Positive for congestion and nosebleeds. Negative for hearing loss, sinus pressure, sore throat and trouble swallowing.   Eyes: Negative for visual disturbance.  Respiratory: Positive for shortness of breath. Negative for cough, chest tightness and wheezing.   Cardiovascular: Positive for leg swelling. Negative for chest pain.  Gastrointestinal: Positive for abdominal distention. Negative for abdominal pain, constipation, nausea and vomiting.  Genitourinary: Negative for flank pain.       Foley  Musculoskeletal: Positive for gait problem and myalgias. Negative for back pain.  Skin: Positive for pallor.  Neurological: Positive for weakness. Negative for dizziness, speech difficulty, light-headedness and headaches.  Hematological: Bruises/bleeds easily.  Psychiatric/Behavioral: Negative for confusion, decreased concentration and sleep disturbance. The patient is nervous/anxious.    Physical Exam  Constitutional: She is oriented to person, place, and time. No distress.  HENT:  Head: Normocephalic and atraumatic.  Mouth/Throat: Oropharynx is clear and moist.  Dried blood in both nares  Eyes: EOM are normal.  Neck: Normal range of motion. Neck supple.  Cardiovascular:  Intermittent tachycardia  Pulmonary/Chest: She has decreased breath sounds in the right lower field and the left lower field. She has no wheezes. She has rhonchi (throughout). She has no rales.  Increased WOB with any movement or prolonged conversation  Abdominal: Soft. She exhibits distension.  Musculoskeletal: She exhibits edema.  Stiffness in BLE. Pain with passive ROM.  Neurological: She is alert and oriented to person, place, and time.  Skin: Skin is warm and dry. There is pallor.  Psychiatric: She has a normal mood and affect. Judgment and thought content normal. Her speech is delayed. She is slowed and withdrawn. Cognition and memory are normal.    Vital Signs: BP (!) 114/45 (BP Location: Right Arm)   Pulse 73    Temp 98.4 F (36.9 C) (Oral)   Resp 18   Ht 5' 7"  (1.702 m)   Wt 119.7 kg (263 lb 14.4 oz)   SpO2 97%   BMI 41.33 kg/m  Pain Assessment: 0-10   Pain Score: Asleep   SpO2: SpO2: 97 % O2 Device:SpO2: 97 % O2 Flow Rate: .O2 Flow Rate (L/min): 2 L/min  IO: Intake/output summary:  Intake/Output Summary (Last 24 hours) at 12/09/16 0851 Last data filed at 12/09/16 0500  Gross per 24 hour  Intake              808 ml  Output              525 ml  Net              283 ml    LBM: Last  BM Date: 12/07/16 Baseline Weight: Weight: 113.4 kg (250 lb) Most recent weight: Weight: 119.7 kg (263 lb 14.4 oz)     Palliative Assessment/Data:  PPS 50-60% at baseline, presently 40-50%    Time In: 1315 Time Out: 1415 Time Total: 60 minutes Greater than 50%  of this time was spent counseling and coordinating care related to the above assessment and plan.  Signed by: Charlynn Court, NP Palliative Medicine Team Pager # [phone] (M-F 7a-5p) Team Phone # [phone] (Nights/Weekends)

## 2016-12-09 NOTE — Care Management Note (Signed)
Case Management Note Donn Pierini RN, BSN Unit 2W-Case Manager [phone] Covering 3W  Patient Details  Name: Cassandra Fisher MRN: [id number] Date of Birth: 11-04-1951  Subjective/Objective:   Pt admitted with Acute renal failure- refusing HD at this time.                  Action/Plan: PTA pt lived at home with husband, MD ordered [facility] consult has pt is refusing HD at this time- needs GOC. CM to follow..    Expected Discharge Date:                  Expected Discharge Plan:  Home/Self Care  In-House Referral:     Discharge planning Services  CM Consult  Post Acute Care Choice:    Choice offered to:     DME Arranged:    DME Agency:     HH Arranged:    HH Agency:     Status of Service:  In process, will continue to follow  If discussed at Long Length of Stay Meetings, dates discussed:    Additional Comments:  Darrold Span, RN 12/09/2016, 11:32 AM

## 2016-12-09 NOTE — Progress Notes (Signed)
Sm. nose bleed (L) nostril; pt. states it's mainly the (L) nostril.

## 2016-12-10 ENCOUNTER — Inpatient Hospital Stay (HOSPITAL_COMMUNITY): Payer: PPO

## 2016-12-10 ENCOUNTER — Encounter (HOSPITAL_COMMUNITY): Payer: Self-pay | Admitting: Interventional Radiology

## 2016-12-10 DIAGNOSIS — N189 Chronic kidney disease, unspecified: Secondary | ICD-10-CM

## 2016-12-10 DIAGNOSIS — Z515 Encounter for palliative care: Secondary | ICD-10-CM

## 2016-12-10 HISTORY — PX: IR GENERIC HISTORICAL: IMG1180011

## 2016-12-10 LAB — RENAL FUNCTION PANEL
ALBUMIN: 2.4 g/dL — AB (ref 3.5–5.0)
Anion gap: 11 (ref 5–15)
BUN: 169 mg/dL — AB (ref 6–20)
CO2: 26 mmol/L (ref 22–32)
Calcium: 8.2 mg/dL — ABNORMAL LOW (ref 8.9–10.3)
Chloride: 95 mmol/L — ABNORMAL LOW (ref 101–111)
Creatinine, Ser: 3.16 mg/dL — ABNORMAL HIGH (ref 0.44–1.00)
GFR calc Af Amer: 17 mL/min — ABNORMAL LOW (ref >60)
GFR, EST NON AFRICAN AMERICAN: 14 mL/min — AB (ref >60)
Glucose, Bld: 110 mg/dL — ABNORMAL HIGH (ref 65–99)
PHOSPHORUS: 8.5 mg/dL — AB (ref 2.5–4.6)
Potassium: 3.7 mmol/L (ref 3.5–5.1)
Sodium: 132 mmol/L — ABNORMAL LOW (ref 135–145)

## 2016-12-10 LAB — CBC
HCT: 29.7 % — ABNORMAL LOW (ref 36.0–46.0)
HEMOGLOBIN: 9.2 g/dL — AB (ref 12.0–15.0)
MCH: 26 pg (ref 26.0–34.0)
MCHC: 31 g/dL (ref 30.0–36.0)
MCV: 83.9 fL (ref 78.0–100.0)
Platelets: 106 10*3/uL — ABNORMAL LOW (ref 150–400)
RBC: 3.54 MIL/uL — AB (ref 3.87–5.11)
RDW: 16.4 % — ABNORMAL HIGH (ref 11.5–15.5)
WBC: 6.7 10*3/uL (ref 4.0–10.5)

## 2016-12-10 LAB — GLUCOSE, CAPILLARY
GLUCOSE-CAPILLARY: 107 mg/dL — AB (ref 65–99)
Glucose-Capillary: 116 mg/dL — ABNORMAL HIGH (ref 65–99)
Glucose-Capillary: 103 mg/dL — ABNORMAL HIGH (ref 65–99)
Glucose-Capillary: 84 mg/dL (ref 65–99)

## 2016-12-10 LAB — HEPATITIS B SURFACE ANTIGEN: Hepatitis B Surface Ag: NEGATIVE

## 2016-12-10 MED ORDER — HEPARIN SODIUM (PORCINE) 1000 UNIT/ML IJ SOLN
INTRAMUSCULAR | Status: AC
  Filled 2016-12-10: qty 1

## 2016-12-10 MED ORDER — POLYETHYLENE GLYCOL 3350 17 G PO PACK
17.0000 g | PACK | Freq: Every day | ORAL | Status: DC
  Administered 2016-12-11: 17 g via ORAL
  Filled 2016-12-10: qty 1

## 2016-12-10 MED ORDER — HYDROMORPHONE HCL 1 MG/ML IJ SOLN
0.5000 mg | INTRAMUSCULAR | Status: DC | PRN
  Administered 2016-12-10 – 2016-12-11 (×3): 0.5 mg via INTRAVENOUS
  Filled 2016-12-10 (×3): qty 1

## 2016-12-10 MED ORDER — LIDOCAINE HCL (PF) 1 % IJ SOLN
INTRAMUSCULAR | Status: DC | PRN
  Administered 2016-12-10: 5 mL

## 2016-12-10 MED ORDER — LIDOCAINE HCL (PF) 2 % IJ SOLN
INTRAMUSCULAR | Status: AC
  Filled 2016-12-10: qty 10

## 2016-12-10 MED ORDER — FUROSEMIDE 10 MG/ML IJ SOLN
120.0000 mg | Freq: Three times a day (TID) | INTRAVENOUS | Status: DC
  Administered 2016-12-10 – 2016-12-11 (×3): 120 mg via INTRAVENOUS
  Filled 2016-12-10 (×4): qty 12

## 2016-12-10 MED ORDER — SENNA 8.6 MG PO TABS
1.0000 | ORAL_TABLET | Freq: Two times a day (BID) | ORAL | Status: DC
Start: 2016-12-10 — End: 2016-12-12
  Administered 2016-12-10 – 2016-12-11 (×3): 8.6 mg via ORAL
  Filled 2016-12-10 (×3): qty 1

## 2016-12-10 NOTE — Procedures (Signed)
Interventional Radiology Procedure Note  Procedure: Placement of a RIGHT IJ 20 cm [place] non tunneled HD catheter.  Catheter tip sin upper RA and ready for use.  Complications: None  Estimated Blood Loss: None  Recommendations: - Routine line care  Signed,  Sterling Big, MD

## 2016-12-10 NOTE — Progress Notes (Signed)
Dialysis treatment completed.  2500 mL ultrafiltrated.  2000 mL net fluid removal.  Patient status unchanged. Lung sounds diminished to ausculation in all fields. Generalized edema. Cardiac: NSR, HB.  Cleansed RIJ catheter with chlorhexidine.  Disconnected lines and flushed ports with saline per protocol.  Ports locked with heparin and capped per protocol.    Report given to bedside, RN Gwen.

## 2016-12-10 NOTE — Progress Notes (Signed)
S: Eating OK.  + weakness O:BP 111/62 (BP Location: Left Arm)   Pulse 69   Temp 97.7 F (36.5 C) (Oral)   Resp 18   Ht 5\' 7"  (1.702 m)   Wt 125.7 kg (277 lb 3.2 oz)   SpO2 95%   BMI 43.42 kg/m   Intake/Output Summary (Last 24 hours) at 12/10/16 0731 Last data filed at 12/10/16 0500  Gross per 24 hour  Intake              965 ml  Output              975 ml  Net              -10 ml   Weight change: 6.033 kg (13 lb 4.8 oz) IRS:WNIOE and alert CVS: RRR Resp: Basilar crackles Abd: + BS NT ND Ext: 2+ edema NEURO:CNI Ox3  + asterixis   . aspirin EC  81 mg Oral Daily  . darbepoetin (ARANESP) injection - NON-DIALYSIS  150 mcg Subcutaneous Q Tue-1800  . ferumoxytol  510 mg Intravenous Weekly  . furosemide  120 mg Intravenous Q6H  . heparin  5,000 Units Subcutaneous Q8H  . insulin aspart  0-5 Units Subcutaneous QHS  . insulin aspart  0-9 Units Subcutaneous TID WC  . insulin glargine  40 Units Subcutaneous Daily  . mouth rinse  15 mL Mouth Rinse BID  . sodium chloride flush  3 mL Intravenous Q12H   No results found. BMET    Component Value Date/Time   NA 132 (L) 12/10/2016 0542   K 3.7 12/10/2016 0542   CL 95 (L) 12/10/2016 0542   CO2 26 12/10/2016 0542   GLUCOSE 110 (H) 12/10/2016 0542   BUN 169 (H) 12/10/2016 0542   CREATININE 3.16 (H) 12/10/2016 0542   CALCIUM 8.2 (L) 12/10/2016 0542   GFRNONAA 14 (L) 12/10/2016 0542   GFRAA 17 (L) 12/10/2016 0542   CBC    Component Value Date/Time   WBC 6.8 12/09/2016 0413   RBC 3.37 (L) 12/09/2016 0413   HGB 8.8 (L) 12/09/2016 0413   HCT 28.3 (L) 12/09/2016 0413   PLT 99 (L) 12/09/2016 0413   MCV 84.0 12/09/2016 0413   MCH 26.1 12/09/2016 0413   MCHC 31.1 12/09/2016 0413   RDW 16.6 (H) 12/09/2016 0413   LYMPHSABS 0.8 15-Dec-2016 1306   MONOABS 0.4 12/15/16 1306   EOSABS 0.1 15-Dec-2016 1306   BASOSABS 0.0 12/15/16 1306     Assessment: 1. Acute on CKD 3, Scr improved but BUN higher with mild uremia 2. Anemia, Fe  def 3. DM 4. Cardiomyopathy  EF 40% 5. Mild thrombocytopenia  Plan: 1. IR to place temp cath today and then will do HD.  Will plan 2-3 tx then reassess.  Would still be optimistic she could come off HD at some point 2. Change lasix to q 8hr for now  Brentt Fread T

## 2016-12-10 NOTE — Progress Notes (Signed)
PROGRESS NOTE                                                                                                                                                                                                             Patient Demographics:    Cassandra Fisher, is a 65 y.o. female, DOB - 1951/03/01, JYN:[id number]  Admit date - 12/04/2016   Admitting Physician Haydee Salter, MD  Outpatient Primary MD for the patient is Cassandra Mannan, MD  LOS - 4   Chief Complaint  Patient presents with  . Cough    pt has had a cough for the past three weeks increasing cough for the past 3 days        Brief Narrative   65 y.o. female with a Past Medical History negative for chronic kidney disease, diabetes, hypertension and CHF who presents with weakness, workup significant for acute renal failure, as well hyperkalemia, Significant volume overload, potassium> 7.5, with new LBBB, patient Initially refused hemodialysis, hyperkalemia resolved with medical measures, patient continues to have significantly elevated BUN, and volume overload, unresponsive to large dose diuresis, palliative medicine consulted, and agreeable to start dialysis.   Subjective:    Blanch Media today has, No headache, No abdominal pain , she denies any chest pain, Still reports dyspnea   Assessment  & Plan :    Principal Problem:   Acute renal failure superimposed on chronic kidney disease (HCC) Active Problems:   Diabetes mellitus with renal complications (HCC)   HLD (hyperlipidemia)   Anxiety state   Essential hypertension   COPD with exacerbation (HCC)   Hyperkalemia   Gout   Cardiomyopathy (HCC)   CKD (chronic kidney disease) stage 3, GFR 30-59 ml/min   Elevated troponin   Goals of care, counseling/discussion   Palliative care by specialist   Acute renal failure and CKD stage III - Baseline creatinine 1.5, with worsening creatinine in the setting of ATN secondary to low blood pressure  on vasotec in the setting of diabetic nephropathy. - Management per renal , no significant improvement with volume status due to diuresis, with significantly elevated BUN , patient finally agreeing to hemodialysis, temporary dialysis catheter inserted 12/27, and started on hemodialysis.  Hyperkalemia - Secondary to renal failure, resolved with medical management, continue to monitor.  Left bundle branch block - due to hyperkalemia, new  onset, continue to monitor on telemetry.  Acute Hypoxic respiratory failure - Secondary to volume overload, related to renal failure versus CHF - Continue with oxygen as needed, continue with volume management with dialysis  COPD - No active wheezing, continue with nebs as needed  Acute on chronic systolic heart failure - Most recent echo in 2011 EF 45%, repeat 2-D echo with EF 40%, severe hypokinesis of the mid anterior septal myocardium. - Continue with IV diuresis, continue to hold ACE inhibitor in the setting of renal failure, continue to hold beta blocker in the setting of soft blood pressure - Continue with volume management during dialysis  Elevated troponin - Likely demand ischemia in the setting of renal failure, hypoxia and respiratory distress, upon a trend is non-ACS pattern 0.03> 0.04> 0.04 - Her left bundle branch block related to hyperkalemia  Diabetes mellitus - Patient on glipizide and insulin NPH 82 units twice a day , Continue with insulin sliding scale, maintenance control, acceptable  on Lantus 40 units subcutaneous daily. - Follow on hemoglobin A1c  Code Status : DO NOT RESUSCITATE  Family Communication  : D/W patient , sister and husband  at bedside today.  Disposition Plan  : Pending further work up.  Consults  :  Renal, cardiology, palliative medicine  Procedures  : Temporary dialysis catheter by IR 12/27  DVT Prophylaxis  : Heparin - SCD  Lab Results  Component Value Date   PLT 106 (L) 12/10/2016    Antibiotics  :    Anti-infectives    None        Objective:   Vitals:   12/10/16 1400 12/10/16 1430 12/10/16 1501 12/10/16 1504  BP: (!) 106/47 (!) 104/48 (!) 112/52 (!) 101/59  Pulse: 71 72 74 73  Resp:   18   Temp:   97.2 F (36.2 C)   TempSrc:      SpO2:      Weight:   125 kg (275 lb 9.2 oz)   Height:        Wt Readings from Last 3 Encounters:  12/10/16 125 kg (275 lb 9.2 oz)  07/17/16 109.8 kg (242 lb)  06/03/16 112.3 kg (247 lb 8 oz)     Intake/Output Summary (Last 24 hours) at 12/10/16 1527 Last data filed at 12/10/16 1504  Gross per 24 hour  Intake              725 ml  Output             2775 ml  Net            -2050 ml     Physical Exam  Awake Alert, Oriented X 3, Normal affect Supple Neck,No JVD  Symmetrical Chest wall movement, Diminished air movement bilaterally at bases, Scattered wheezing. RRR,No Gallops,Rubs or new Murmurs, No Parasternal Heave +ve B.Sounds, Abd Soft, No tenderness,  No rebound - guarding or rigidity. No Cyanosis,  No new Rash or bruise, anasarca   Data Review:    CBC  Recent Labs Lab 12/11/2016 1306 12/06/16 0303 12/07/16 0159 12/08/16 0320 12/09/16 0413 12/10/16 1210  WBC 8.8 7.8 5.7 7.3 6.8 6.7  HGB 10.6* 10.7* 9.3* 9.5* 8.8* 9.2*  HCT 34.5* 35.9* 31.0* 31.4* 28.3* 29.7*  PLT 132* 122* 112* 111* 99* 106*  MCV 85.8 88.9 86.6 86.3 84.0 83.9  MCH 26.4 26.5 26.0 26.1 26.1 26.0  MCHC 30.7 29.8* 30.0 30.3 31.1 31.0  RDW 16.6* 17.1* 16.4* 16.4* 16.6* 16.4*  LYMPHSABS 0.8  --   --   --   --   --  MONOABS 0.4  --   --   --   --   --   EOSABS 0.1  --   --   --   --   --   BASOSABS 0.0  --   --   --   --   --     Chemistries   Recent Labs Lab 11/22/2016 1306  11/15/2016 2058 12/06/16 0303  12/06/16 2015 12/07/16 0159 12/08/16 0320 12/09/16 0413 12/10/16 0542  NA 130*  --   --  129*  --   --  134* 132* 132* 132*  K 5.9*  --  6.0* >7.5*  < > 4.6 4.5 4.2 4.0 3.7  CL 97*  --   --  94*  --   --  98* 96* 95* 95*  CO2 22  --   --  24   --   --  24 25 24 26   GLUCOSE 142*  --   --  273*  --   --  207* 121* 125* 110*  BUN 139*  --   --  142*  --   --  153* 158* 158* 169*  CREATININE 3.12*  < >  --  3.06*  --   --  2.77* 3.34* 3.60* 3.16*  CALCIUM 8.3*  --   --  8.4*  --   --  8.6* 8.3* 8.2* 8.2*  MG  --   --  2.9*  --   --   --   --   --   --   --   AST 22  --   --   --   --   --   --   --   --   --   ALT 19  --   --   --   --   --   --   --   --   --   ALKPHOS 177*  --   --   --   --   --   --   --   --   --   BILITOT 0.5  --   --   --   --   --   --   --   --   --   < > = values in this interval not displayed. ------------------------------------------------------------------------------------------------------------------ No results for input(s): CHOL, HDL, LDLCALC, TRIG, CHOLHDL, LDLDIRECT in the last 72 hours.  Lab Results  Component Value Date   HGBA1C 7.1 (H) 12/02/2016   ------------------------------------------------------------------------------------------------------------------ No results for input(s): TSH, T4TOTAL, T3FREE, THYROIDAB in the last 72 hours.  Invalid input(s): FREET3 ------------------------------------------------------------------------------------------------------------------  Recent Labs  12/08/16 1327  TIBC 311  IRON 14*    Coagulation profile No results for input(s): INR, PROTIME in the last 168 hours.  No results for input(s): DDIMER in the last 72 hours.  Cardiac Enzymes  Recent Labs Lab 11/23/2016 1645 12/10/2016 2058 12/06/16 0303  TROPONINI 0.04* 0.04* 0.04*   ------------------------------------------------------------------------------------------------------------------    Component Value Date/Time   BNP 991.2 (H) 12/10/2016 1306    Inpatient Medications  Scheduled Meds: . aspirin EC  81 mg Oral Daily  . darbepoetin (ARANESP) injection - NON-DIALYSIS  150 mcg Subcutaneous Q Tue-1800  . ferumoxytol  510 mg Intravenous Weekly  . furosemide  120 mg  Intravenous Q8H  . heparin      . insulin aspart  0-5 Units Subcutaneous QHS  . insulin aspart  0-9 Units Subcutaneous TID WC  . insulin glargine  40 Units Subcutaneous  Daily  . lidocaine      . mouth rinse  15 mL Mouth Rinse BID  . polyethylene glycol  17 g Oral Daily  . senna  1 tablet Oral BID  . sodium chloride flush  3 mL Intravenous Q12H   Continuous Infusions: PRN Meds:.sodium chloride, acetaminophen, albuterol, HYDROmorphone (DILAUDID) injection, lidocaine (PF), nitroGLYCERIN, ondansetron (ZOFRAN) IV, sodium chloride, sodium chloride flush, zolpidem  Micro Results Recent Results (from the past 240 hour(s))  MRSA PCR Screening     Status: None   Collection Time: 12/06/16 11:33 AM  Result Value Ref Range Status   MRSA by PCR NEGATIVE NEGATIVE Final    Comment:        The GeneXpert MRSA Assay (FDA approved for NASAL specimens only), is one component of a comprehensive MRSA colonization surveillance program. It is not intended to diagnose MRSA infection nor to guide or monitor treatment for MRSA infections.     Radiology Reports Dg Chest 2 View  Result Date: 12/12/2016 CLINICAL DATA:  Generalize weakness for several days. History of CHF, coronary artery disease and previous MI and, COPD, former smoker. EXAM: CHEST  2 VIEW COMPARISON:  None in PACs FINDINGS: There is a moderate-sized right pleural effusion. There is no pleural effusion on the left. The left lung is well-expanded. Both lungs exhibit increased interstitial density. The cardiac silhouette is enlarged. The pulmonary vascularity is engorged. The patient has undergone previous median sternotomy and CABG. There is calcification in the wall of the aortic arch. The observed bony thorax is unremarkable. IMPRESSION: COPD. CHF with pulmonary interstitial edema. Moderate-sized right pleural effusion. There may be right basilar atelectasis or pneumonia. Thoracic aortic atherosclerosis. Electronically Signed   By: David   [place] M.D.   On: 12/09/2016 14:10   US Renal  Result Date: 12/04/2016 CLINICAL DATA:  Acute on chronic kidney injury. EXAM: RENAL / URINARY TRACT ULTRASOUND COMPLETE COMPARISON:  None. FINDINGS: Right Kidney: Length: 11.1 cm. Mildly increased echogenicity and moderate parenchymal thinning, consistent with medical renal disease. No hydronephrosis. No suspicious focal parenchymal lesion. Left Kidney: Length: 11.0 cm. Mildly increased echogenicity and moderate parenchymal thinning, consistent with medical renal disease. No hydronephrosis. No suspicious focal parenchymal lesion. Bladder: Appears normal for degree of bladder distention. IMPRESSION: Atrophic appearing kidneys with increased echogenicity consistent with medical renal disease. No hydronephrosis. Electronically Signed   By: Ellery Plunk M.D.   On: 11/30/2016 23:17   Ir Fluoro Guide Cv Line Right  Result Date: 12/10/2016 INDICATION: 65 year old female with acute superimposed on chronic renal failure in need of hemodialysis and venous access. EXAM: IR RIGHT FLOURO GUIDE CV LINE; IR ULTRASOUND GUIDANCE VASC ACCESS RIGHT MEDICATIONS: None ANESTHESIA/SEDATION: None FLUOROSCOPY TIME:  Fluoroscopy Time: 0 minutes 18 seconds (6 mGy). COMPLICATIONS: None immediate. PROCEDURE: Informed written consent was obtained from the patient after a thorough discussion of the procedural risks, benefits and alternatives. All questions were addressed. Maximal Sterile Barrier Technique was utilized including caps, mask, sterile gowns, sterile gloves, sterile drape, hand hygiene and skin antiseptic. A timeout was performed prior to the initiation of the procedure. The right internal jugular vein was interrogated with ultrasound and found to be widely patent. An image was obtained and stored for the medical record. Local anesthesia was attained by infiltration with 1% lidocaine. A small dermatotomy was made. Under real-time sonographic guidance, the vessel was  punctured with an 18 gauge needle. A 0.035 wire was then advanced through the needle and into the right heart. The skin tract was dilated  and a 20 cm Mahurkar non tunneled hemodialysis catheter advanced over the wire. The catheter tip was positioned in the upper right atrium. The catheter was flushed with heparinized saline and secured to the skin with 0 Prolene suture. Sterile bandages were applied. IMPRESSION: Successful placement of a right IJ approach 20 cm Mahurkar non tunneled hemodialysis catheter with additional central venous access port. Catheter tips in the upper right atrium and ready for use. Signed, Sterling Big, MD [facility] Electronically Signed   By: Malachy Moan M.D.   On: 12/10/2016 09:41   Ir US Guide Vasc Access Right  Result Date: 12/10/2016 INDICATION: 65 year old female with acute superimposed on chronic renal failure in need of hemodialysis and venous access. EXAM: IR RIGHT FLOURO GUIDE CV LINE; IR ULTRASOUND GUIDANCE VASC ACCESS RIGHT MEDICATIONS: None ANESTHESIA/SEDATION: None FLUOROSCOPY TIME:  Fluoroscopy Time: 0 minutes 18 seconds (6 mGy). COMPLICATIONS: None immediate. PROCEDURE: Informed written consent was obtained from the patient after a thorough discussion of the procedural risks, benefits and alternatives. All questions were addressed. Maximal Sterile Barrier Technique was utilized including caps, mask, sterile gowns, sterile gloves, sterile drape, hand hygiene and skin antiseptic. A timeout was performed prior to the initiation of the procedure. The right internal jugular vein was interrogated with ultrasound and found to be widely patent. An image was obtained and stored for the medical record. Local anesthesia was attained by infiltration with 1% lidocaine. A small dermatotomy was made. Under real-time sonographic guidance, the vessel was punctured with an 18 gauge needle. A 0.035 wire was then  advanced through the needle and into the right heart. The skin tract was dilated and a 20 cm Mahurkar non tunneled hemodialysis catheter advanced over the wire. The catheter tip was positioned in the upper right atrium. The catheter was flushed with heparinized saline and secured to the skin with 0 Prolene suture. Sterile bandages were applied. IMPRESSION: Successful placement of a right IJ approach 20 cm Mahurkar non tunneled hemodialysis catheter with additional central venous access port. Catheter tips in the upper right atrium and ready for use. Signed, Sterling Big, MD [facility] Electronically Signed   By: Malachy Moan M.D.   On: 12/10/2016 09:41      Camreigh Michie M.D on 12/10/2016 at 3:27 PM  Between 7am to 7pm - Pager - [phone]  After 7pm go to [email or web address] - password [facility]  [facility] -  Office  [phone]

## 2016-12-10 NOTE — Progress Notes (Signed)
Patient arrived to unit by bed.  Reviewed treatment plan and this RN agrees with plan.  Report received from bedside RN, Gwen.  Consent obtained.  Patient A & O X 4.   Lung sounds clear to ausculation in all fields. Generalized edema. Cardiac:  NSR.  Removed caps and cleansed RIJ catheter with chlorhedxidine.  Aspirated ports of heparin and flushed them with saline per protocol.  Connected and secured lines, initiated treatment at 1201.  UF Goal of and net fluid removal 2L.  Will continue to monitor.

## 2016-12-10 NOTE — Progress Notes (Signed)
Daily Progress Note   Patient Name: Cassandra Fisher       Date: 12/10/2016 DOB: 1951-09-13  Age: 65 y.o. MRN#: [id number] Attending Physician: Cassandra Patricia, MD Primary Care Physician: Cassandra Norris, MD Admit Date: 12/02/2016  Reason for Consultation/Follow-up: Establishing goals of care, Pain control and Psychosocial/spiritual support  Subjective: Cassandra Fisher is a bit lethargic this morning, which she relates is fatigue from the serious conversations yesterday and the placemen to the temporary dialysis catheter this morning. She does wake to voice. Symptomatically, she reports she is unchanged from yesterday. She still feels a bit short of breath, and has a generalized pain all over her body. She states this pain is related to gout, however it doesn't feel like prior gout flares, and is everywhere. Her husband is at the bedside.   Length of Stay: 4  Current Medications: Scheduled Meds:  . aspirin EC  81 mg Oral Daily  . darbepoetin (ARANESP) injection - NON-DIALYSIS  150 mcg Subcutaneous Q Tue-1800  . ferumoxytol  510 mg Intravenous Weekly  . furosemide  120 mg Intravenous Q8H  . heparin      . insulin aspart  0-5 Units Subcutaneous QHS  . insulin aspart  0-9 Units Subcutaneous TID WC  . insulin glargine  40 Units Subcutaneous Daily  . lidocaine      . mouth rinse  15 mL Mouth Rinse BID  . sodium chloride flush  3 mL Intravenous Q12H    Continuous Infusions:   PRN Meds: sodium chloride, acetaminophen, albuterol, lidocaine (PF), nitroGLYCERIN, ondansetron (ZOFRAN) IV, sodium chloride, sodium chloride flush, zolpidem  Physical Exam  Constitutional: She is oriented to person, place, and time. No distress.  HENT:  Head: Normocephalic and atraumatic.  Mouth/Throat: Oropharynx is clear and moist.  Dried blood in both nares  Eyes:  EOM are normal.  Neck: Normal range of motion. Neck supple.  Cardiovascular:  Intermittent tachycardia  Pulmonary/Chest: She has decreased breath sounds in the right lower field and the left lower field. She has no wheezes. She has rhonchi (throughout). She has no rales.  Increased WOB with any movement or prolonged conversation  Abdominal: Soft. She exhibits distension.  Musculoskeletal: She exhibits edema.  Stiffness in BLE. Pain with passive ROM.  Neurological: She is alert and oriented to person, place, and time.  Skin: Skin is warm and dry. There is pallor.  Psychiatric: She has a normal mood and affect. Judgment and thought content normal. Her speech is delayed. She is slowed and withdrawn. Cognition and memory are normal.           Vital Signs: BP (!) 133/102 (BP Location: Left Arm)   Pulse 71   Temp 97.7 F (36.5 C) (Oral)   Resp 18   Ht 5' 7"  (1.702 m)   Wt 125.7 kg (277 lb 3.2 oz)   SpO2 95%   BMI 43.42 kg/m  SpO2: SpO2: 95 % O2 Device: O2 Device: Nasal Cannula O2 Flow Rate: O2 Flow Rate (L/min): 1 L/min  Intake/output summary:  Intake/Output Summary (Last 24 hours) at 12/10/16 1106 Last data filed at 12/10/16 0500  Gross per 24 hour  Intake  845 ml  Output              975 ml  Net             -130 ml   LBM: Last BM Date: 12/07/16 Baseline Weight: Weight: 113.4 kg (250 lb) Most recent weight: Weight: 125.7 kg (277 lb 3.2 oz)       Palliative Assessment/Data: PPS 40%   Flowsheet Rows   Flowsheet Row Most Recent Value  Intake Tab  Clinical Assessment  Psychosocial & Spiritual Assessment  Palliative Care Outcomes  Patient/Family meeting held?  Yes  Who was at the meeting?  Pt, husband, daughter, multiple granddaughters  Palliative Care Outcomes  Clarified goals of care, Provided psychosocial or spiritual support, Changed CPR status, Completed durable DNR  Patient/Family wishes: Interventions discontinued/not started   -- [Pt would like to start  dialysis]      Patient Active Problem List   Diagnosis Date Noted  . Goals of care, counseling/discussion   . Elevated troponin 11/15/2016  . Acute renal failure superimposed on stage 3 chronic kidney disease ([place]) 11/25/2016  . DM (diabetes mellitus) ([place])   . Type 2 diabetes mellitus with hyperglycemia ([place]) 06/27/2014  . Cardiomyopathy ([place]) 05/25/2014  . CKD (chronic kidney disease) stage 3, GFR 30-59 ml/min 05/25/2014  . Vitamin D deficiency 04/03/2014  . Gout 04/03/2014  . Hyperkalemia 03/25/2014  . Arthralgia 12/22/2013  . Elevated sed rate 09/09/2013  . Carotid stenosis 04/21/2013  . Hx of CABG   . Cerebrovascular disease 01/27/2011  . Diabetes mellitus with renal complications ([place]) 81/19/1478  . HLD (hyperlipidemia) 11/27/2010  . Obesity 11/27/2010  . Anxiety state 11/27/2010  . Essential hypertension 11/27/2010  . MI 11/27/2010  . Coronary atherosclerosis 11/27/2010  . ATRIAL FIBRILLATION 11/27/2010  . COPD with exacerbation ([place]) 11/27/2010  . Backache 11/27/2010    Palliative Care Assessment & Plan   HPI: 65 y.o. female  with past medical history of CAD, CHF with CABG in 2003, HTN, A. FIB, COPD, and type 2 diabetes who was admitted on 11/14/2016 with weakness. Workup showed acute renal failure, hyperkalemia, new LBBB, and significant volume overload with CXR showing CHF with pulmonary interstitial edema, moderate right pleural effusion, and possible right basilar atelectasis vs PNA. Nephrology was consulted and felt ATN secondary to low blood pressure from Vasotec in the setting of diabetic nephropathy. Both Nephrology and Cardiology have advised dialysis (likely short term) for volume management.  Assessment: I met with Cassandra Fisher and her family yesterday (12/26). From our conversation she elected to pursue dialysis in the hopes it may prolong her life. She did understand the potential side effects from dialysis, but felt they were worth it to have more time with  her family. She also elected to have DNR status.   Today, her family was comforted that Nephrology is optimistic she will come off of dialysis at some point. They plan to do 2-3 treatments then reassess. Cassandra Fisher remains withdrawn when I discussed this with her.   Recommendations/Plan:  DNR, Full scope care otherwise and proceed with dialysis  I suspect pt's pain is from significant edema, she already has PRN tylenol ordered, and I will add PRN IV dilaudid (avoiding morphine due to renal issues). I will also start her on a bowel regimen  Code Status:  DNR  Prognosis:   Unable to determine  Discharge Planning:  I would expect discharge with home health services  Care plan was discussed with pt, pt's husband, and  care nurse.  Thank you for allowing the Palliative Medicine Team to assist in the care of this patient.   Time In: 1100 Time Out: 1115 Total Time 15 minutes Prolonged Time Billed  no       Greater than 50%  of this time was spent counseling and coordinating care related to the above assessment and plan.  Charlynn Court, NP Palliative Medicine Team [phone] pager (7a-5p) Team Phone # [phone]

## 2016-12-11 DIAGNOSIS — R5383 Other fatigue: Secondary | ICD-10-CM

## 2016-12-11 DIAGNOSIS — J441 Chronic obstructive pulmonary disease with (acute) exacerbation: Secondary | ICD-10-CM

## 2016-12-11 LAB — HEPATITIS B SURFACE ANTIBODY,QUALITATIVE: HEP B S AB: NONREACTIVE

## 2016-12-11 LAB — GLUCOSE, CAPILLARY
GLUCOSE-CAPILLARY: 91 mg/dL (ref 65–99)
Glucose-Capillary: 93 mg/dL (ref 65–99)
Glucose-Capillary: 137 mg/dL — ABNORMAL HIGH (ref 65–99)
Glucose-Capillary: 89 mg/dL (ref 65–99)
Glucose-Capillary: 87 mg/dL (ref 65–99)

## 2016-12-11 LAB — PROTEIN ELECTROPHORESIS, SERUM
A/G RATIO SPE: 0.8 (ref 0.7–1.7)
Albumin ELP: 2.4 g/dL — ABNORMAL LOW (ref 2.9–4.4)
Alpha-1-Globulin: 0.3 g/dL (ref 0.0–0.4)
Alpha-2-Globulin: 0.8 g/dL (ref 0.4–1.0)
BETA GLOBULIN: 0.9 g/dL (ref 0.7–1.3)
GAMMA GLOBULIN: 1.1 g/dL (ref 0.4–1.8)
Globulin, Total: 3.1 g/dL (ref 2.2–3.9)
Total Protein ELP: 5.5 g/dL — ABNORMAL LOW (ref 6.0–8.5)

## 2016-12-11 LAB — RENAL FUNCTION PANEL
Albumin: 2.3 g/dL — ABNORMAL LOW (ref 3.5–5.0)
Anion gap: 11 (ref 5–15)
BUN: 131 mg/dL — ABNORMAL HIGH (ref 6–20)
CHLORIDE: 94 mmol/L — AB (ref 101–111)
CO2: 26 mmol/L (ref 22–32)
CREATININE: 2.93 mg/dL — AB (ref 0.44–1.00)
Calcium: 8.3 mg/dL — ABNORMAL LOW (ref 8.9–10.3)
GFR calc non Af Amer: 16 mL/min — ABNORMAL LOW (ref >60)
GFR, EST AFRICAN AMERICAN: 18 mL/min — AB (ref >60)
GLUCOSE: 102 mg/dL — AB (ref 65–99)
Phosphorus: 8.8 mg/dL — ABNORMAL HIGH (ref 2.5–4.6)
Potassium: 4 mmol/L (ref 3.5–5.1)
Sodium: 131 mmol/L — ABNORMAL LOW (ref 135–145)

## 2016-12-11 LAB — CBC
HCT: 30.2 % — ABNORMAL LOW (ref 36.0–46.0)
Hemoglobin: 9.2 g/dL — ABNORMAL LOW (ref 12.0–15.0)
MCH: 26.1 pg (ref 26.0–34.0)
MCHC: 30.5 g/dL (ref 30.0–36.0)
MCV: 85.8 fL (ref 78.0–100.0)
PLATELETS: 111 10*3/uL — AB (ref 150–400)
RBC: 3.52 MIL/uL — AB (ref 3.87–5.11)
RDW: 16.6 % — ABNORMAL HIGH (ref 11.5–15.5)
WBC: 7.5 10*3/uL (ref 4.0–10.5)

## 2016-12-11 LAB — PARATHYROID HORMONE, INTACT (NO CA)

## 2016-12-11 LAB — HEPATITIS B CORE ANTIBODY, TOTAL: HEP B C TOTAL AB: NEGATIVE

## 2016-12-11 MED ORDER — NALOXONE HCL 0.4 MG/ML IJ SOLN
INTRAMUSCULAR | Status: AC
  Filled 2016-12-11: qty 1

## 2016-12-11 MED ORDER — OXYCODONE-ACETAMINOPHEN 5-325 MG PO TABS: 1.0000 | ORAL_TABLET | Freq: Once | ORAL | Status: DC

## 2016-12-11 MED ORDER — HYDROMORPHONE HCL 1 MG/ML IJ SOLN
1.0000 mg | Freq: Once | INTRAMUSCULAR | Status: AC
  Administered 2016-12-11: 1 mg via INTRAVENOUS
  Filled 2016-12-11: qty 1

## 2016-12-11 MED ORDER — FUROSEMIDE 10 MG/ML IJ SOLN
120.0000 mg | Freq: Three times a day (TID) | INTRAVENOUS | Status: DC
  Administered 2016-12-11: 120 mg via INTRAVENOUS
  Filled 2016-12-11 (×4): qty 12

## 2016-12-11 MED ORDER — NALOXONE HCL 0.4 MG/ML IJ SOLN
0.4000 mg | INTRAMUSCULAR | Status: DC | PRN
  Administered 2016-12-11: 0.4 mg via INTRAVENOUS

## 2016-12-11 MED ORDER — RENA-VITE PO TABS
1.0000 | ORAL_TABLET | Freq: Every day | ORAL | Status: DC
  Administered 2016-12-11: 1 via ORAL
  Filled 2016-12-11: qty 1

## 2016-12-11 NOTE — Progress Notes (Signed)
Call received per floor RN at 2300 regarding Pt unresponsive after 1mg  Dilaudid IVP. Advised RN to give narcan STAT and update Provider while RRT en route. [facility] NP paged and updated. Narcan given right before my arrival. Pt found in bed lethargy improving rapidly. Orienited x 4 MAEW. Lungs diminished clear. ABD soft. Po2 100% on NRB, HR 90s SR with BBB BP 137/70. Right after Pt began responding she had a minute of bigeminy which quickly resolved. EKG done. RN to update Provider and call if needed.

## 2016-12-11 NOTE — Progress Notes (Signed)
PT Cancellation Note  Patient Details Name: Cassandra Fisher MRN: [id number] DOB: 25-Jan-1951   Cancelled Treatment:    Reason Eval/Treat Not Completed: Patient at procedure or test/unavailable, at HD   [facility] 12/11/2016, 3:02 PM

## 2016-12-11 NOTE — Care Management Important Message (Signed)
Important Message  Patient Details  Name: Cassandra Fisher MRN: [id number] Date of Birth: 1951-06-05   Medicare Important Message Given:  Yes    Oney Tatlock [place] 12/11/2016, 10:49 AM

## 2016-12-11 NOTE — Progress Notes (Signed)
PROGRESS NOTE                                                                                                                                                                                                             Patient Demographics:    Cassandra Fisher, is a 65 y.o. female, DOB - May 16, 1951, RUE:[id number]  Admit date - 11/27/2016   Admitting Physician Haydee Salter, MD  Outpatient Primary MD for the patient is [name], MD  LOS - 5   Chief Complaint  Patient presents with  . Cough    pt has had a cough for the past three weeks increasing cough for the past 3 days        Brief Narrative   65 y.o. female with a Past Medical History negative for chronic kidney disease, diabetes, hypertension and CHF who presents with weakness, workup significant for acute renal failure, as well hyperkalemia, Significant volume overload, potassium> 7.5, with new LBBB, patient Initially refused hemodialysis, hyperkalemia resolved with medical measures, patient continues to have significantly elevated BUN, and volume overload, unresponsive to large dose diuresis, palliative medicine consulted, and agreeable to start dialysis.   Subjective:    Blanch Media today has, No headache, No abdominal pain , she denies any chest pain, Still reports dyspnea   Assessment  & Plan :    Principal Problem:   Acute renal failure superimposed on stage 3 chronic kidney disease (HCC) Active Problems:   Diabetes mellitus with renal complications (HCC)   HLD (hyperlipidemia)   Anxiety state   Essential hypertension   COPD with exacerbation (HCC)   Hyperkalemia   Gout   Cardiomyopathy (HCC)   CKD (chronic kidney disease) stage 3, GFR 30-59 ml/min   Elevated troponin   Goals of care, counseling/discussion   Palliative care by specialist   Lethargy   Acute renal failure and CKD stage III - Baseline creatinine 1.5, with worsening creatinine in the setting of ATN secondary  to low blood pressure on vasotec in the setting of diabetic nephropathy. - Management per renal , no significant improvement with volume status due to diuresis, with significantly elevated BUN , patient finally agreeing to hemodialysis, temporary dialysis catheter inserted 12/27, and started on hemodialysis /27, she is going for hemodialysis again today.  Hyperkalemia - Secondary to renal failure, resolved with medical management,  continue to monitor.  Left bundle branch block - due to hyperkalemia, new onset, continue to monitor on telemetry.  Acute Hypoxic respiratory failure - Secondary to volume overload, related to renal failure versus CHF - Continue with oxygen as needed, continue with volume management with dialysis  COPD - No active wheezing, continue with nebs as needed  Acute on chronic systolic heart failure - Most recent echo in 2011 EF 45%, repeat 2-D echo with EF 40%, severe hypokinesis of the mid anterior septal myocardium. - Continue with IV diuresis, continue to hold ACE inhibitor in the setting of renal failure, continue to hold beta blocker in the setting of soft blood pressure - Continue with volume management during dialysis  Elevated troponin - Likely demand ischemia in the setting of renal failure, hypoxia and respiratory distress, upon a trend is non-ACS pattern 0.03> 0.04> 0.04 - Her left bundle branch block related to hyperkalemia  Diabetes mellitus - Patient on glipizide and insulin NPH 82 units twice a day , Continue with insulin sliding scale, maintenance control, acceptable  on Lantus 40 units subcutaneous daily. - hemoglobin A1c 7.1  Code Status : DO NOT RESUSCITATE  Family Communication  : D/W patient , sister and husband  at bedside today.  Disposition Plan  : Pending further work up.  Consults  :  Renal, cardiology, palliative medicine  Procedures  : Temporary dialysis catheter by IR 12/27  DVT Prophylaxis  :  SCD, heparin stopped given  hematuria and significant oozing from injection site  Lab Results  Component Value Date   PLT 111 (L) 12/11/2016    Antibiotics  :   Anti-infectives    None        Objective:   Vitals:   12/11/16 0544 12/11/16 0600 12/11/16 0755 12/11/16 1128  BP: (!) 90/40 105/65 114/60 (!) 102/44  Pulse: 77 73 73 76  Resp: (!) 23 (!) 22 (!) 21 19  Temp: 97.3 F (36.3 C)  98 F (36.7 C) 97.9 F (36.6 C)  TempSrc: Axillary  Oral Oral  SpO2: 95% 95% 93% 94%  Weight: 118.4 kg (261 lb 0.4 oz)     Height:        Wt Readings from Last 3 Encounters:  12/11/16 118.4 kg (261 lb 0.4 oz)  07/17/16 109.8 kg (242 lb)  06/03/16 112.3 kg (247 lb 8 oz)     Intake/Output Summary (Last 24 hours) at 12/11/16 1238 Last data filed at 12/11/16 1200  Gross per 24 hour  Intake               62 ml  Output             2200 ml  Net            -2138 ml     Physical Exam  Awake Alert, Oriented X 3, Normal affect Supple Neck,No JVD  Symmetrical Chest wall movement, Diminished air movement bilaterally at bases, Scattered wheezing. RRR,No Gallops,Rubs or new Murmurs, No Parasternal Heave +ve B.Sounds, Abd Soft, No tenderness,  No rebound - guarding or rigidity. No Cyanosis,  No new Rash or bruise, anasarca   Data Review:    CBC  Recent Labs Lab June 01, 2016 1306  12/07/16 0159 12/08/16 0320 12/09/16 0413 12/10/16 1210 12/11/16 0256  WBC 8.8  < > 5.7 7.3 6.8 6.7 7.5  HGB 10.6*  < > 9.3* 9.5* 8.8* 9.2* 9.2*  HCT 34.5*  < > 31.0* 31.4* 28.3* 29.7* 30.2*  PLT 132*  < >  112* 111* 99* 106* 111*  MCV 85.8  < > 86.6 86.3 84.0 83.9 85.8  MCH 26.4  < > 26.0 26.1 26.1 26.0 26.1  MCHC 30.7  < > 30.0 30.3 31.1 31.0 30.5  RDW 16.6*  < > 16.4* 16.4* 16.6* 16.4* 16.6*  LYMPHSABS 0.8  --   --   --   --   --   --   MONOABS 0.4  --   --   --   --   --   --   EOSABS 0.1  --   --   --   --   --   --   BASOSABS 0.0  --   --   --   --   --   --   < > = values in this interval not displayed.  Chemistries    Recent Labs Lab 11/23/2016 1306  11/14/2016 2058  12/07/16 0159 12/08/16 0320 12/09/16 0413 12/10/16 0542 12/11/16 0256  NA 130*  --   --   < > 134* 132* 132* 132* 131*  K 5.9*  --  6.0*  < > 4.5 4.2 4.0 3.7 4.0  CL 97*  --   --   < > 98* 96* 95* 95* 94*  CO2 22  --   --   < > 24 25 24 26 26   GLUCOSE 142*  --   --   < > 207* 121* 125* 110* 102*  BUN 139*  --   --   < > 153* 158* 158* 169* 131*  CREATININE 3.12*  < >  --   < > 2.77* 3.34* 3.60* 3.16* 2.93*  CALCIUM 8.3*  --   --   < > 8.6* 8.3* 8.2* 8.2* 8.3*  MG  --   --  2.9*  --   --   --   --   --   --   AST 22  --   --   --   --   --   --   --   --   ALT 19  --   --   --   --   --   --   --   --   ALKPHOS 177*  --   --   --   --   --   --   --   --   BILITOT 0.5  --   --   --   --   --   --   --   --   < > = values in this interval not displayed. ------------------------------------------------------------------------------------------------------------------ No results for input(s): CHOL, HDL, LDLCALC, TRIG, CHOLHDL, LDLDIRECT in the last 72 hours.  Lab Results  Component Value Date   HGBA1C 7.1 (H) 12/07/2016   ------------------------------------------------------------------------------------------------------------------ No results for input(s): TSH, T4TOTAL, T3FREE, THYROIDAB in the last 72 hours.  Invalid input(s): FREET3 ------------------------------------------------------------------------------------------------------------------  Recent Labs  12/08/16 1327  TIBC 311  IRON 14*    Coagulation profile No results for input(s): INR, PROTIME in the last 168 hours.  No results for input(s): DDIMER in the last 72 hours.  Cardiac Enzymes  Recent Labs Lab 12/03/2016 1645 12/12/2016 2058 12/06/16 0303  TROPONINI 0.04* 0.04* 0.04*   ------------------------------------------------------------------------------------------------------------------    Component Value Date/Time   BNP 991.2 (H) 11/23/2016 1306     Inpatient Medications  Scheduled Meds: . aspirin EC  81 mg Oral Daily  . darbepoetin (ARANESP) injection - NON-DIALYSIS  150 mcg Subcutaneous Q Tue-1800  .  ferumoxytol  510 mg Intravenous Weekly  . furosemide  120 mg Intravenous Q8H  . insulin aspart  0-5 Units Subcutaneous QHS  . insulin aspart  0-9 Units Subcutaneous TID WC  . insulin glargine  40 Units Subcutaneous Daily  . mouth rinse  15 mL Mouth Rinse BID  . multivitamin  1 tablet Oral QHS  . polyethylene glycol  17 g Oral Daily  . senna  1 tablet Oral BID  . sodium chloride flush  3 mL Intravenous Q12H   Continuous Infusions: PRN Meds:.sodium chloride, acetaminophen, albuterol, HYDROmorphone (DILAUDID) injection, lidocaine (PF), nitroGLYCERIN, ondansetron (ZOFRAN) IV, sodium chloride, sodium chloride flush, zolpidem  Micro Results Recent Results (from the past 240 hour(s))  MRSA PCR Screening     Status: None   Collection Time: 12/06/16 11:33 AM  Result Value Ref Range Status   MRSA by PCR NEGATIVE NEGATIVE Final    Comment:        The GeneXpert MRSA Assay (FDA approved for NASAL specimens only), is one component of a comprehensive MRSA colonization surveillance program. It is not intended to diagnose MRSA infection nor to guide or monitor treatment for MRSA infections.     Radiology Reports Dg Chest 2 View  Result Date: 2016/12/08 CLINICAL DATA:  Generalize weakness for several days. History of CHF, coronary artery disease and previous MI and, COPD, former smoker. EXAM: CHEST  2 VIEW COMPARISON:  None in PACs FINDINGS: There is a moderate-sized right pleural effusion. There is no pleural effusion on the left. The left lung is well-expanded. Both lungs exhibit increased interstitial density. The cardiac silhouette is enlarged. The pulmonary vascularity is engorged. The patient has undergone previous median sternotomy and CABG. There is calcification in the wall of the aortic arch. The observed bony thorax is  unremarkable. IMPRESSION: COPD. CHF with pulmonary interstitial edema. Moderate-sized right pleural effusion. There may be right basilar atelectasis or pneumonia. Thoracic aortic atherosclerosis. Electronically Signed   By: David  [place] M.D.   On: 12/08/16 14:10   US Renal  Result Date: 2016-12-08 CLINICAL DATA:  Acute on chronic kidney injury. EXAM: RENAL / URINARY TRACT ULTRASOUND COMPLETE COMPARISON:  None. FINDINGS: Right Kidney: Length: 11.1 cm. Mildly increased echogenicity and moderate parenchymal thinning, consistent with medical renal disease. No hydronephrosis. No suspicious focal parenchymal lesion. Left Kidney: Length: 11.0 cm. Mildly increased echogenicity and moderate parenchymal thinning, consistent with medical renal disease. No hydronephrosis. No suspicious focal parenchymal lesion. Bladder: Appears normal for degree of bladder distention. IMPRESSION: Atrophic appearing kidneys with increased echogenicity consistent with medical renal disease. No hydronephrosis. Electronically Signed   By: Ellery Plunk M.D.   On: 2016-12-08 23:17   Ir Fluoro Guide Cv Line Right  Result Date: 12/10/2016 INDICATION: 65 year old female with acute superimposed on chronic renal failure in need of hemodialysis and venous access. EXAM: IR RIGHT FLOURO GUIDE CV LINE; IR ULTRASOUND GUIDANCE VASC ACCESS RIGHT MEDICATIONS: None ANESTHESIA/SEDATION: None FLUOROSCOPY TIME:  Fluoroscopy Time: 0 minutes 18 seconds (6 mGy). COMPLICATIONS: None immediate. PROCEDURE: Informed written consent was obtained from the patient after a thorough discussion of the procedural risks, benefits and alternatives. All questions were addressed. Maximal Sterile Barrier Technique was utilized including caps, mask, sterile gowns, sterile gloves, sterile drape, hand hygiene and skin antiseptic. A timeout was performed prior to the initiation of the procedure. The right internal jugular vein was interrogated with ultrasound and found to  be widely patent. An image was obtained and stored for the medical record. Local anesthesia was attained  by infiltration with 1% lidocaine. A small dermatotomy was made. Under real-time sonographic guidance, the vessel was punctured with an 18 gauge needle. A 0.035 wire was then advanced through the needle and into the right heart. The skin tract was dilated and a 20 cm Mahurkar non tunneled hemodialysis catheter advanced over the wire. The catheter tip was positioned in the upper right atrium. The catheter was flushed with heparinized saline and secured to the skin with 0 Prolene suture. Sterile bandages were applied. IMPRESSION: Successful placement of a right IJ approach 20 cm Mahurkar non tunneled hemodialysis catheter with additional central venous access port. Catheter tips in the upper right atrium and ready for use. Signed, Sterling Big, MD [facility] Electronically Signed   By: Malachy Moan M.D.   On: 12/10/2016 09:41   Ir US Guide Vasc Access Right  Result Date: 12/10/2016 INDICATION: 65 year old female with acute superimposed on chronic renal failure in need of hemodialysis and venous access. EXAM: IR RIGHT FLOURO GUIDE CV LINE; IR ULTRASOUND GUIDANCE VASC ACCESS RIGHT MEDICATIONS: None ANESTHESIA/SEDATION: None FLUOROSCOPY TIME:  Fluoroscopy Time: 0 minutes 18 seconds (6 mGy). COMPLICATIONS: None immediate. PROCEDURE: Informed written consent was obtained from the patient after a thorough discussion of the procedural risks, benefits and alternatives. All questions were addressed. Maximal Sterile Barrier Technique was utilized including caps, mask, sterile gowns, sterile gloves, sterile drape, hand hygiene and skin antiseptic. A timeout was performed prior to the initiation of the procedure. The right internal jugular vein was interrogated with ultrasound and found to be widely patent. An image was obtained and stored for the  medical record. Local anesthesia was attained by infiltration with 1% lidocaine. A small dermatotomy was made. Under real-time sonographic guidance, the vessel was punctured with an 18 gauge needle. A 0.035 wire was then advanced through the needle and into the right heart. The skin tract was dilated and a 20 cm Mahurkar non tunneled hemodialysis catheter advanced over the wire. The catheter tip was positioned in the upper right atrium. The catheter was flushed with heparinized saline and secured to the skin with 0 Prolene suture. Sterile bandages were applied. IMPRESSION: Successful placement of a right IJ approach 20 cm Mahurkar non tunneled hemodialysis catheter with additional central venous access port. Catheter tips in the upper right atrium and ready for use. Signed, Sterling Big, MD [facility] Radiology Electronically Signed   By: Malachy Moan M.D.   On: 12/10/2016 09:41      ELGERGAWY, DAWOOD M.D on 12/11/2016 at 12:38 PM  Between 7am to 7pm - Pager - [phone]  After 7pm go to [email or web address] - password [facility]  [facility] -  Office  [phone]

## 2016-12-11 NOTE — Progress Notes (Addendum)
Daily Progress Note   Patient Name: Cassandra Fisher       Date: 12/11/2016 DOB: 10/20/1951  Age: 65 y.o. MRN#: [id number] Attending Physician: Cassandra Patricia, MD Primary Care Physician: Cassandra Norris, MD Admit Date: 12/06/2016  Reason for Consultation/Follow-up: Establishing goals of care, Pain control and Psychosocial/spiritual support  Subjective: Cassandra Fisher is very lethargic this morning, which Cassandra Fisher reports has been ongoing since Cassandra dialysis session yesterday. She did received Cassandra Ambien at midnight, which may have a prolonged effect and be worsening Cassandra lethargy in the morning. Cassandra intake has also been poor. She reports feeling very weak. Last BM on Saturday (12/23), however this is not abnormal for Cassandra. Cassandra Fisher remains at Cassandra bedside. He is very concerned about he lack of improvement since starting dialysis.   Length of Stay: 5  Current Medications: Scheduled Meds:  . aspirin EC  81 mg Oral Daily  . darbepoetin (ARANESP) injection - NON-DIALYSIS  150 mcg Subcutaneous Q Tue-1800  . ferumoxytol  510 mg Intravenous Weekly  . furosemide  120 mg Intravenous Q8H  . insulin aspart  0-5 Units Subcutaneous QHS  . insulin aspart  0-9 Units Subcutaneous TID WC  . insulin glargine  40 Units Subcutaneous Daily  . mouth rinse  15 mL Mouth Rinse BID  . multivitamin  1 tablet Oral QHS  . polyethylene glycol  17 g Oral Daily  . senna  1 tablet Oral BID  . sodium chloride flush  3 mL Intravenous Q12H    Continuous Infusions:   PRN Meds: sodium chloride, acetaminophen, albuterol, HYDROmorphone (DILAUDID) injection, lidocaine (PF), nitroGLYCERIN, ondansetron (ZOFRAN) IV, sodium chloride, sodium chloride flush, zolpidem  Physical Exam  Constitutional: She is oriented to person, place, and time. No distress. Very lethargic.  HENT:    Head: Normocephalic and atraumatic.  Mouth/Throat: Oropharynx is clear and moist.  Eyes: EOM are normal.  Neck: Normal range of motion. Neck supple.  Cardiovascular: Regular rate. Pulmonary/Chest: She has decreased breath sounds in the right lower field and the left lower field. She has no wheezes.  Increased WOB with prolonged conversation. Crackles in bilateral bases. Abdominal: Soft. She exhibits distension.  Musculoskeletal: She exhibits edema.  Stiffness in BLE. Pain with passive ROM.  Neurological: She is alert and oriented to person, place, and time.  Skin: Skin is warm and dry. There is pallor.  Psychiatric: She has a normal mood and affect. Judgment and thought content normal. Cassandra speech is delayed. She is slowed and withdrawn. Cognition and memory are normal.           Vital Signs: BP 114/60 (BP Location: Right Arm)   Pulse 73   Temp 98 F (36.7 C) (Oral)   Resp (!) 21   Ht 5' 7"  (1.702 m)   Wt 118.4 kg (261 lb 0.4 oz)   SpO2 93%   BMI 40.88 kg/m  SpO2: SpO2: 93 % O2 Device: O2 Device: Nasal Cannula O2 Flow Rate: O2 Flow Rate (L/min): 2 L/min  Intake/output summary:   Intake/Output Summary (Last 24 hours) at 12/11/16 0910 Last data filed at 12/11/16 0023  Gross per 24 hour  Intake  62 ml  Output             2350 ml  Net            -2288 ml   LBM: Last BM Date: 12/07/16 Baseline Weight: Weight: 113.4 kg (250 lb) Most recent weight: Weight: 118.4 kg (261 lb 0.4 oz)       Palliative Assessment/Data: PPS 30%   Flowsheet Rows   Flowsheet Row Most Recent Value  Intake Tab  Clinical Assessment  Psychosocial & Spiritual Assessment  Palliative Care Outcomes  Patient/Family meeting held?  Yes  Who was at the meeting?  Pt, Fisher, daughter, multiple granddaughters  Palliative Care Outcomes  Clarified goals of care, Provided psychosocial or spiritual support, Changed CPR status, Completed durable DNR  Patient/Family wishes: Interventions  discontinued/not started   -- [Pt would like to start dialysis]      Patient Active Problem List   Diagnosis Date Noted  . Palliative care by specialist   . Goals of care, counseling/discussion   . Elevated troponin 11/26/2016  . Acute renal failure superimposed on stage 3 chronic kidney disease ([place]) 11/18/2016  . DM (diabetes mellitus) ([place])   . Type 2 diabetes mellitus with hyperglycemia ([place]) 06/27/2014  . Cardiomyopathy ([place]) 05/25/2014  . CKD (chronic kidney disease) stage 3, GFR 30-59 ml/min 05/25/2014  . Vitamin D deficiency 04/03/2014  . Gout 04/03/2014  . Hyperkalemia 03/25/2014  . Arthralgia 12/22/2013  . Elevated sed rate 09/09/2013  . Carotid stenosis 04/21/2013  . Hx of CABG   . Cerebrovascular disease 01/27/2011  . Diabetes mellitus with renal complications ([place]) 69/67/8938  . HLD (hyperlipidemia) 11/27/2010  . Obesity 11/27/2010  . Anxiety state 11/27/2010  . Essential hypertension 11/27/2010  . MI 11/27/2010  . Coronary atherosclerosis 11/27/2010  . ATRIAL FIBRILLATION 11/27/2010  . COPD with exacerbation ([place]) 11/27/2010  . Backache 11/27/2010    Palliative Care Assessment & Plan   HPI: 65 y.o. female  with past medical history of CAD, CHF with CABG in 2003, HTN, A. FIB, COPD, and type 2 diabetes who was admitted on 12/12/2016 with weakness. Workup showed acute renal failure, hyperkalemia, new LBBB, and significant volume overload with CXR showing CHF with pulmonary interstitial edema, moderate right pleural effusion, and possible right basilar atelectasis vs PNA. Nephrology was consulted and felt ATN secondary to low blood pressure from Vasotec in the setting of diabetic nephropathy. Both Nephrology and Cardiology have advised dialysis (likely short term) for volume management.  Assessment: I met with Mrs. Head and Cassandra family on 12/26. From our conversation she elected to pursue dialysis in the hopes it may prolong Cassandra life. She did understand the  potential side effects from dialysis, but felt they were worth it to have more time with Cassandra family. She also elected to have DNR status. Nephrology was optimistic that she could come off dialysis at some point, they plan to do 2-3 treatments then reassess.   Today, both Mr. Pfost and his wife were concerned about Cassandra lack of improvement and worsening functional status. I had a long discussion with them about the side effects of dialysis, which include significant lethargy.  I also explained that Cassandra lack of oral intake would also contribute to Cassandra weakness and lethargy.   Recommendations/Plan:  DNR, Full scope care otherwise and continue with dialysis  Family educated on side effects of dialysis, and way to combat it. Specifically, we discussed frequent small meals, breaks between activities, maintaining a normal sleep.wake cycle, and engaging  in physical activity.   I would recommend consulting PT and OT to aid in mobilization and supporting self care, as well as nutrition to help facilitate improved oral intake  I have also adjusted Cassandra Ambien to not be given after 10pm, which I explained to pt and Fisher. Hopefully this will improve Cassandra lethargy in the morning.   Pain was well managed with PRN IV dilaudid, which I will continue along with PRN tylenol. (avoiding morphine due to renal issues). I will also continue Cassandra  bowel regimen  **Pt's worsening functional status is very concerning to me, which I expressed to Mrs. Zieske and Cassandra Fisher. They wish to continue to proceed with dialysis in hopes she will have improvement/recovery. Palliative will continue to follow and support pt and family, however I suspect goals of care will need to be readdressed over the weekend/early next week based on Nephrology's reassessment of Cassandra need for ongoing dialysis and Cassandra ability to start sufficiently moving and eating.  Code Status:  DNR  Prognosis:   Unable to determine  Discharge Planning:  I  would expect discharge with home health services  Care plan was discussed with pt, pt's Fisher, and care nurse.  Thank you for allowing the Palliative Medicine Team to assist in the care of this patient.   Time In: 0900 Time Out: 0930 Total Time 30 minutes Prolonged Time Billed  no       Greater than 50%  of this time was spent counseling and coordinating care related to the above assessment and plan.  Charlynn Court, NP Palliative Medicine Team [phone] pager (7a-5p) Team Phone # [phone]

## 2016-12-11 NOTE — Progress Notes (Signed)
S: Appetite marginal  + weakness O:BP 105/65 Comment: re-checked BP during lasix administration  Pulse 73   Temp 97.3 F (36.3 C) (Axillary)   Resp (!) 22   Ht 5\' 7"  (1.702 m)   Wt 118.4 kg (261 lb 0.4 oz)   SpO2 95%   BMI 40.88 kg/m   Intake/Output Summary (Last 24 hours) at 12/11/16 0747 Last data filed at 12/11/16 0023  Gross per 24 hour  Intake               62 ml  Output             2350 ml  Net            -2288 ml   Weight change: 1.263 kg (2 lb 12.6 oz) QMV:HQIONGen:awake and alert CVS: RRR Resp: Basilar crackles Abd: + BS NT ND Ext: 2+ edema NEURO:CNI Ox3  + asterixis   . aspirin EC  81 mg Oral Daily  . darbepoetin (ARANESP) injection - NON-DIALYSIS  150 mcg Subcutaneous Q Tue-1800  . ferumoxytol  510 mg Intravenous Weekly  . furosemide  120 mg Intravenous Q8H  . insulin aspart  0-5 Units Subcutaneous QHS  . insulin aspart  0-9 Units Subcutaneous TID WC  . insulin glargine  40 Units Subcutaneous Daily  . mouth rinse  15 mL Mouth Rinse BID  . polyethylene glycol  17 g Oral Daily  . senna  1 tablet Oral BID  . sodium chloride flush  3 mL Intravenous Q12H   Ir Fluoro Guide Cv Line Right  Result Date: 12/10/2016 INDICATION: 65 year old female with acute superimposed on chronic renal failure in need of hemodialysis and venous access. EXAM: IR RIGHT FLOURO GUIDE CV LINE; IR ULTRASOUND GUIDANCE VASC ACCESS RIGHT MEDICATIONS: None ANESTHESIA/SEDATION: None FLUOROSCOPY TIME:  Fluoroscopy Time: 0 minutes 18 seconds (6 mGy). COMPLICATIONS: None immediate. PROCEDURE: Informed written consent was obtained from the patient after a thorough discussion of the procedural risks, benefits and alternatives. All questions were addressed. Maximal Sterile Barrier Technique was utilized including caps, mask, sterile gowns, sterile gloves, sterile drape, hand hygiene and skin antiseptic. A timeout was performed prior to the initiation of the procedure. The right internal jugular vein was  interrogated with ultrasound and found to be widely patent. An image was obtained and stored for the medical record. Local anesthesia was attained by infiltration with 1% lidocaine. A small dermatotomy was made. Under real-time sonographic guidance, the vessel was punctured with an 18 gauge needle. A 0.035 wire was then advanced through the needle and into the right heart. The skin tract was dilated and a 20 cm Mahurkar non tunneled hemodialysis catheter advanced over the wire. The catheter tip was positioned in the upper right atrium. The catheter was flushed with heparinized saline and secured to the skin with 0 Prolene suture. Sterile bandages were applied. IMPRESSION: Successful placement of a right IJ approach 20 cm Mahurkar non tunneled hemodialysis catheter with additional central venous access port. Catheter tips in the upper right atrium and ready for use. Signed, Sterling BigHeath K. McCullough, MD [facility] Electronically Signed   By: Malachy MoanHeath  McCullough M.D.   On: 12/10/2016 09:41   Ir Koreas Guide Vasc Access Right  Result Date: 12/10/2016 INDICATION: 65 year old female with acute superimposed on chronic renal failure in need of hemodialysis and venous access. EXAM: IR RIGHT FLOURO GUIDE CV LINE; IR ULTRASOUND GUIDANCE VASC ACCESS RIGHT MEDICATIONS: None ANESTHESIA/SEDATION: None FLUOROSCOPY TIME:  Fluoroscopy Time: 0  minutes 18 seconds (6 mGy). COMPLICATIONS: None immediate. PROCEDURE: Informed written consent was obtained from the patient after a thorough discussion of the procedural risks, benefits and alternatives. All questions were addressed. Maximal Sterile Barrier Technique was utilized including caps, mask, sterile gowns, sterile gloves, sterile drape, hand hygiene and skin antiseptic. A timeout was performed prior to the initiation of the procedure. The right internal jugular vein was interrogated with ultrasound and found to be widely patent. An  image was obtained and stored for the medical record. Local anesthesia was attained by infiltration with 1% lidocaine. A small dermatotomy was made. Under real-time sonographic guidance, the vessel was punctured with an 18 gauge needle. A 0.035 wire was then advanced through the needle and into the right heart. The skin tract was dilated and a 20 cm Mahurkar non tunneled hemodialysis catheter advanced over the wire. The catheter tip was positioned in the upper right atrium. The catheter was flushed with heparinized saline and secured to the skin with 0 Prolene suture. Sterile bandages were applied. IMPRESSION: Successful placement of a right IJ approach 20 cm Mahurkar non tunneled hemodialysis catheter with additional central venous access port. Catheter tips in the upper right atrium and ready for use. Signed, Sterling Big, MD [facility] Electronically Signed   By: Malachy Moan M.D.   On: 12/10/2016 09:41   BMET    Component Value Date/Time   NA 131 (L) 12/11/2016 0256   K 4.0 12/11/2016 0256   CL 94 (L) 12/11/2016 0256   CO2 26 12/11/2016 0256   GLUCOSE 102 (H) 12/11/2016 0256   BUN 131 (H) 12/11/2016 0256   CREATININE 2.93 (H) 12/11/2016 0256   CALCIUM 8.3 (L) 12/11/2016 0256   GFRNONAA 16 (L) 12/11/2016 0256   GFRAA 18 (L) 12/11/2016 0256   CBC    Component Value Date/Time   WBC 7.5 12/11/2016 0256   RBC 3.52 (L) 12/11/2016 0256   HGB 9.2 (L) 12/11/2016 0256   HCT 30.2 (L) 12/11/2016 0256   PLT 111 (L) 12/11/2016 0256   MCV 85.8 12/11/2016 0256   MCH 26.1 12/11/2016 0256   MCHC 30.5 12/11/2016 0256   RDW 16.6 (H) 12/11/2016 0256   LYMPHSABS 0.8 11/23/2016 1306   MONOABS 0.4 12/12/2016 1306   EOSABS 0.1 11/30/2016 1306   BASOSABS 0.0 11/26/2016 1306     Assessment: 1. Acute on CKD 3 sec hypotension on ACE 2. Anemia, Fe def getting feraheme 3. DM 4. Cardiomyopathy  EF 40% 5. Mild thrombocytopenia,  stable  Plan: 1. Plan HD again today 2. Daily labs  Shown Dissinger T

## 2016-12-12 ENCOUNTER — Inpatient Hospital Stay (HOSPITAL_COMMUNITY): Payer: PPO

## 2016-12-12 DIAGNOSIS — J9622 Acute and chronic respiratory failure with hypercapnia: Secondary | ICD-10-CM

## 2016-12-12 DIAGNOSIS — J9621 Acute and chronic respiratory failure with hypoxia: Secondary | ICD-10-CM

## 2016-12-12 DIAGNOSIS — Z515 Encounter for palliative care: Secondary | ICD-10-CM

## 2016-12-12 LAB — BLOOD GAS, ARTERIAL
Acid-base deficit: 1.8 mmol/L (ref 0.0–2.0)
Bicarbonate: 27.7 mmol/L (ref 20.0–28.0)
Drawn by: 441661
FIO2: 1
O2 Saturation: 93.4 %
Patient temperature: 98.6
pCO2 arterial: 102 mmHg (ref 32.0–48.0)
pH, Arterial: 7.061 — CL (ref 7.350–7.450)
pO2, Arterial: 88.4 mmHg (ref 83.0–108.0)

## 2016-12-12 LAB — GLUCOSE, CAPILLARY: Glucose-Capillary: 99 mg/dL (ref 65–99)

## 2016-12-12 MED ORDER — LORAZEPAM 2 MG/ML PO CONC: 1.0000 mg | ORAL | Status: DC | PRN

## 2016-12-12 MED ORDER — POLYVINYL ALCOHOL 1.4 % OP SOLN
1.0000 [drp] | Freq: Four times a day (QID) | OPHTHALMIC | Status: DC | PRN
  Filled 2016-12-12: qty 15

## 2016-12-12 MED ORDER — GLYCOPYRROLATE 0.2 MG/ML IJ SOLN
0.2000 mg | INTRAMUSCULAR | Status: DC | PRN
  Administered 2016-12-12: 0.2 mg via INTRAVENOUS
  Filled 2016-12-12: qty 1

## 2016-12-12 MED ORDER — HYDROMORPHONE BOLUS VIA INFUSION
0.2000 mg | INTRAVENOUS | Status: DC | PRN
  Administered 2016-12-12 (×7): 0.2 mg via INTRAVENOUS
  Filled 2016-12-12: qty 1

## 2016-12-12 MED ORDER — BIOTENE DRY MOUTH MT LIQD: 15.0000 mL | OROMUCOSAL | Status: DC | PRN

## 2016-12-12 MED ORDER — DIPHENHYDRAMINE HCL 50 MG/ML IJ SOLN: 12.5000 mg | INTRAMUSCULAR | Status: DC | PRN

## 2016-12-12 MED ORDER — GLYCOPYRROLATE 0.2 MG/ML IJ SOLN
0.2000 mg | INTRAMUSCULAR | Status: DC | PRN
Start: 2016-12-12 — End: 2016-12-12

## 2016-12-12 MED ORDER — LORAZEPAM 1 MG PO TABS: 1.0000 mg | ORAL_TABLET | ORAL | Status: DC | PRN

## 2016-12-12 MED ORDER — SODIUM CHLORIDE 0.9 % IV SOLN
0.2000 mg/h | INTRAVENOUS | Status: DC
  Administered 2016-12-12: 0.2 mg/h via INTRAVENOUS
  Filled 2016-12-12: qty 2.5

## 2016-12-12 MED ORDER — GLYCOPYRROLATE 1 MG PO TABS: 1.0000 mg | ORAL_TABLET | ORAL | Status: DC | PRN

## 2016-12-12 MED ORDER — GLYCOPYRROLATE 0.2 MG/ML IJ SOLN
0.1000 mg | Freq: Once | INTRAMUSCULAR | Status: AC
  Administered 2016-12-12: 0.1 mg via INTRAVENOUS
  Filled 2016-12-12: qty 1

## 2016-12-12 MED ORDER — LORAZEPAM 2 MG/ML IJ SOLN: 1.0000 mg | INTRAMUSCULAR | Status: DC | PRN

## 2016-12-12 MED ORDER — HYDROMORPHONE HCL 1 MG/ML IJ SOLN: 0.5000 mg | INTRAMUSCULAR | Status: DC | PRN

## 2016-12-12 NOTE — Progress Notes (Signed)
Patient's family refused any further lab testing.

## 2016-12-12 NOTE — Progress Notes (Signed)
Panic ABG values verbally given to Rapid Response [employer].

## 2016-12-12 NOTE — Care Management (Signed)
Pt transitioned to Comfort Care 12/12/16

## 2016-12-12 NOTE — Progress Notes (Signed)
Note plans for comfort care.  Spoke with Dr Randol Kern.  Will sign off

## 2016-12-12 NOTE — Progress Notes (Signed)
Shift event note:  Notified regarding pt w/ decreased LOC. Was unable to immediately respond to bedside so called RR RN "Brook" who responded to bedside and assessed. Earlier in the evening pt became somnolent after IV Dilaudid but returned to baseline after IV Narcan. RR RN reports pt minimally responsive at this time (please see RR RN note) and ABG reveals > 7.06/102/88.4. Once able to respond to bedside pt noted to be resting in NAD. She appears comfortable w/o air hunger or other untoward terminal symptoms. Pt's husband and children at bedside and appear quite accepting of the fact that pt clearly appears to be actively dying. Husband (of 50 years) states "I do not want her to suffer anymore, she has been fighting this for a long time". Family assured that we will focus on pt's comfort and symptom management as indicated and they are grateful. Will continue to monitor closely and provide symptom management as indicated to assure pt's comfort.  Leanne Chang, NP-C [facility] Pager [phone]

## 2016-12-12 NOTE — Progress Notes (Signed)
Nutrition Brief Note  Chart reviewed. Pt now transitioning to comfort care.  No further nutrition interventions warranted at this time.  Please re-consult as needed.   Kedrick Mcnamee A. Yarden Hillis, RD, LDN, CDE Pager: [phone] After hours Pager: [phone]  

## 2016-12-12 NOTE — Progress Notes (Signed)
Patient less responsive. Sounds rhonchorous without auscultation. Oxygen dropping in the high 80s, low 90s despite on nasal canula 5 liters. Increased to 10 liters. Placed on non-rebreather 15 LPM. Oxygen level increased to 95-100%. No response to sternal rub. Will grunt and moan occasionally. Suction at bedside. ABG done pH arterial 7.061 and pCO2 102. NP Schorr aware. RR nurse, Nehemiah Settle came to assess patient again. NP Schorr came up to speak to patient's family about care. Per Nehemiah Settle, to have patient on comfort care. Continue medications. Family is at bedside. Will continue to monitor.

## 2016-12-12 NOTE — Progress Notes (Signed)
PROGRESS NOTE                                                                                                                                                                                                             Patient Demographics:    Cassandra Fisher, is a 65 y.o. female, DOB - 07-Oct-1951, OVF:[id number]  Admit date - 12/11/2016   Admitting Physician Haydee Salter, MD  Outpatient Primary MD for the patient is [name], MD  LOS - 6   Chief Complaint  Patient presents with  . Cough    pt has had a cough for the past three weeks increasing cough for the past 3 days        Brief Narrative   65 y.o. female with a Past Medical History negative for chronic kidney disease, diabetes, hypertension and CHF who presents with weakness, And acute renal failure with hyperkalemia and volume overload, , Significant volume overload, potassium> 7.5, with new LBBB, patient Initially refused hemodialysis, hyperkalemia corrected with medication , significant uremia, elevated BUN and volume overload, started dialysis on 12/27 . - With worsening mental status and respiratory failure, workup significant for CO2 retention, seen by palliative medicine, patient and family want to stop dialysis, and plan is for comfort.   Subjective:    Cassandra Fisher today Is lethargic, cannot provide any complaints , problems response called overnight that she was lethargic, and respiratory distress.   Assessment  & Plan :    Principal Problem:   Acute renal failure superimposed on stage 3 chronic kidney disease (HCC) Active Problems:   Diabetes mellitus with renal complications (HCC)   HLD (hyperlipidemia)   Anxiety state   Essential hypertension   COPD with exacerbation (HCC)   Hyperkalemia   Gout   Cardiomyopathy (HCC)   CKD (chronic kidney disease) stage 3, GFR 30-59 ml/min   Elevated troponin   Goals of care, counseling/discussion   Palliative care by specialist  Lethargy   Comfort measures only status   End of life care  Acute hypoxic/hypercapnic respiratory failure - Multifactorial, secondary to volume overload, CHF and COPD - With significant deterioration of clinical status overnight, ABG with pH of 7.06, PCO2 of 102 - At this point family does not want to pursue any aggressive measures, their main goal is comfort, seen by palliative medicine.  Acute  renal failure and CKD stage III - Baseline creatinine 1.5, with worsening creatinine in the setting of ATN secondary to low blood pressure on vasotec in the setting of diabetic nephropathy. -no significant improvement with volume status with  diuresis, with significantly elevated BUN , patient finally agreeing to hemodialysis, temporary dialysis catheter inserted 12/27, and started on hemodialysis /27, she is going for hemodialysis again today. - Currently comfort care, no further dialysis  Hyperkalemia - Secondary to renal failure, resolved with medical management  Left bundle branch block - due to hyperkalemia, new onset,    COPD - She is comfort care  Acute on chronic systolic heart failure - Most recent echo in 2011 EF 45%, repeat 2-D echo with EF 40%, severe hypokinesis of the mid anterior septal myocardium.  Elevated troponin - Likely demand ischemia in the setting of renal failure, hypoxia and respiratory distress, upon a trend is non-ACS pattern 0.03> 0.04> 0.04 - Her left bundle branch block related to hyperkalemia  Diabetes mellitus. - hemoglobin A1c 7.1   Code Status : DO NOT RESUSCITATE/comfort care  Family Communication  : D/W husband, daughter and sister at bedside, discussed goals of care, at this point main goal is comfort.  Disposition Plan  : Despite this during hospital stay  Consults  :  Renal, cardiology, palliative medicine  Procedures  : Temporary dialysis catheter by IR 12/27  DVT Prophylaxis  :  comfort  Lab Results  Component Value Date   PLT 111 (L)  12/11/2016    Antibiotics  :   Anti-infectives    None        Objective:   Vitals:   12/11/16 2305 12/12/16 0020 12/12/16 0158 12/12/16 0453  BP: 137/70 (!) 95/30 108/60 (!) 119/56  Pulse: 96 (!) 43 93 86  Resp: (!) 22 19 13 14   Temp:  98.6 F (37 C) 97.6 F (36.4 C)   TempSrc:  Oral Oral   SpO2: 100% 95% 91% 95%  Weight:      Height:        Wt Readings from Last 3 Encounters:  12/11/16 123.7 kg (272 lb 11.3 oz)  07/17/16 109.8 kg (242 lb)  06/03/16 112.3 kg (247 lb 8 oz)     Intake/Output Summary (Last 24 hours) at 12/12/16 1443 Last data filed at 12/12/16 0900  Gross per 24 hour  Intake               62 ml  Output             2075 ml  Net            -2013 ml     Physical Exam  Abundant, unresponsive Symmetrical Chest wall movement, shallow breath RRR,No Gallops,Rubs or new Murmurs, No Parasternal Heave +ve B.Sounds, Abd Soft, No tenderness, No Cyanosis,  No new Rash or bruise, anasarca   Data Review:    CBC  Recent Labs Lab 12/07/16 0159 12/08/16 0320 12/09/16 0413 12/10/16 1210 12/11/16 0256  WBC 5.7 7.3 6.8 6.7 7.5  HGB 9.3* 9.5* 8.8* 9.2* 9.2*  HCT 31.0* 31.4* 28.3* 29.7* 30.2*  PLT 112* 111* 99* 106* 111*  MCV 86.6 86.3 84.0 83.9 85.8  MCH 26.0 26.1 26.1 26.0 26.1  MCHC 30.0 30.3 31.1 31.0 30.5  RDW 16.4* 16.4* 16.6* 16.4* 16.6*    Chemistries   Recent Labs Lab 12/04/2016 2058  12/07/16 0159 12/08/16 0320 12/09/16 0413 12/10/16 0542 12/11/16 0256  NA  --   < > 134*  132* 132* 132* 131*  K 6.0*  < > 4.5 4.2 4.0 3.7 4.0  CL  --   < > 98* 96* 95* 95* 94*  CO2  --   < > 24 25 24 26 26   GLUCOSE  --   < > 207* 121* 125* 110* 102*  BUN  --   < > 153* 158* 158* 169* 131*  CREATININE  --   < > 2.77* 3.34* 3.60* 3.16* 2.93*  CALCIUM  --   < > 8.6* 8.3* 8.2* 8.2* 8.3*  MG 2.9*  --   --   --   --   --   --   < > = values in this interval not  displayed. ------------------------------------------------------------------------------------------------------------------ No results for input(s): CHOL, HDL, LDLCALC, TRIG, CHOLHDL, LDLDIRECT in the last 72 hours.  Lab Results  Component Value Date   HGBA1C 7.1 (H) 11/27/2016   ------------------------------------------------------------------------------------------------------------------ No results for input(s): TSH, T4TOTAL, T3FREE, THYROIDAB in the last 72 hours.  Invalid input(s): FREET3 ------------------------------------------------------------------------------------------------------------------ No results for input(s): VITAMINB12, FOLATE, FERRITIN, TIBC, IRON, RETICCTPCT in the last 72 hours.  Coagulation profile No results for input(s): INR, PROTIME in the last 168 hours.  No results for input(s): DDIMER in the last 72 hours.  Cardiac Enzymes  Recent Labs Lab 11/19/2016 1645 12/02/2016 2058 12/06/16 0303  TROPONINI 0.04* 0.04* 0.04*   ------------------------------------------------------------------------------------------------------------------    Component Value Date/Time   BNP 991.2 (H) 11/17/2016 1306    Inpatient Medications  Scheduled Meds: . mouth rinse  15 mL Mouth Rinse BID  . sodium chloride flush  3 mL Intravenous Q12H   Continuous Infusions: . HYDROmorphone 0.2 mg/hr (12/12/16 0952)   PRN Meds:.sodium chloride, albuterol, antiseptic oral rinse, diphenhydrAMINE, [DISCONTINUED] glycopyrrolate **OR** [DISCONTINUED] glycopyrrolate **OR** glycopyrrolate, HYDROmorphone **AND** HYDROmorphone, lidocaine (PF), [DISCONTINUED] LORazepam **OR** [DISCONTINUED] LORazepam **OR** LORazepam, ondansetron (ZOFRAN) IV, polyvinyl alcohol, sodium chloride flush  Micro Results Recent Results (from the past 240 hour(s))  MRSA PCR Screening     Status: None   Collection Time: 12/06/16 11:33 AM  Result Value Ref Range Status   MRSA by PCR NEGATIVE NEGATIVE Final     Comment:        The GeneXpert MRSA Assay (FDA approved for NASAL specimens only), is one component of a comprehensive MRSA colonization surveillance program. It is not intended to diagnose MRSA infection nor to guide or monitor treatment for MRSA infections.     Radiology Reports Dg Chest 2 View  Result Date: 12/04/2016 CLINICAL DATA:  Generalize weakness for several days. History of CHF, coronary artery disease and previous MI and, COPD, former smoker. EXAM: CHEST  2 VIEW COMPARISON:  None in PACs FINDINGS: There is a moderate-sized right pleural effusion. There is no pleural effusion on the left. The left lung is well-expanded. Both lungs exhibit increased interstitial density. The cardiac silhouette is enlarged. The pulmonary vascularity is engorged. The patient has undergone previous median sternotomy and CABG. There is calcification in the wall of the aortic arch. The observed bony thorax is unremarkable. IMPRESSION: COPD. CHF with pulmonary interstitial edema. Moderate-sized right pleural effusion. There may be right basilar atelectasis or pneumonia. Thoracic aortic atherosclerosis. Electronically Signed   By: David  [place] M.D.   On: 11/18/2016 14:10   US Renal  Result Date: 11/25/2016 CLINICAL DATA:  Acute on chronic kidney injury. EXAM: RENAL / URINARY TRACT ULTRASOUND COMPLETE COMPARISON:  None. FINDINGS: Right Kidney: Length: 11.1 cm. Mildly increased echogenicity and moderate parenchymal thinning, consistent with medical renal disease.  No hydronephrosis. No suspicious focal parenchymal lesion. Left Kidney: Length: 11.0 cm. Mildly increased echogenicity and moderate parenchymal thinning, consistent with medical renal disease. No hydronephrosis. No suspicious focal parenchymal lesion. Bladder: Appears normal for degree of bladder distention. IMPRESSION: Atrophic appearing kidneys with increased echogenicity consistent with medical renal disease. No hydronephrosis. Electronically  Signed   By: Ellery Plunkaniel R Mitchell M.D.   On: 12-Sep-2016 23:17   Ir Fluoro Guide Cv Line Right  Result Date: 12/10/2016 INDICATION: 65 year old female with acute superimposed on chronic renal failure in need of hemodialysis and venous access. EXAM: IR RIGHT FLOURO GUIDE CV LINE; IR ULTRASOUND GUIDANCE VASC ACCESS RIGHT MEDICATIONS: None ANESTHESIA/SEDATION: None FLUOROSCOPY TIME:  Fluoroscopy Time: 0 minutes 18 seconds (6 mGy). COMPLICATIONS: None immediate. PROCEDURE: Informed written consent was obtained from the patient after a thorough discussion of the procedural risks, benefits and alternatives. All questions were addressed. Maximal Sterile Barrier Technique was utilized including caps, mask, sterile gowns, sterile gloves, sterile drape, hand hygiene and skin antiseptic. A timeout was performed prior to the initiation of the procedure. The right internal jugular vein was interrogated with ultrasound and found to be widely patent. An image was obtained and stored for the medical record. Local anesthesia was attained by infiltration with 1% lidocaine. A small dermatotomy was made. Under real-time sonographic guidance, the vessel was punctured with an 18 gauge needle. A 0.035 wire was then advanced through the needle and into the right heart. The skin tract was dilated and a 20 cm Mahurkar non tunneled hemodialysis catheter advanced over the wire. The catheter tip was positioned in the upper right atrium. The catheter was flushed with heparinized saline and secured to the skin with 0 Prolene suture. Sterile bandages were applied. IMPRESSION: Successful placement of a right IJ approach 20 cm Mahurkar non tunneled hemodialysis catheter with additional central venous access port. Catheter tips in the upper right atrium and ready for use. Signed, Sterling BigHeath K. McCullough, MD [facility] Electronically Signed   By: Malachy MoanHeath  McCullough M.D.   On: 12/10/2016 09:41    Ir Koreas Guide Vasc Access Right  Result Date: 12/10/2016 INDICATION: 65 year old female with acute superimposed on chronic renal failure in need of hemodialysis and venous access. EXAM: IR RIGHT FLOURO GUIDE CV LINE; IR ULTRASOUND GUIDANCE VASC ACCESS RIGHT MEDICATIONS: None ANESTHESIA/SEDATION: None FLUOROSCOPY TIME:  Fluoroscopy Time: 0 minutes 18 seconds (6 mGy). COMPLICATIONS: None immediate. PROCEDURE: Informed written consent was obtained from the patient after a thorough discussion of the procedural risks, benefits and alternatives. All questions were addressed. Maximal Sterile Barrier Technique was utilized including caps, mask, sterile gowns, sterile gloves, sterile drape, hand hygiene and skin antiseptic. A timeout was performed prior to the initiation of the procedure. The right internal jugular vein was interrogated with ultrasound and found to be widely patent. An image was obtained and stored for the medical record. Local anesthesia was attained by infiltration with 1% lidocaine. A small dermatotomy was made. Under real-time sonographic guidance, the vessel was punctured with an 18 gauge needle. A 0.035 wire was then advanced through the needle and into the right heart. The skin tract was dilated and a 20 cm Mahurkar non tunneled hemodialysis catheter advanced over the wire. The catheter tip was positioned in the upper right atrium. The catheter was flushed with heparinized saline and secured to the skin with 0 Prolene suture. Sterile bandages were applied. IMPRESSION: Successful placement of a right IJ approach 20 cm Mahurkar non tunneled hemodialysis catheter  with additional central venous access port. Catheter tips in the upper right atrium and ready for use. Signed, Sterling Big, MD [facility] Electronically Signed   By: Malachy Moan M.D.   On: 12/10/2016 09:41   Dg Chest Port 1 View  Result Date: 12/12/2016 CLINICAL  DATA:  Hypoxia EXAM: PORTABLE CHEST 1 VIEW COMPARISON:  01/01/2017 CXR FINDINGS: Interval increase in right effusion some which appears loculated along the periphery of the right hemithorax. Adjacent compressive atelectasis is noted at on the right. Superimposed pneumonia cannot be entirely excluded. Right heart border is obscured by the pleural effusion. The patient is status post CABG. Aortic atherosclerosis is noted. IMPRESSION: Interval increase in right effusion with loculated appearance along the lateral aspect of the right hemithorax. More confluent airspace opacity noted of the right hemithorax may represent compressive atelectasis. Pneumonia is not entirely excluded. Electronically Signed   By: Tollie Eth M.D.   On: 12/12/2016 02:44      Tarra Pence M.D on 12/12/2016 at 2:43 PM  Between 7am to 7pm - Pager - [phone]  After 7pm go to [email or web address] - password [email or web address]  [facility] -  Office  [phone]

## 2016-12-12 NOTE — Progress Notes (Signed)
Call received  At 0300 per [facility] NP K. Schorr regarding Pt with worsening AMS and increased 02 need. Pt seen at bedside at 0310. Pt found somnolent in bed. Moans occasionally and seen moving upper extremities weekly but does not withdraw to pain. No response to sternal rub. Rhonchi heard in upper lobes and upper airway. Pt does not have a gag reflex. No additional pain medications or sedating medications given since earlier tonight before Narcan. ABG completed and Patient did not move at all. Merdis Delay NP called and updated on PT status. ABG results 7.06/102/88.4/27.7 provided to NP. Goals of care discussed with family at bedside with Provider. Pt appears comfortable at this time. No additional interventions for now. RN to call if needed.

## 2016-12-12 NOTE — Progress Notes (Signed)
Patient very restless and groaning. Received a one time order for Dilaudid 1 mg IV since patient's pain was not controlled. After administration- Patient's oxygen level decreased to 60s. Became unresponsive. Would not respond to a sternal rub. Placed on non-rebreather. Gave IV Narcan 0.4 mg. NP Schorr aware. RR nurse in room. Patient woke up after Narcan administration. Stated she was uncomfortable. Blood sugar done 87. BP 137/70, HR 96, RR 15, Oxygen level 100 on non-rebreather changed to nasal canula 4 liters. NP Schorr added percocet 5-325 mg PO once if patient has more pain. Husband in room. Updated about patient's status. Currently resting, O2 95 on 4 liters nasal canula. Will continue to closely monitor.

## 2016-12-12 NOTE — Progress Notes (Signed)
Daily Progress Note   Patient Name: Cassandra Fisher       Date: 12/12/2016 DOB: Sep 29, 1951  Age: 65 y.o. MRN#: [id number] Attending Physician: Albertine Patricia, MD Primary Care Physician: Arnette Norris, MD Admit Date: 12/02/2016  Reason for Consultation/Follow-up: Establishing goals of care, Pain control and Psychosocial/spiritual support  Subjective: Cassandra Fisher became unresponsive overnight. Rapid Response nurse responded and spoke with family, who decided to pursue comfort care rather then proceed with aggressive interventions. This morning, Cassandra Fisher remains unresponsive. Her family reports that occasionally she will open her eyes to loud sound, but is otherwise asleep in bed. She has NRB on, with loud rhonchorous breathing and tachypnea.   Length of Stay: 6  Current Medications: Scheduled Meds:  . glycopyrrolate  0.1 mg Intravenous Once  . mouth rinse  15 mL Mouth Rinse BID  . naloxone      . sodium chloride flush  3 mL Intravenous Q12H    Continuous Infusions: . HYDROmorphone      PRN Meds: sodium chloride, albuterol, antiseptic oral rinse, diphenhydrAMINE, [DISCONTINUED] glycopyrrolate **OR** [DISCONTINUED] glycopyrrolate **OR** glycopyrrolate, HYDROmorphone **AND** HYDROmorphone, lidocaine (PF), [DISCONTINUED] LORazepam **OR** [DISCONTINUED] LORazepam **OR** LORazepam, ondansetron (ZOFRAN) IV, polyvinyl alcohol, sodium chloride flush  Physical Exam  Constitutional: She has a sickly appearance. Face mask in place.  HENT:  Head: Normocephalic and atraumatic.  Cardiovascular: Tachycardia present.   Pulmonary/Chest: Accessory muscle usage present. Tachypnea noted. She has rhonchi (loud and audible without auscultation).  Abdominal: Soft. She exhibits distension.  Neurological: She is unresponsive.  Skin: Skin is warm and dry.  Bruising noted. There is pallor.  Marked generalized dependent edema    Vital Signs: BP (!) 119/56   Pulse 86   Temp 97.6 F (36.4 C) (Oral)   Resp 14   Ht 5' 7"  (1.702 m)   Wt 123.7 kg (272 lb 11.3 oz)   SpO2 95%   BMI 42.71 kg/m  SpO2: SpO2: 95 % O2 Device: O2 Device: NRB O2 Flow Rate: O2 Flow Rate (L/min): 15 L/min  Intake/output summary:   Intake/Output Summary (Last 24 hours) at 12/12/16 4259 Last data filed at 12/11/16 2126  Gross per 24 hour  Intake               62 ml  Output             2125 ml  Net            -2063 ml   LBM: Last BM Date: 12/07/16 Baseline Weight: Weight: 113.4 kg (250 lb) Most recent weight: Weight: 123.7 kg (272 lb 11.3 oz)       Palliative Assessment/Data: PPS 10%   Flowsheet Rows   Flowsheet Row Most Recent Value  Intake Tab  Clinical Assessment  Psychosocial & Spiritual Assessment  Palliative Care Outcomes  Patient/Family meeting held?  Yes  Who was at the meeting?  Cassandra Fisher, Cassandra Fisher, Cassandra Fisher, multiple granddaughters  Palliative Care Outcomes  Clarified goals of care, Provided psychosocial or spiritual support, Changed CPR status, Completed durable DNR  Patient/Family wishes: Interventions discontinued/not started   -- [Cassandra Fisher would like to start dialysis]      Patient Active Problem List   Diagnosis Date Noted  .  Lethargy   . Palliative care by specialist   . Goals of care, counseling/discussion   . Elevated troponin 11/25/2016  . Acute renal failure superimposed on stage 3 chronic kidney disease ([place]) 11/29/2016  . DM (diabetes mellitus) ([place])   . Type 2 diabetes mellitus with hyperglycemia ([place]) 06/27/2014  . Cardiomyopathy ([place]) 05/25/2014  . CKD (chronic kidney disease) stage 3, GFR 30-59 ml/min 05/25/2014  . Vitamin D deficiency 04/03/2014  . Gout 04/03/2014  . Hyperkalemia 03/25/2014  . Arthralgia 12/22/2013  . Elevated sed rate 09/09/2013  . Carotid stenosis 04/21/2013  . Hx of CABG   . Cerebrovascular disease 01/27/2011   . Diabetes mellitus with renal complications ([place]) 14/78/2956  . HLD (hyperlipidemia) 11/27/2010  . Obesity 11/27/2010  . Anxiety state 11/27/2010  . Essential hypertension 11/27/2010  . MI 11/27/2010  . Coronary atherosclerosis 11/27/2010  . ATRIAL FIBRILLATION 11/27/2010  . COPD with exacerbation ([place]) 11/27/2010  . Backache 11/27/2010    Palliative Care Assessment & Plan   HPI: 65 y.o. female  with past medical history of CAD, CHF with CABG in 2003, HTN, A. FIB, COPD, and type 2 diabetes who was admitted on 11/30/2016 with weakness. Workup showed acute renal failure, hyperkalemia, new LBBB, and significant volume overload with CXR showing CHF with pulmonary interstitial edema, moderate right pleural effusion, and possible right basilar atelectasis vs PNA. Nephrology was consulted and felt ATN secondary to low blood pressure from Vasotec in the setting of diabetic nephropathy. Both Nephrology and Cardiology have advised dialysis (likely short term) for volume management.  Overnight on 12/28-12/29 she became unresponsive. Family elected to pursue full comfort care.  Assessment: Cassandra Fisher has been transitioned to full comfort care. I met with her family at her bedside and explained that comfort care meant looking at her and focusing our care interventions on how she seemed. I am not concerned about lab values or vital signs, but rather base my interventions on managing her symptoms and ensuring she remains comfortable without pain or suffering. They were fully in support of this approach.  We did talk about location for her passing. Given her acute decline with marked work of breathing and known lab abnormalities, I would be concerned she could not tolerate transition out of the hospital. I suspect she will pass in less than 48 hours and will consequently keep her here. Her family agrees with this, as they are very concerned that she could die in transit.   Recommendations/Plan:  DNR,  Comfort care only: no lab draws, VS once once daily, no titration of O2  Will start dilaudid infusion with bolus orders; directions written in order and explained to care nurse on drip titration  Once dose of Robinul ordered, and PRN available (use encouraged with care nurse)  Will stop all continuous cardiac and O2 monitoring, and will place Cassandra Fisher on low flow face mask with no O2 increase regardless of O2 saturation  Code Status:  DNR  Prognosis:   Hours - Days  Discharge Planning:  Anticipated Hospital Death  Care plan was discussed with Cassandra Fisher, Cassandra Fisher's Cassandra Fisher and family, care nurse  Thank you for allowing the Palliative Medicine Team to assist in the care of this patient.   Time In: 0845 Time Out: 0945 Total Time 60 minutes Prolonged Time Billed  yes      Greater than 50%  of this time was spent counseling and coordinating care related to the above assessment and plan.  Charlynn Court, NP Palliative Medicine Team [phone] (  cell phone 7a-5p) [phone] pager (7a-5p) Team Phone # [phone]

## 2016-12-13 DIAGNOSIS — E0821 Diabetes mellitus due to underlying condition with diabetic nephropathy: Secondary | ICD-10-CM

## 2016-12-13 DIAGNOSIS — Z515 Encounter for palliative care: Secondary | ICD-10-CM

## 2016-12-13 DIAGNOSIS — J9622 Acute and chronic respiratory failure with hypercapnia: Secondary | ICD-10-CM

## 2016-12-13 DIAGNOSIS — J9621 Acute and chronic respiratory failure with hypoxia: Secondary | ICD-10-CM

## 2016-12-13 MED ORDER — GLYCOPYRROLATE 0.2 MG/ML IJ SOLN
0.4000 mg | INTRAMUSCULAR | Status: DC
  Administered 2016-12-13: 0.4 mg via INTRAVENOUS
  Filled 2016-12-13: qty 2

## 2016-12-15 NOTE — Progress Notes (Signed)
PROGRESS NOTE                                                                                                                                                                                                             Patient Demographics:    Cassandra Fisher, is a 66 y.o. female, DOB - 1951-07-08, BMS:[id number]  Admit date - 11/29/2016   Admitting Physician Haydee Salter, MD  Outpatient Primary MD for the patient is [name], MD  LOS - 7   Chief Complaint  Patient presents with  . Cough    pt has had a cough for the past three weeks increasing cough for the past 3 days        Brief Narrative   66 y.o. female with a Past Medical History negative for chronic kidney disease, diabetes, hypertension and CHF who presented with weakness, and acute renal failure with hyperkalemia and volume overload. Potassium> 7.5, with new LBBB. Patient Initially refused hemodialysis, hyperkalemia corrected with medications. She was found to have significant uremia, elevated BUN and volume overload, started dialysis on 12/27. With worsening mental status and respiratory failure, workup significant for CO2 retention, seen by palliative medicine, patient and family want to stop dialysis, and plan is for comfort.   Subjective:   Patient is lethargic. Does not respond to voice. Family is present at bedside.   Assessment  & Plan :    Principal Problem:   Acute renal failure superimposed on stage 3 chronic kidney disease (HCC) Active Problems:   Diabetes mellitus with renal complications (HCC)   HLD (hyperlipidemia)   Anxiety state   Essential hypertension   COPD with exacerbation (HCC)   Hyperkalemia   Gout   Cardiomyopathy (HCC)   CKD (chronic kidney disease) stage 3, GFR 30-59 ml/min   Elevated troponin   Goals of care, counseling/discussion   Palliative care by specialist   Lethargy   Comfort measures only status   End of life care   Acute on chronic  respiratory failure with hypoxia and hypercapnia (HCC)   Acute hypoxic/hypercapnic respiratory failure - Multifactorial, secondary to volume overload, CHF and COPD - With significant deterioration of clinical status, ABG with pH of 7.06, PCO2 of 102 - At this point family does not want to pursue any aggressive measures, their main goal is comfort, seen by palliative  medicine.  Acute renal failure and CKD stage III - Baseline creatinine 1.5, with worsening creatinine in the setting of ATN secondary to low blood pressure on vasotec in the setting of diabetic nephropathy. -no significant improvement with volume status with  diuresis, with significantly elevated BUN , patient finally agreeing to hemodialysis, temporary dialysis catheter inserted 12/27, and started on hemodialysis 12/27. - Currently comfort care, no further dialysis  Hyperkalemia - Secondary to renal failure, resolved with medical management  Left bundle branch block - due to hyperkalemia, new onset,   COPD - She is comfort care  Acute on chronic systolic heart failure - Most recent echo in 2011 EF 45%, repeat 2-D echo with EF 40%, severe hypokinesis of the mid anterior septal myocardium.  Elevated troponin - Likely demand ischemia in the setting of renal failure, hypoxia and respiratory distress, upon a trend is non-ACS pattern 0.03> 0.04> 0.04 - Her left bundle branch block related to hyperkalemia  Diabetes mellitus. - hemoglobin A1c 7.1   Code Status : DO NOT RESUSCITATE/comfort care  Family Communication  : D/W husband, daughter and sister at bedside.  Disposition Plan  : She may not survive this hospitalization  Consults  :  Renal, cardiology, palliative medicine  Procedures  : Temporary dialysis catheter by IR 12/27  DVT Prophylaxis  :  comfort  Lab Results  Component Value Date   PLT 111 (L) 12/11/2016    Antibiotics  :   Anti-infectives    None        Objective:   Vitals:   12/11/16 2305  12/12/16 0020 12/12/16 0158 12/12/16 0453  BP: 137/70 (!) 95/30 108/60 (!) 119/56  Pulse: 96 (!) 43 93 86  Resp: (!) 22 19 13 14   Temp:  98.6 F (37 C) 97.6 F (36.4 C)   TempSrc:  Oral Oral   SpO2: 100% 95% 91% 95%  Weight:      Height:        Wt Readings from Last 3 Encounters:  12/11/16 123.7 kg (272 lb 11.3 oz)  07/17/16 109.8 kg (242 lb)  06/03/16 112.3 kg (247 lb 8 oz)     Intake/Output Summary (Last 24 hours) at 12/04/2016 1154 Last data filed at 11/18/2016 0600  Gross per 24 hour  Intake              125 ml  Output                0 ml  Net              125 ml     Physical Exam  Unresponsive Coarse breath sounds bilaterally. Upper airway sounds present. Not fully examined due to comfort care.   Data Review:    CBC  Recent Labs Lab 12/07/16 0159 12/08/16 0320 12/09/16 0413 12/10/16 1210 12/11/16 0256  WBC 5.7 7.3 6.8 6.7 7.5  HGB 9.3* 9.5* 8.8* 9.2* 9.2*  HCT 31.0* 31.4* 28.3* 29.7* 30.2*  PLT 112* 111* 99* 106* 111*  MCV 86.6 86.3 84.0 83.9 85.8  MCH 26.0 26.1 26.1 26.0 26.1  MCHC 30.0 30.3 31.1 31.0 30.5  RDW 16.4* 16.4* 16.6* 16.4* 16.6*    Chemistries   Recent Labs Lab 12/07/16 0159 12/08/16 0320 12/09/16 0413 12/10/16 0542 12/11/16 0256  NA 134* 132* 132* 132* 131*  K 4.5 4.2 4.0 3.7 4.0  CL 98* 96* 95* 95* 94*  CO2 24 25 24 26 26   GLUCOSE 207* 121* 125* 110* 102*  BUN 153*  158* 158* 169* 131*  CREATININE 2.77* 3.34* 3.60* 3.16* 2.93*  CALCIUM 8.6* 8.3* 8.2* 8.2* 8.3*    Lab Results  Component Value Date   HGBA1C 7.1 (H) 12/08/2016       Component Value Date/Time   BNP 991.2 (H) 11/19/2016 1306    Inpatient Medications  Scheduled Meds: . mouth rinse  15 mL Mouth Rinse BID  . sodium chloride flush  3 mL Intravenous Q12H   Continuous Infusions: . HYDROmorphone 0.2 mg/hr (12/12/16 0952)   PRN Meds:.sodium chloride, albuterol, antiseptic oral rinse, diphenhydrAMINE, [DISCONTINUED] glycopyrrolate **OR** [DISCONTINUED]  glycopyrrolate **OR** glycopyrrolate, HYDROmorphone **AND** HYDROmorphone, lidocaine (PF), [DISCONTINUED] LORazepam **OR** [DISCONTINUED] LORazepam **OR** LORazepam, ondansetron (ZOFRAN) IV, polyvinyl alcohol, sodium chloride flush  Micro Results Recent Results (from the past 240 hour(s))  MRSA PCR Screening     Status: None   Collection Time: 12/06/16 11:33 AM  Result Value Ref Range Status   MRSA by PCR NEGATIVE NEGATIVE Final    Comment:        The GeneXpert MRSA Assay (FDA approved for NASAL specimens only), is one component of a comprehensive MRSA colonization surveillance program. It is not intended to diagnose MRSA infection nor to guide or monitor treatment for MRSA infections.     Radiology Reports Dg Chest 2 View  Result Date: 11/28/2016 CLINICAL DATA:  Generalize weakness for several days. History of CHF, coronary artery disease and previous MI and, COPD, former smoker. EXAM: CHEST  2 VIEW COMPARISON:  None in PACs FINDINGS: There is a moderate-sized right pleural effusion. There is no pleural effusion on the left. The left lung is well-expanded. Both lungs exhibit increased interstitial density. The cardiac silhouette is enlarged. The pulmonary vascularity is engorged. The patient has undergone previous median sternotomy and CABG. There is calcification in the wall of the aortic arch. The observed bony thorax is unremarkable. IMPRESSION: COPD. CHF with pulmonary interstitial edema. Moderate-sized right pleural effusion. There may be right basilar atelectasis or pneumonia. Thoracic aortic atherosclerosis. Electronically Signed   By: David  [place] M.D.   On: 12/07/2016 14:10   US Renal  Result Date: 12/02/2016 CLINICAL DATA:  Acute on chronic kidney injury. EXAM: RENAL / URINARY TRACT ULTRASOUND COMPLETE COMPARISON:  None. FINDINGS: Right Kidney: Length: 11.1 cm. Mildly increased echogenicity and moderate parenchymal thinning, consistent with medical renal disease. No  hydronephrosis. No suspicious focal parenchymal lesion. Left Kidney: Length: 11.0 cm. Mildly increased echogenicity and moderate parenchymal thinning, consistent with medical renal disease. No hydronephrosis. No suspicious focal parenchymal lesion. Bladder: Appears normal for degree of bladder distention. IMPRESSION: Atrophic appearing kidneys with increased echogenicity consistent with medical renal disease. No hydronephrosis. Electronically Signed   By: Ellery Plunk M.D.   On: 12/08/2016 23:17   Ir Fluoro Guide Cv Line Right  Result Date: 12/10/2016 INDICATION: 66 year old female with acute superimposed on chronic renal failure in need of hemodialysis and venous access. EXAM: IR RIGHT FLOURO GUIDE CV LINE; IR ULTRASOUND GUIDANCE VASC ACCESS RIGHT MEDICATIONS: None ANESTHESIA/SEDATION: None FLUOROSCOPY TIME:  Fluoroscopy Time: 0 minutes 18 seconds (6 mGy). COMPLICATIONS: None immediate. PROCEDURE: Informed written consent was obtained from the patient after a thorough discussion of the procedural risks, benefits and alternatives. All questions were addressed. Maximal Sterile Barrier Technique was utilized including caps, mask, sterile gowns, sterile gloves, sterile drape, hand hygiene and skin antiseptic. A timeout was performed prior to the initiation of the procedure. The right internal jugular vein was interrogated with ultrasound and found to be widely patent. An image  was obtained and stored for the medical record. Local anesthesia was attained by infiltration with 1% lidocaine. A small dermatotomy was made. Under real-time sonographic guidance, the vessel was punctured with an 18 gauge needle. A 0.035 wire was then advanced through the needle and into the right heart. The skin tract was dilated and a 20 cm Mahurkar non tunneled hemodialysis catheter advanced over the wire. The catheter tip was positioned in the upper right atrium. The catheter was flushed with heparinized saline and secured to the  skin with 0 Prolene suture. Sterile bandages were applied. IMPRESSION: Successful placement of a right IJ approach 20 cm Mahurkar non tunneled hemodialysis catheter with additional central venous access port. Catheter tips in the upper right atrium and ready for use. Signed, Sterling Big, MD [facility] Radiology Electronically Signed   By: Malachy Moan M.D.   On: 12/10/2016 09:41   Ir US Guide Vasc Access Right  Result Date: 12/10/2016 INDICATION: 66 year old female with acute superimposed on chronic renal failure in need of hemodialysis and venous access. EXAM: IR RIGHT FLOURO GUIDE CV LINE; IR ULTRASOUND GUIDANCE VASC ACCESS RIGHT MEDICATIONS: None ANESTHESIA/SEDATION: None FLUOROSCOPY TIME:  Fluoroscopy Time: 0 minutes 18 seconds (6 mGy). COMPLICATIONS: None immediate. PROCEDURE: Informed written consent was obtained from the patient after a thorough discussion of the procedural risks, benefits and alternatives. All questions were addressed. Maximal Sterile Barrier Technique was utilized including caps, mask, sterile gowns, sterile gloves, sterile drape, hand hygiene and skin antiseptic. A timeout was performed prior to the initiation of the procedure. The right internal jugular vein was interrogated with ultrasound and found to be widely patent. An image was obtained and stored for the medical record. Local anesthesia was attained by infiltration with 1% lidocaine. A small dermatotomy was made. Under real-time sonographic guidance, the vessel was punctured with an 18 gauge needle. A 0.035 wire was then advanced through the needle and into the right heart. The skin tract was dilated and a 20 cm Mahurkar non tunneled hemodialysis catheter advanced over the wire. The catheter tip was positioned in the upper right atrium. The catheter was flushed with heparinized saline and secured to the skin with 0 Prolene suture. Sterile bandages were applied.  IMPRESSION: Successful placement of a right IJ approach 20 cm Mahurkar non tunneled hemodialysis catheter with additional central venous access port. Catheter tips in the upper right atrium and ready for use. Signed, Sterling Big, MD [facility] Radiology Electronically Signed   By: Malachy Moan M.D.   On: 12/10/2016 09:41   Dg Chest Port 1 View  Result Date: 12/12/2016 CLINICAL DATA:  Hypoxia EXAM: PORTABLE CHEST 1 VIEW COMPARISON:  12/02/2016 CXR FINDINGS: Interval increase in right effusion some which appears loculated along the periphery of the right hemithorax. Adjacent compressive atelectasis is noted at on the right. Superimposed pneumonia cannot be entirely excluded. Right heart border is obscured by the pleural effusion. The patient is status post CABG. Aortic atherosclerosis is noted. IMPRESSION: Interval increase in right effusion with loculated appearance along the lateral aspect of the right hemithorax. More confluent airspace opacity noted of the right hemithorax may represent compressive atelectasis. Pneumonia is not entirely excluded. Electronically Signed   By: Tollie Eth M.D.   On: 12/12/2016 02:44      Teleshia Lemere M.D on 01/08/2017 at 11:54 AM  Between 7am to 7pm - Pager - [phone]  After 7pm go to [email or web address] - password [email or web address]  [facility] -  Office  [phone]

## 2016-12-15 NOTE — Discharge Summary (Signed)
Death Summary  Cassandra Fisher:[id number] DOB: 1950-12-22 DOA: 12/18/2016  PCP: [name], MD PCP/Office notified: via epic  Admit date: 2016-12-18 Date of Death: 2016-12-27  Final Diagnoses:  Principal Problem:   Acute renal failure superimposed on stage 3 chronic kidney disease (HCC) Active Problems:   Diabetes mellitus with renal complications (HCC)   HLD (hyperlipidemia)   Anxiety state   Essential hypertension   COPD with exacerbation (HCC)   Hyperkalemia   Gout   Cardiomyopathy (HCC)   CKD (chronic kidney disease) stage 3, GFR 30-59 ml/min   Elevated troponin   Goals of care, counseling/discussion   Palliative care by specialist   Lethargy   Comfort measures only status   End of life care   Acute on chronic respiratory failure with hypoxia and hypercapnia (HCC)   Terminal care   Palliative care encounter    History of present illness And brief course: 66 y.o.femalewith a Past Medical History negative for chronic kidney disease, diabetes, hypertension and CHF who presented with weakness, and acute renal failure with hyperkalemia and volume overload. Potassium> 7.5, with new LBBB. Patient Initially refused hemodialysis, hyperkalemia corrected with medications. She was found to have significant uremia, elevated BUN and volume overload, started dialysis on 12/27. With worsening mental status and respiratory failure, workup significant for CO2 retention, seen by palliative medicine, patient and family want to stop dialysis, and plan is for comfort.   Hospital Course:  Acute hypoxic/hypercapnic respiratory failure Her presentation was thought to be multifactorial including volume overload, CHF and COPD. volume overload was thought to be secondary to acute renal failure on chronic kidney disease. Patient did not improve with diuretics. She required dialysis. However, her clinical status continued to get worse and she developed significant respiratory acidosis. At which  point family decided to stop all aggressive measures including dialysis.  Acute renal failure and CKD stage III Baseline creatinine 1.5, with worsening creatinine in the setting of ATN secondary to low blood pressure on vasotec in the setting of diabetic nephropathy. No significant improvement with volume status with  diuresis, with significantly elevated BUN. Patient finally agreed to hemodialysis. Temporary dialysis catheter inserted 12/27, and started on hemodialysis 12/27. Subsequently, patient worsened. Dialysis was discontinued. Comfort care was initiated.  Hyperkalemia Secondary to renal failure, resolved with medical management.  Left bundle branch block Due to hyperkalemia.  COPD Improved. She was transitioned to comfort care.   Acute on chronic systolic heart failure Most recent echo in 2011 EF 45%, repeat 2-D echo with EF 40%, severe hypokinesis of the mid anterior septal myocardium. Volume overloaded as discussed above. Was started on dialysis initially.  Elevated troponin Likely secondary to demand ischemia in the setting of renal failure, hypoxia and respiratory distress. Her left bundle branch block related to hyperkalemia.  Diabetes mellitus. Hemoglobin A1c 7.1   Due to continued deterioration patient was transitioned to comfort care after discussions with family. Palliative medicine team was involved. Patient subsequently expired on 26-Dec-2016 at 5:58 PM. Acute renal failure would be considered the main cause of death.    The results of significant diagnostics from this hospitalization (including imaging, microbiology, ancillary and laboratory) are listed below for reference.    Significant Diagnostic Studies: Dg Chest 2 View  Result Date: 18-Dec-2016 CLINICAL DATA:  Generalize weakness for several days. History of CHF, coronary artery disease and previous MI and, COPD, former smoker. EXAM: CHEST  2 VIEW COMPARISON:  None in PACs FINDINGS: There is a  moderate-sized right pleural  effusion. There is no pleural effusion on the left. The left lung is well-expanded. Both lungs exhibit increased interstitial density. The cardiac silhouette is enlarged. The pulmonary vascularity is engorged. The patient has undergone previous median sternotomy and CABG. There is calcification in the wall of the aortic arch. The observed bony thorax is unremarkable. IMPRESSION: COPD. CHF with pulmonary interstitial edema. Moderate-sized right pleural effusion. There may be right basilar atelectasis or pneumonia. Thoracic aortic atherosclerosis. Electronically Signed   By: David  SwazilandJordan M.D.   On: 12/08/2016 14:10   [place] Renal  Result Date: 11/18/2016 CLINICAL DATA:  Acute on chronic kidney injury. EXAM: RENAL / URINARY TRACT ULTRASOUND COMPLETE COMPARISON:  None. FINDINGS: Right Kidney: Length: 11.1 cm. Mildly increased echogenicity and moderate parenchymal thinning, consistent with medical renal disease. No hydronephrosis. No suspicious focal parenchymal lesion. Left Kidney: Length: 11.0 cm. Mildly increased echogenicity and moderate parenchymal thinning, consistent with medical renal disease. No hydronephrosis. No suspicious focal parenchymal lesion. Bladder: Appears normal for degree of bladder distention. IMPRESSION: Atrophic appearing kidneys with increased echogenicity consistent with medical renal disease. No hydronephrosis. Electronically Signed   By: Ellery Plunkaniel R Mitchell M.D.   On: 12/04/2016 23:17   Ir Fluoro Guide Cv Line Right  Result Date: 12/10/2016 INDICATION: 66 year old female with acute superimposed on chronic renal failure in need of hemodialysis and venous access. EXAM: IR RIGHT FLOURO GUIDE CV LINE; IR ULTRASOUND GUIDANCE VASC ACCESS RIGHT MEDICATIONS: None ANESTHESIA/SEDATION: None FLUOROSCOPY TIME:  Fluoroscopy Time: 0 minutes 18 seconds (6 mGy). COMPLICATIONS: None immediate. PROCEDURE: Informed written consent was obtained from the patient after a thorough  discussion of the procedural risks, benefits and alternatives. All questions were addressed. Maximal Sterile Barrier Technique was utilized including caps, mask, sterile gowns, sterile gloves, sterile drape, hand hygiene and skin antiseptic. A timeout was performed prior to the initiation of the procedure. The right internal jugular vein was interrogated with ultrasound and found to be widely patent. An image was obtained and stored for the medical record. Local anesthesia was attained by infiltration with 1% lidocaine. A small dermatotomy was made. Under real-time sonographic guidance, the vessel was punctured with an 18 gauge needle. A 0.035 wire was then advanced through the needle and into the right heart. The skin tract was dilated and a 20 cm Mahurkar non tunneled hemodialysis catheter advanced over the wire. The catheter tip was positioned in the upper right atrium. The catheter was flushed with heparinized saline and secured to the skin with 0 Prolene suture. Sterile bandages were applied. IMPRESSION: Successful placement of a right IJ approach 20 cm Mahurkar non tunneled hemodialysis catheter with additional central venous access port. Catheter tips in the upper right atrium and ready for use. Signed, Sterling BigHeath K. McCullough, MD [facility] Radiology Electronically Signed   By: Malachy MoanHeath  McCullough M.D.   On: 12/10/2016 09:41   Ir [place]as Guide Vasc Access Right  Result Date: 12/10/2016 INDICATION: 66 year old female with acute superimposed on chronic renal failure in need of hemodialysis and venous access. EXAM: IR RIGHT FLOURO GUIDE CV LINE; IR ULTRASOUND GUIDANCE VASC ACCESS RIGHT MEDICATIONS: None ANESTHESIA/SEDATION: None FLUOROSCOPY TIME:  Fluoroscopy Time: 0 minutes 18 seconds (6 mGy). COMPLICATIONS: None immediate. PROCEDURE: Informed written consent was obtained from the patient after a thorough discussion of the procedural risks, benefits and alternatives.  All questions were addressed. Maximal Sterile Barrier Technique was utilized including caps, mask, sterile gowns, sterile gloves, sterile drape, hand hygiene and skin antiseptic. A timeout was performed prior  to the initiation of the procedure. The right internal jugular vein was interrogated with ultrasound and found to be widely patent. An image was obtained and stored for the medical record. Local anesthesia was attained by infiltration with 1% lidocaine. A small dermatotomy was made. Under real-time sonographic guidance, the vessel was punctured with an 18 gauge needle. A 0.035 wire was then advanced through the needle and into the right heart. The skin tract was dilated and a 20 cm Mahurkar non tunneled hemodialysis catheter advanced over the wire. The catheter tip was positioned in the upper right atrium. The catheter was flushed with heparinized saline and secured to the skin with 0 Prolene suture. Sterile bandages were applied. IMPRESSION: Successful placement of a right IJ approach 20 cm Mahurkar non tunneled hemodialysis catheter with additional central venous access port. Catheter tips in the upper right atrium and ready for use. Signed, Sterling Big, MD [facility]iology Electronically Signed   By: Malachy Moan M.D.   On: 12/10/2016 09:41   Dg Chest Port 1 View  Result Date: 12/12/2016 CLINICAL DATA:  Hypoxia EXAM: PORTABLE CHEST 1 VIEW COMPARISON:  December 15, 2016 CXR FINDINGS: Interval increase in right effusion some which appears loculated along the periphery of the right hemithorax. Adjacent compressive atelectasis is noted at on the right. Superimposed pneumonia cannot be entirely excluded. Right heart border is obscured by the pleural effusion. The patient is status post CABG. Aortic atherosclerosis is noted. IMPRESSION: Interval increase in right effusion with loculated appearance along the lateral aspect of the right hemithorax.  More confluent airspace opacity noted of the right hemithorax may represent compressive atelectasis. Pneumonia is not entirely excluded. Electronically Signed   By: Tollie Eth M.D.   On: 12/12/2016 02:44    Microbiology: Recent Results (from the past 240 hour(s))  MRSA PCR Screening     Status: None   Collection Time: 12/06/16 11:33 AM  Result Value Ref Range Status   MRSA by PCR NEGATIVE NEGATIVE Final    Comment:        The GeneXpert MRSA Assay (FDA approved for NASAL specimens only), is one component of a comprehensive MRSA colonization surveillance program. It is not intended to diagnose MRSA infection nor to guide or monitor treatment for MRSA infections.      Labs: Basic Metabolic Panel:  Recent Labs Lab 12/08/16 0320 12/09/16 0413 12/10/16 0542 12/11/16 0256  NA 132* 132* 132* 131*  K 4.2 4.0 3.7 4.0  CL 96* 95* 95* 94*  CO2 25 24 26 26   GLUCOSE 121* 125* 110* 102*  BUN 158* 158* 169* 131*  CREATININE 3.34* 3.60* 3.16* 2.93*  CALCIUM 8.3* 8.2* 8.2* 8.3*  PHOS 8.3*  --  8.5* 8.8*   Liver Function Tests:  Recent Labs Lab 12/08/16 0320 12/10/16 0542 12/11/16 0256  ALBUMIN 2.5* 2.4* 2.3*   CBC:  Recent Labs Lab 12/08/16 0320 12/09/16 0413 12/10/16 1210 12/11/16 0256  WBC 7.3 6.8 6.7 7.5  HGB 9.5* 8.8* 9.2* 9.2*  HCT 31.4* 28.3* 29.7* 30.2*  MCV 86.3 84.0 83.9 85.8  PLT 111* 99* 106* 111*   CBG:  Recent Labs Lab 12/11/16 1131 12/11/16 1640 12/11/16 2122 12/11/16 2321 12/12/16 0212  GLUCAP 137* 89 91 87 99   Urinalysis    Component Value Date/Time   COLORURINE YELLOW 12/06/2016 0142   APPEARANCEUR CLEAR 12/06/2016 0142   LABSPEC 1.010 12/06/2016 0142   PHURINE 5.0 12/06/2016 0142   GLUCOSEU NEGATIVE 12/06/2016 0142  HGBUR NEGATIVE 12/06/2016 0142   BILIRUBINUR NEGATIVE 12/06/2016 0142   BILIRUBINUR unable to read 03/09/2014 1411   KETONESUR NEGATIVE 12/06/2016 0142   PROTEINUR NEGATIVE 12/06/2016 0142   UROBILINOGEN 0.2  03/25/2014 1436   NITRITE NEGATIVE 12/06/2016 0142   LEUKOCYTESUR NEGATIVE 12/06/2016 0142     Saleha Kalp  [facility] 12/14/2016, 3:28 PM

## 2016-12-15 NOTE — Progress Notes (Signed)
Called to room by patients family, Patient not breathing, 2RN's myself and [facility] Listened for apical pulse. Patient pronounced dead at 1758. Charge Nurse notified, MD and patient placement. HD cath removed as well as PIV and Foley cath.

## 2016-12-15 NOTE — Progress Notes (Signed)
Daily Progress Note   Patient Name: Cassandra Fisher       Date: 2017-01-08 DOB: 1951-02-10  Age: 66 y.o. MRN#: [id number] Attending Physician: Cassandra Haff, MD Primary Care Physician: Cassandra Norris, MD Admit Date: 11/17/2016  Reason for Consultation/Follow-up: Establishing goals of care, Pain control and Psychosocial/spiritual support  Subjective: Family at bedside. We discussed progression at EOL. She is having significant gurgling and secretions - will titrate medication. She has been unresponsive today. Family is tearful but realistic. Offered emotional support. Discussed with RN.   Length of Stay: 7  Current Medications: Scheduled Meds:  . glycopyrrolate  0.4 mg Intravenous Q4H  . mouth rinse  15 mL Mouth Rinse BID  . sodium chloride flush  3 mL Intravenous Q12H    Continuous Infusions: . HYDROmorphone 0.2 mg/hr (12/12/16 0952)    PRN Meds: sodium chloride, albuterol, antiseptic oral rinse, diphenhydrAMINE, HYDROmorphone **AND** HYDROmorphone, lidocaine (PF), [DISCONTINUED] LORazepam **OR** [DISCONTINUED] LORazepam **OR** LORazepam, ondansetron (ZOFRAN) IV, polyvinyl alcohol, sodium chloride flush  Physical Exam  Constitutional: She has a sickly appearance. Face mask in place.  HENT:  Head: Normocephalic and atraumatic.  Cardiovascular: Tachycardia present.   Pulmonary/Chest: No accessory muscle usage. Apnea noted. No tachypnea. No respiratory distress. She has rhonchi.  Copious secretions and gurgling.   Abdominal: She exhibits distension.  Neurological: She is unresponsive.  Skin: Skin is warm and dry.    Vital Signs: BP (!) 119/56   Pulse 86   Temp 97.6 F (36.4 C) (Oral)   Resp 14   Ht 5' 7" (1.702 m)   Wt 123.7 kg (272 lb 11.3 oz)   SpO2 95%   BMI 42.71 kg/m  SpO2: SpO2: 95 % O2 Device: O2 Device: NRB O2 Flow  Rate: O2 Flow Rate (L/min): 15 L/min  Intake/output summary:   Intake/Output Summary (Last 24 hours) at 2017/01/08 1620 Last data filed at 2017/01/08 0600  Gross per 24 hour  Intake              125 ml  Output                0 ml  Net              125 ml   LBM: Last BM Date: 12/07/16 Baseline Weight: Weight: 113.4 kg (250 lb) Most recent weight: Weight: 123.7 kg (272 lb 11.3 oz)       Palliative Assessment/Data: PPS 10%   Flowsheet Rows   Flowsheet Row Most Recent Value  Intake Tab  Clinical Assessment  Psychosocial & Spiritual Assessment  Palliative Care Outcomes  Patient/Family meeting held?  Yes  Who was at the meeting?  Pt, husband, daughter, multiple granddaughters  Palliative Care Outcomes  Clarified goals of care, Provided psychosocial or spiritual support, Changed CPR status, Completed durable DNR  Patient/Family wishes: Interventions discontinued/not started   -- [Pt would like to start dialysis]      Patient Active Problem List   Diagnosis Date Noted  . Comfort measures only status   . End of life care   . Acute on chronic respiratory failure with hypoxia and hypercapnia (HCC)   . Lethargy   . Palliative care by specialist   .  Goals of care, counseling/discussion   . Elevated troponin 11/20/2016  . Acute renal failure superimposed on stage 3 chronic kidney disease ([place]) 11/21/2016  . DM (diabetes mellitus) ([place])   . Type 2 diabetes mellitus with hyperglycemia ([place]) 06/27/2014  . Cardiomyopathy ([place]) 05/25/2014  . CKD (chronic kidney disease) stage 3, GFR 30-59 ml/min 05/25/2014  . Vitamin D deficiency 04/03/2014  . Gout 04/03/2014  . Hyperkalemia 03/25/2014  . Arthralgia 12/22/2013  . Elevated sed rate 09/09/2013  . Carotid stenosis 04/21/2013  . Hx of CABG   . Cerebrovascular disease 01/27/2011  . Diabetes mellitus with renal complications ([place]) 13/24/4010  . HLD (hyperlipidemia) 11/27/2010  . Obesity 11/27/2010  . Anxiety state 11/27/2010  .  Essential hypertension 11/27/2010  . MI 11/27/2010  . Coronary atherosclerosis 11/27/2010  . ATRIAL FIBRILLATION 11/27/2010  . COPD with exacerbation ([place]) 11/27/2010  . Backache 11/27/2010    Palliative Care Assessment & Plan   HPI: 66 y.o. female  with past medical history of CAD, CHF with CABG in 2003, HTN, A. FIB, COPD, and type 2 diabetes who was admitted on 12/02/2016 with weakness. Workup showed acute renal failure, hyperkalemia, new LBBB, and significant volume overload with CXR showing CHF with pulmonary interstitial edema, moderate right pleural effusion, and possible right basilar atelectasis vs PNA. Nephrology was consulted and felt ATN secondary to low blood pressure from Vasotec in the setting of diabetic nephropathy. Both Nephrology and Cardiology have advised dialysis (likely short term) for volume management.  Overnight on 12/28-12/29 she became unresponsive. Family elected to pursue full comfort care.  Assessment: [name] has been transitioned to full comfort care. I met with her family at her bedside and explained that comfort care meant looking at her and focusing our care interventions on how she seemed. I am not concerned about lab values or vital signs, but rather base my interventions on managing her symptoms and ensuring she remains comfortable without pain or suffering. They were fully in support of this approach.  We did talk about location for her passing. Given her acute decline with marked work of breathing and known lab abnormalities, I would be concerned she could not tolerate transition out of the hospital. I suspect she will pass in less than 48 hours and will consequently keep her here. Her family agrees with this, as they are very concerned that she could die in transit.   Recommendations/Plan:  DNR, Comfort care only: no lab draws, VS once once daily, no titration of O2  Will start dilaudid infusion with bolus orders; directions written in order and  explained to care nurse on drip titration  Once dose of Robinul ordered, and PRN available (use encouraged with care nurse)  Will stop all continuous cardiac and O2 monitoring, and will place pt on low flow face mask with no O2 increase regardless of O2 saturation  Code Status:  DNR  Prognosis:   Likely hours  Discharge Planning:  Anticipated Hospital Death  Care plan was discussed with pt, pt's husband and family, care nurse  Thank you for allowing the Palliative Medicine Team to assist in the care of this patient.   Time In: 1540 Time Out: 1600 Total Time 20 minutes Prolonged Time Billed  no      Greater than 50%  of this time was spent counseling and coordinating care related to the above assessment and plan.  Vinie Sill, NP Palliative Medicine Team Pager # [phone] (M-F 8a-5p) Team Phone # [phone] (Nights/Weekends)

## 2016-12-15 DEATH — deceased

## 2016-12-22 ENCOUNTER — Encounter: Payer: PPO | Admitting: Family Medicine

## 2016-12-29 ENCOUNTER — Encounter (HOSPITAL_COMMUNITY): Payer: PPO

## 2017-08-07 IMAGING — DX DG CHEST 2V
2 series · 2 of 2 positions shown · non-contrast
Comparison: None in PACs

CLINICAL DATA: Generalize weakness for several days. History of
CHF, coronary artery disease and previous MI and, COPD, former
smoker.

EXAM:
CHEST  2 VIEW

[chest lat]
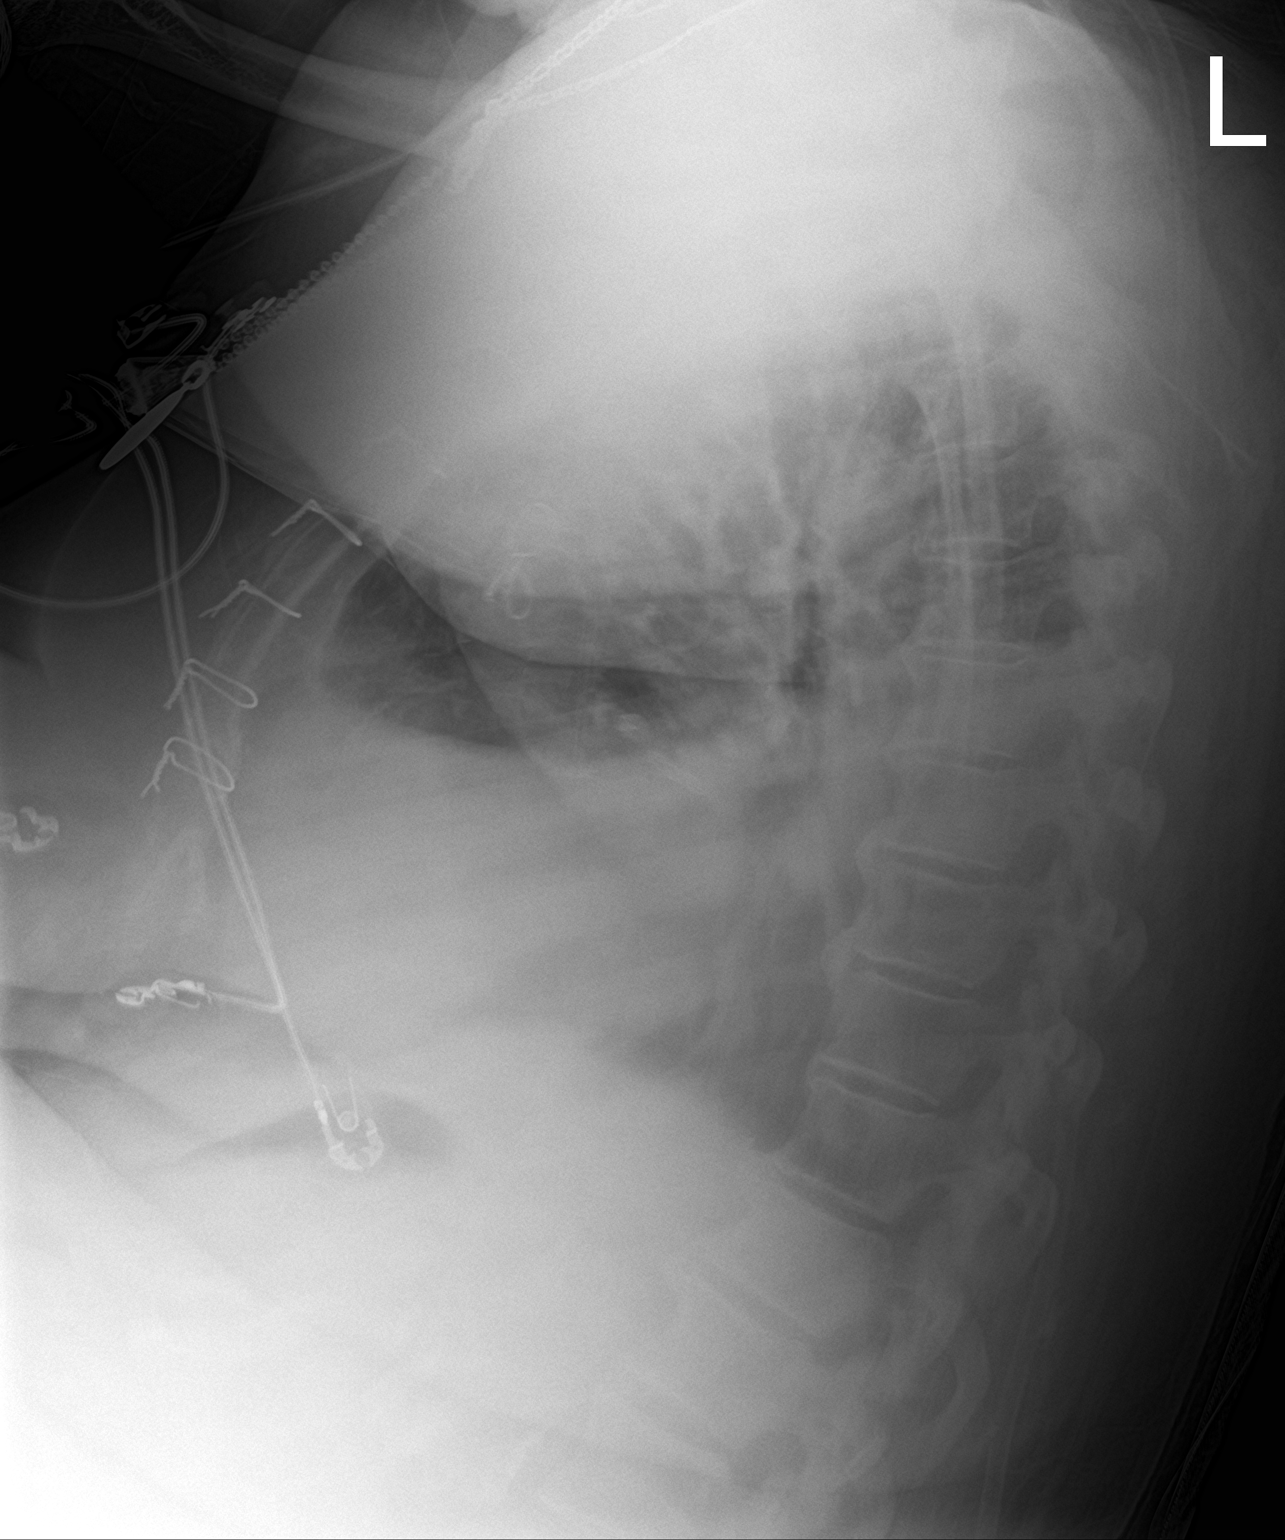

[chest ap]
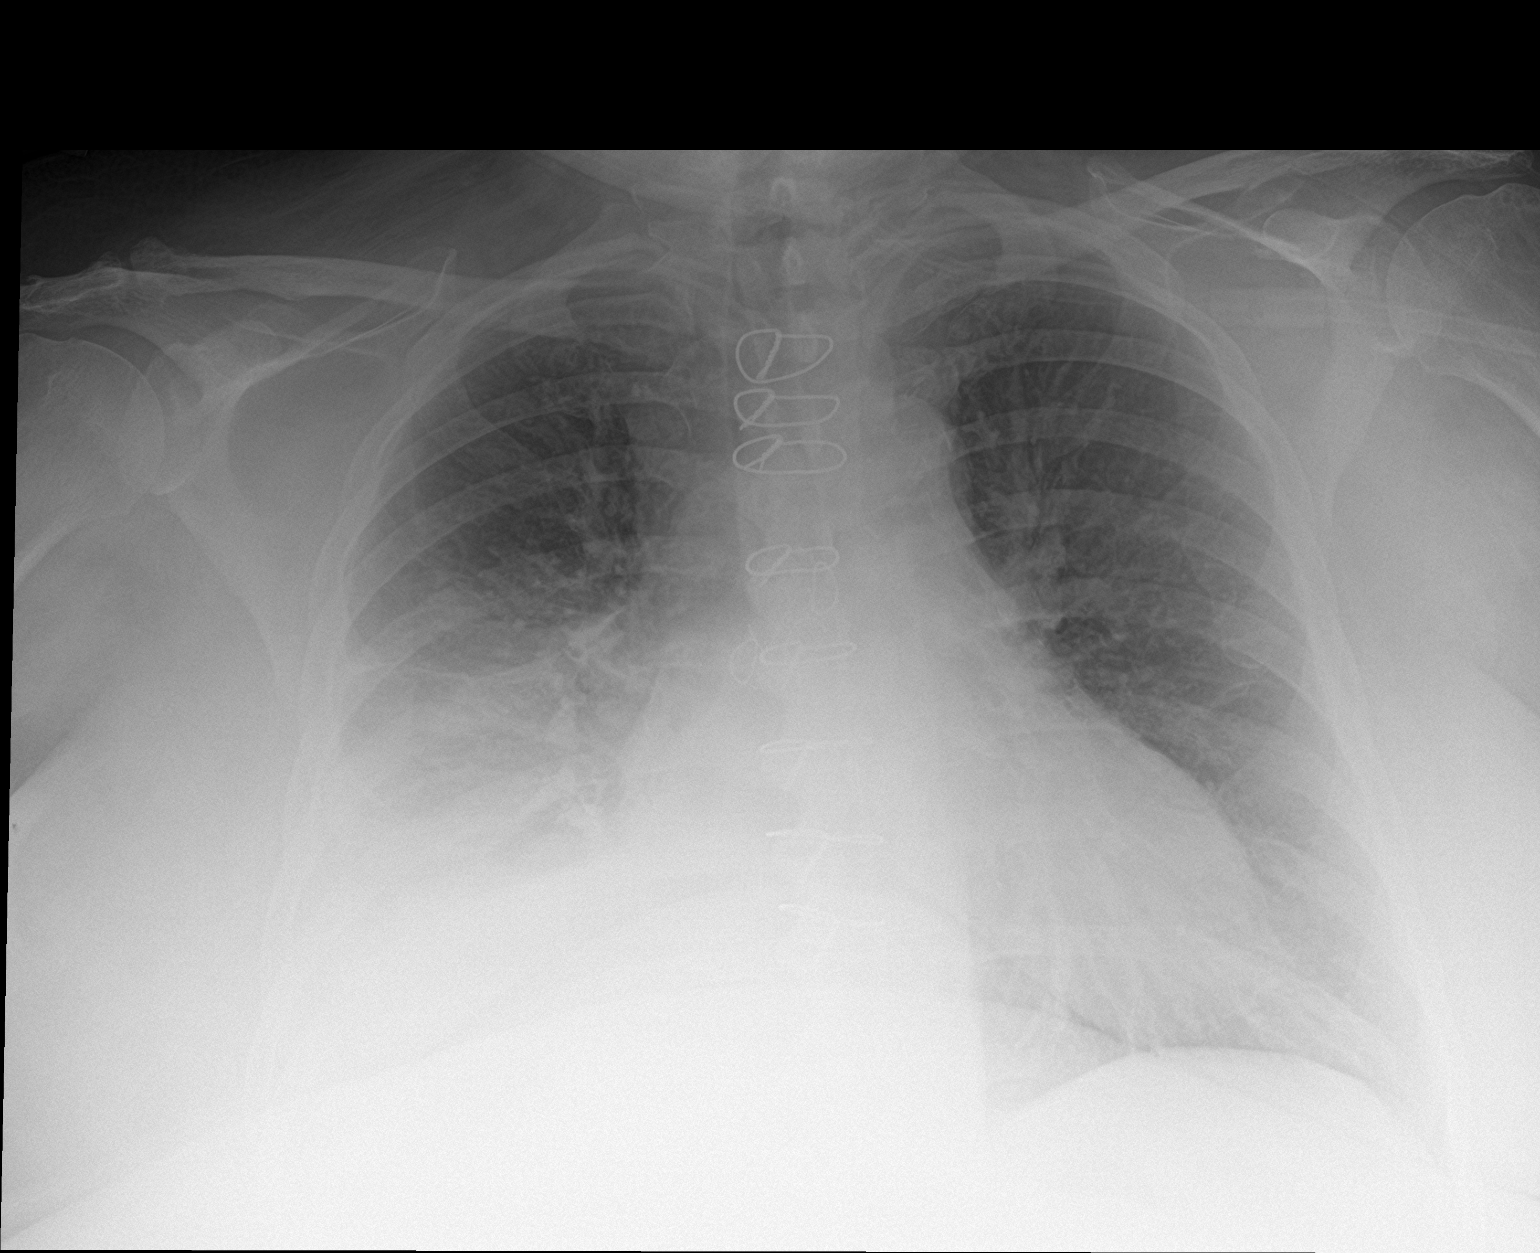

[2 of 2 positions shown; findings below may reference images not displayed]

FINDINGS: There is a moderate-sized right pleural effusion. There is no
pleural effusion on the left. The left lung is well-expanded. Both
lungs exhibit increased interstitial density. The cardiac silhouette
is enlarged. The pulmonary vascularity is engorged. The patient has
undergone previous median sternotomy and CABG. There is
calcification in the wall of the aortic arch. The observed bony
thorax is unremarkable.
IMPRESSION: COPD. CHF with pulmonary interstitial edema. Moderate-sized right
pleural effusion. There may be right basilar atelectasis or
pneumonia.

Thoracic aortic atherosclerosis.

## 2018-09-19 IMAGING — US US RENAL
1 series · 14 of 25 positions shown · non-contrast
Comparison: None.

CLINICAL DATA: Acute on chronic kidney injury.

EXAM:
RENAL / URINARY TRACT ULTRASOUND COMPLETE

[Series 1: us renal · 0.28mm/px · 14 of 28 slices shown]
[im 1/28]
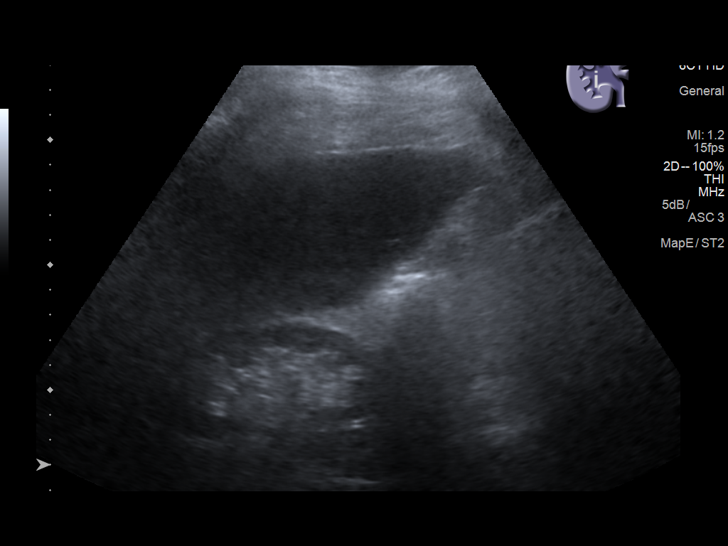
[im 3/28]
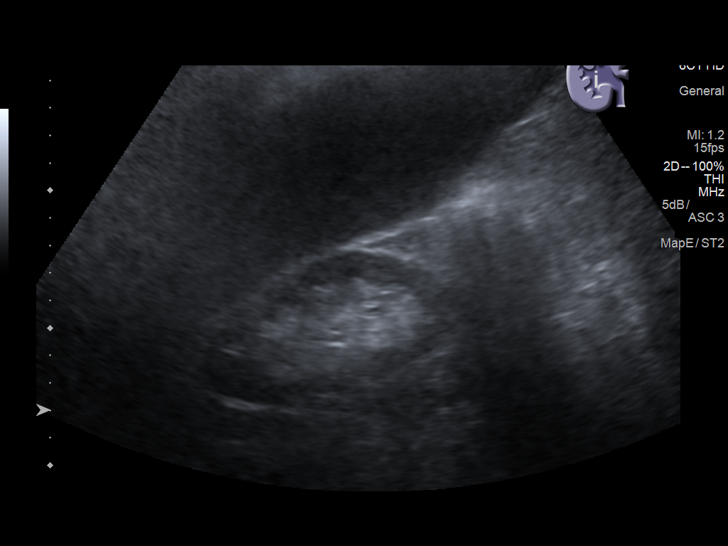
[im 5/28]
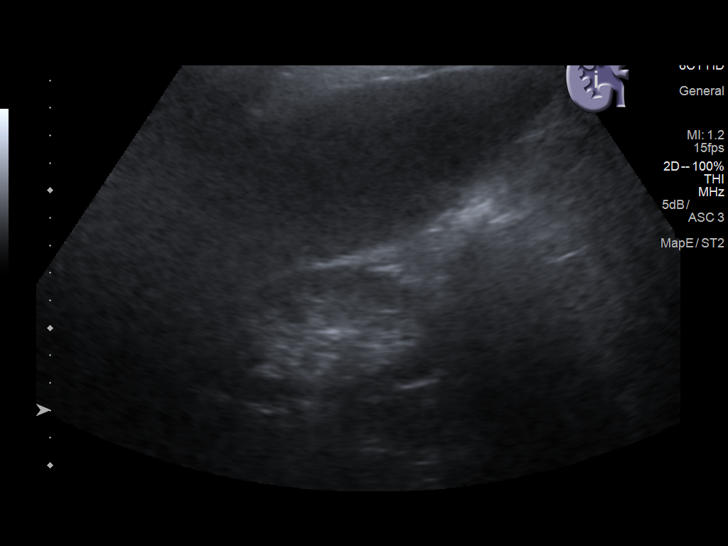
[im 7/28]
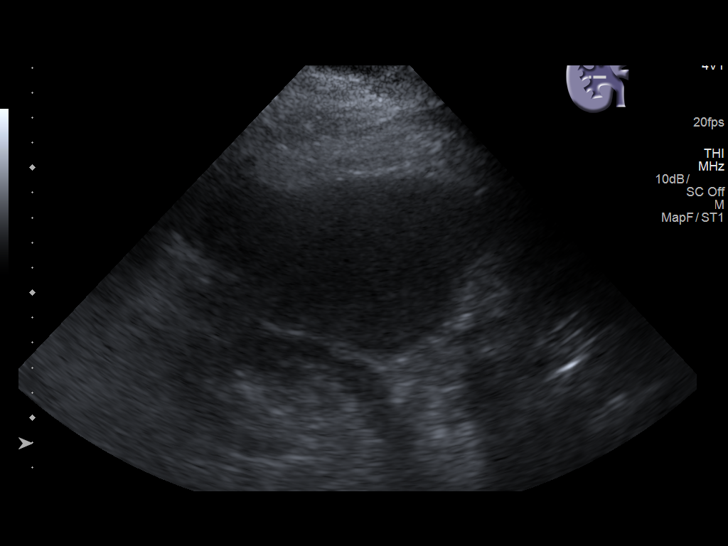
[im 10/28]
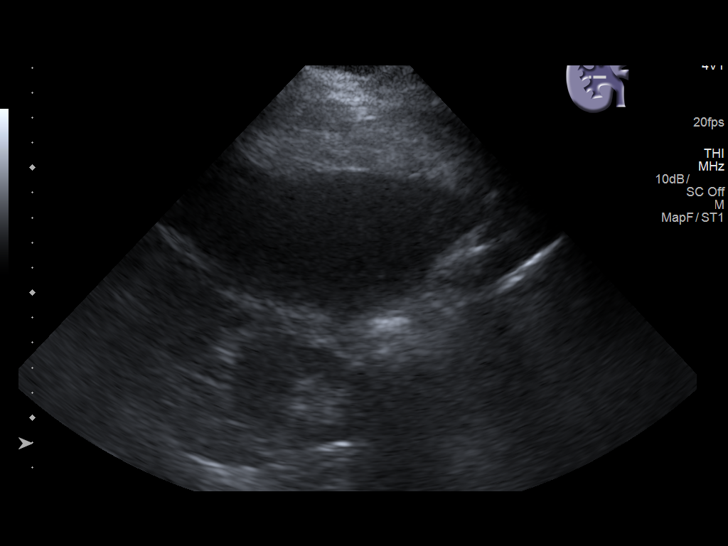
[im 11/28]
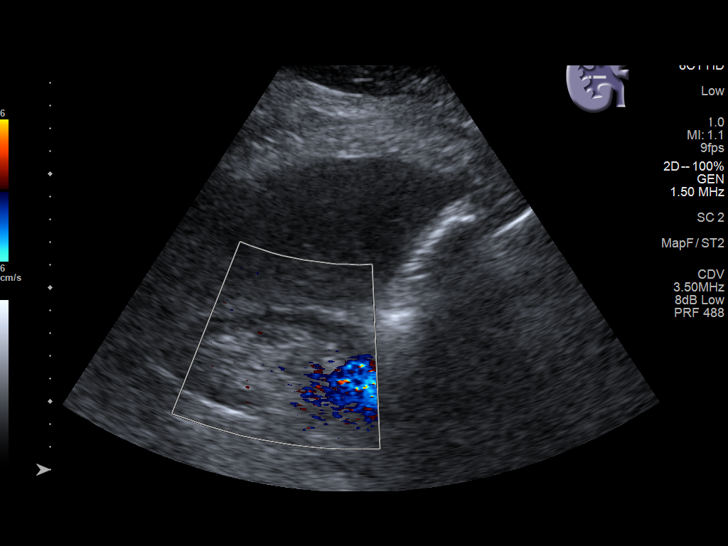
[im 13/28]
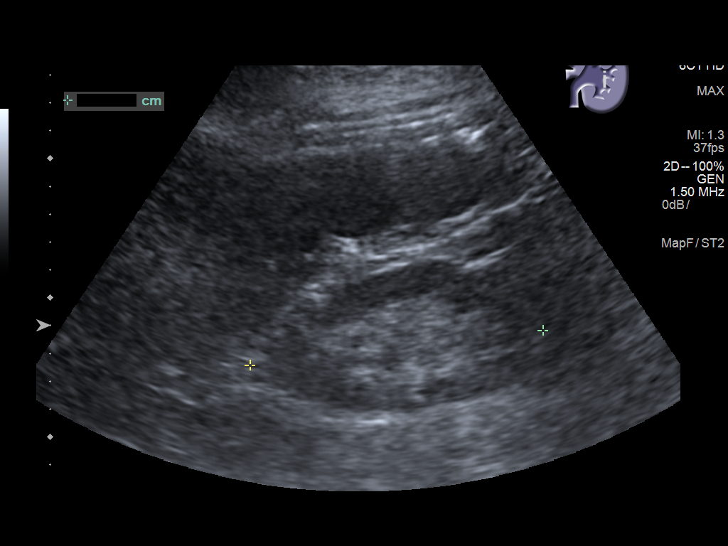
[im 15/28]
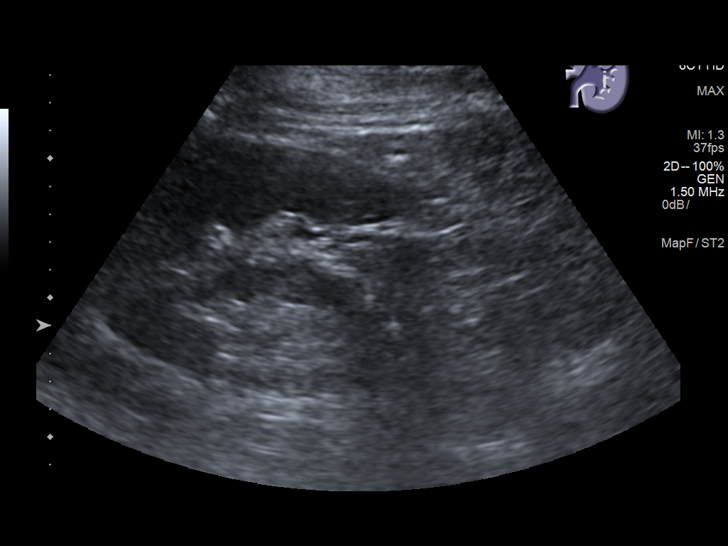
[im 17/28]
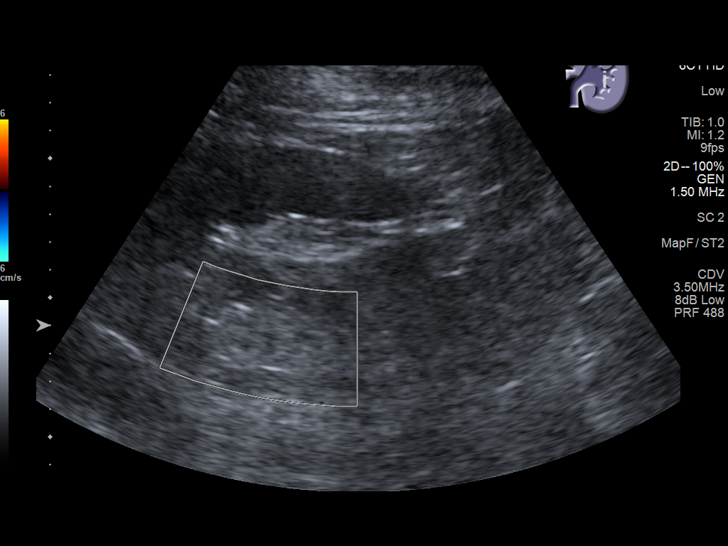
[im 19/28]
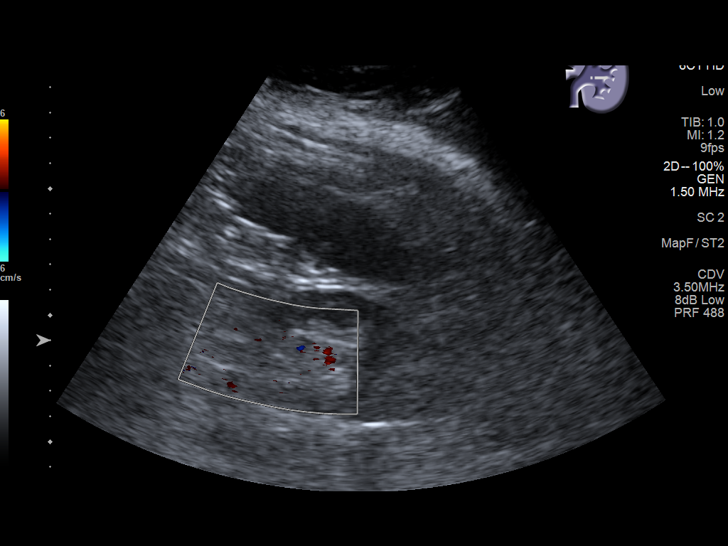
[im 21/28]
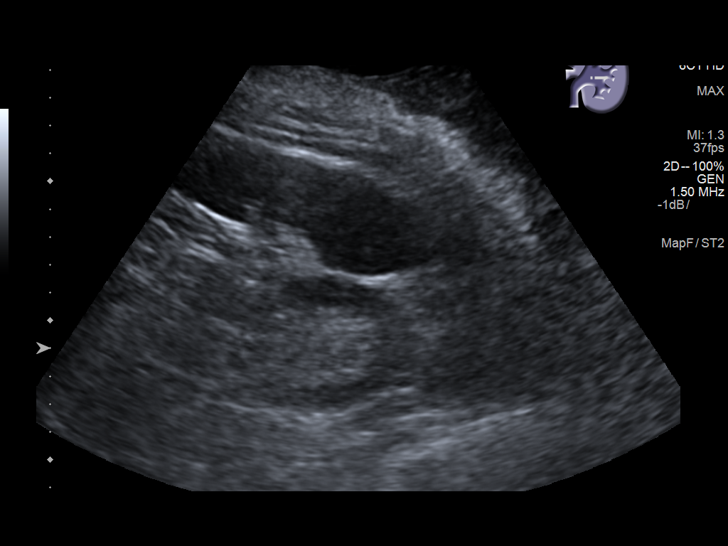
[im 23/28]
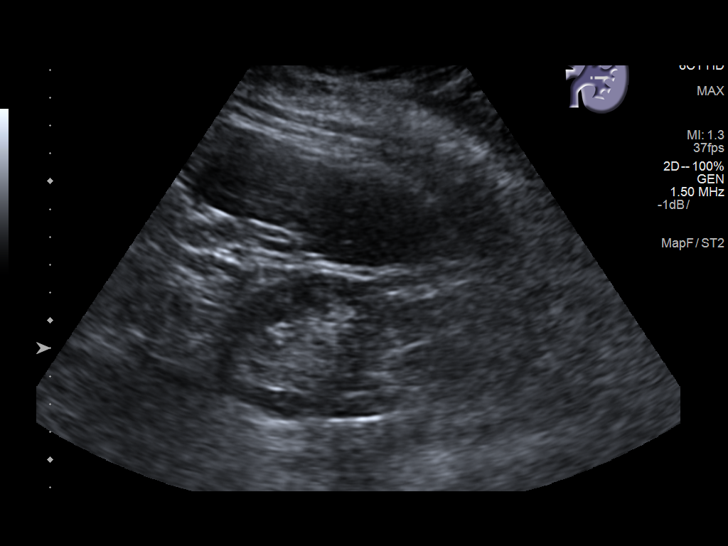
[im 25/28]
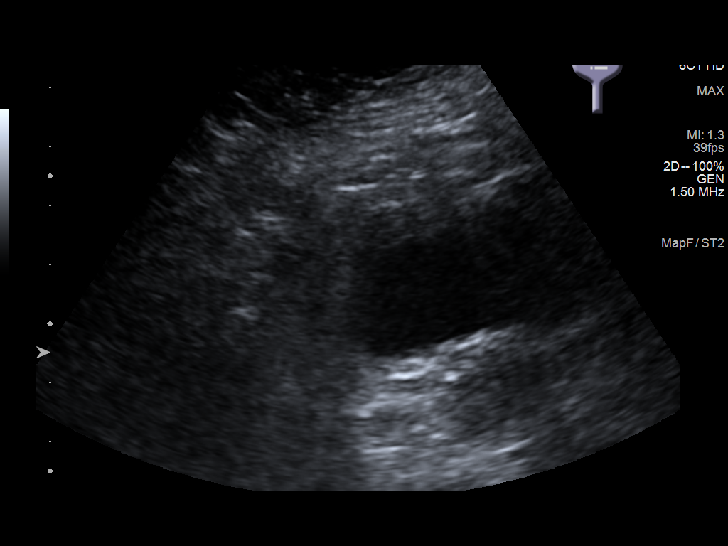
[im 28/28]
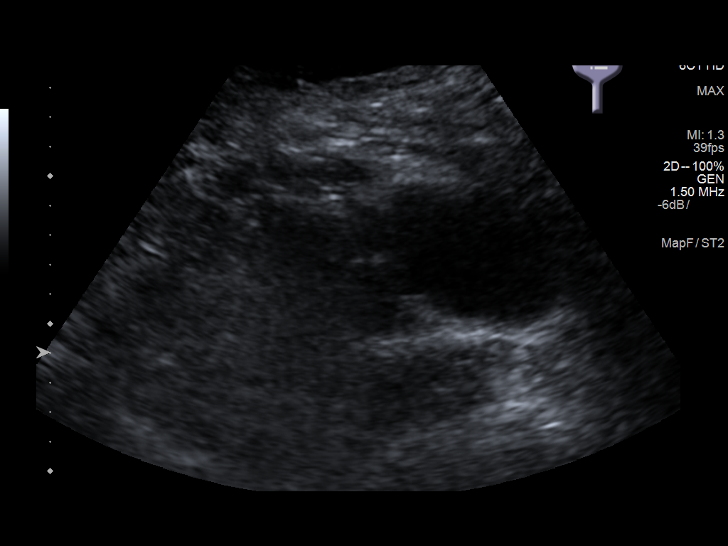

[14 of 25 positions shown; findings below may reference images not displayed]

FINDINGS: Right Kidney:

Length: 11.1 cm. Mildly increased echogenicity and moderate
parenchymal thinning, consistent with medical renal disease. No
hydronephrosis. No suspicious focal parenchymal lesion.

Left Kidney:

Length: 11.0 cm. Mildly increased echogenicity and moderate
parenchymal thinning, consistent with medical renal disease. No
hydronephrosis. No suspicious focal parenchymal lesion.

Bladder:

Appears normal for degree of bladder distention.
IMPRESSION: Atrophic appearing kidneys with increased echogenicity consistent
with medical renal disease. No hydronephrosis.
